# Patient Record
Sex: Female | Born: 1945 | Race: Black or African American | Hispanic: No | Marital: Single | Smoking: Current every day smoker
Health system: Southern US, Community
[De-identification: ages and names within clinical notes are randomized; demographics above are authoritative.]

## PROBLEM LIST (undated history)

## (undated) DIAGNOSIS — I10 Essential (primary) hypertension: Secondary | ICD-10-CM

## (undated) DIAGNOSIS — I2510 Atherosclerotic heart disease of native coronary artery without angina pectoris: Secondary | ICD-10-CM

## (undated) DIAGNOSIS — E785 Hyperlipidemia, unspecified: Secondary | ICD-10-CM

## (undated) DIAGNOSIS — E663 Overweight: Secondary | ICD-10-CM

## (undated) DIAGNOSIS — I6350 Cerebral infarction due to unspecified occlusion or stenosis of unspecified cerebral artery: Secondary | ICD-10-CM

## (undated) DIAGNOSIS — Z720 Tobacco use: Secondary | ICD-10-CM

## (undated) DIAGNOSIS — E119 Type 2 diabetes mellitus without complications: Secondary | ICD-10-CM

## (undated) HISTORY — PX: CHOLECYSTECTOMY: SHX55

## (undated) HISTORY — DX: Tobacco use: Z72.0

## (undated) HISTORY — DX: Overweight: E66.3

## (undated) HISTORY — PX: BACK SURGERY: SHX140

## (undated) HISTORY — DX: Cerebral infarction due to unspecified occlusion or stenosis of unspecified cerebral artery: I63.50

## (undated) HISTORY — DX: Essential (primary) hypertension: I10

## (undated) HISTORY — DX: Hyperlipidemia, unspecified: E78.5

## (undated) HISTORY — DX: Type 2 diabetes mellitus without complications: E11.9

## (undated) HISTORY — DX: Atherosclerotic heart disease of native coronary artery without angina pectoris: I25.10

## (undated) HISTORY — PX: ABDOMINAL HYSTERECTOMY: SHX81

---

## 2000-09-30 ENCOUNTER — Emergency Department (HOSPITAL_COMMUNITY): Admission: EM | Admit: 2000-09-30 | Discharge: 2000-09-30 | Payer: Self-pay | Admitting: Emergency Medicine

## 2001-11-11 ENCOUNTER — Ambulatory Visit (HOSPITAL_BASED_OUTPATIENT_CLINIC_OR_DEPARTMENT_OTHER): Admission: RE | Admit: 2001-11-11 | Discharge: 2001-11-11 | Payer: Self-pay | Admitting: Orthopedic Surgery

## 2001-12-23 ENCOUNTER — Encounter: Payer: Self-pay | Admitting: Emergency Medicine

## 2001-12-23 ENCOUNTER — Emergency Department (HOSPITAL_COMMUNITY): Admission: EM | Admit: 2001-12-23 | Discharge: 2001-12-23 | Payer: Self-pay | Admitting: Emergency Medicine

## 2002-03-18 HISTORY — PX: EXPLORATORY LAPAROTOMY: SUR591

## 2002-04-01 ENCOUNTER — Ambulatory Visit: Admission: RE | Admit: 2002-04-01 | Discharge: 2002-04-01 | Payer: Self-pay | Admitting: Gynecology

## 2002-04-04 ENCOUNTER — Encounter: Payer: Self-pay | Admitting: Gynecologic Oncology

## 2002-04-08 ENCOUNTER — Encounter (INDEPENDENT_AMBULATORY_CARE_PROVIDER_SITE_OTHER): Payer: Self-pay

## 2002-04-08 ENCOUNTER — Inpatient Hospital Stay (HOSPITAL_COMMUNITY): Admission: RE | Admit: 2002-04-08 | Discharge: 2002-04-11 | Payer: Self-pay | Admitting: Gynecologic Oncology

## 2002-04-09 ENCOUNTER — Encounter: Payer: Self-pay | Admitting: Internal Medicine

## 2002-04-15 ENCOUNTER — Encounter: Payer: Self-pay | Admitting: Emergency Medicine

## 2002-04-15 ENCOUNTER — Encounter (INDEPENDENT_AMBULATORY_CARE_PROVIDER_SITE_OTHER): Payer: Self-pay | Admitting: *Deleted

## 2002-04-15 ENCOUNTER — Inpatient Hospital Stay (HOSPITAL_COMMUNITY): Admission: EM | Admit: 2002-04-15 | Discharge: 2002-05-07 | Payer: Self-pay | Admitting: Emergency Medicine

## 2002-05-01 ENCOUNTER — Encounter: Payer: Self-pay | Admitting: General Surgery

## 2002-05-02 ENCOUNTER — Encounter: Payer: Self-pay | Admitting: General Surgery

## 2002-05-04 ENCOUNTER — Encounter: Payer: Self-pay | Admitting: General Surgery

## 2002-05-08 ENCOUNTER — Emergency Department (HOSPITAL_COMMUNITY): Admission: EM | Admit: 2002-05-08 | Discharge: 2002-05-08 | Payer: Self-pay | Admitting: Emergency Medicine

## 2003-07-17 ENCOUNTER — Emergency Department (HOSPITAL_COMMUNITY): Admission: EM | Admit: 2003-07-17 | Discharge: 2003-07-17 | Payer: Self-pay | Admitting: Emergency Medicine

## 2004-11-03 ENCOUNTER — Emergency Department (HOSPITAL_COMMUNITY): Admission: EM | Admit: 2004-11-03 | Discharge: 2004-11-03 | Payer: Self-pay | Admitting: *Deleted

## 2005-01-16 HISTORY — PX: CORONARY ARTERY BYPASS GRAFT: SHX141

## 2005-01-16 HISTORY — PX: CAROTID ENDARTERECTOMY: SUR193

## 2005-02-08 ENCOUNTER — Inpatient Hospital Stay (HOSPITAL_COMMUNITY): Admission: EM | Admit: 2005-02-08 | Discharge: 2005-02-18 | Payer: Self-pay | Admitting: Emergency Medicine

## 2005-02-14 ENCOUNTER — Encounter (INDEPENDENT_AMBULATORY_CARE_PROVIDER_SITE_OTHER): Payer: Self-pay | Admitting: *Deleted

## 2005-02-20 ENCOUNTER — Emergency Department (HOSPITAL_COMMUNITY): Admission: EM | Admit: 2005-02-20 | Discharge: 2005-02-20 | Payer: Self-pay | Admitting: Emergency Medicine

## 2005-08-30 ENCOUNTER — Inpatient Hospital Stay (HOSPITAL_COMMUNITY): Admission: EM | Admit: 2005-08-30 | Discharge: 2005-09-02 | Payer: Self-pay | Admitting: Emergency Medicine

## 2006-07-16 ENCOUNTER — Observation Stay (HOSPITAL_COMMUNITY): Admission: EM | Admit: 2006-07-16 | Discharge: 2006-07-17 | Payer: Self-pay | Admitting: Emergency Medicine

## 2007-06-17 ENCOUNTER — Encounter: Admission: RE | Admit: 2007-06-17 | Discharge: 2007-06-17 | Payer: Self-pay | Admitting: Internal Medicine

## 2007-07-01 ENCOUNTER — Emergency Department (HOSPITAL_COMMUNITY): Admission: EM | Admit: 2007-07-01 | Discharge: 2007-07-01 | Payer: Self-pay | Admitting: Family Medicine

## 2007-07-05 ENCOUNTER — Ambulatory Visit: Payer: Self-pay | Admitting: Vascular Surgery

## 2007-07-18 ENCOUNTER — Encounter (INDEPENDENT_AMBULATORY_CARE_PROVIDER_SITE_OTHER): Payer: Self-pay | Admitting: *Deleted

## 2007-07-22 ENCOUNTER — Ambulatory Visit: Payer: Self-pay | Admitting: Vascular Surgery

## 2007-07-22 ENCOUNTER — Ambulatory Visit (HOSPITAL_COMMUNITY): Admission: RE | Admit: 2007-07-22 | Discharge: 2007-07-22 | Payer: Self-pay | Admitting: Vascular Surgery

## 2007-11-11 ENCOUNTER — Emergency Department (HOSPITAL_COMMUNITY): Admission: EM | Admit: 2007-11-11 | Discharge: 2007-11-11 | Payer: Self-pay | Admitting: Emergency Medicine

## 2007-11-29 ENCOUNTER — Emergency Department (HOSPITAL_COMMUNITY): Admission: EM | Admit: 2007-11-29 | Discharge: 2007-11-29 | Payer: Self-pay | Admitting: Emergency Medicine

## 2008-05-01 ENCOUNTER — Telehealth (INDEPENDENT_AMBULATORY_CARE_PROVIDER_SITE_OTHER): Payer: Self-pay | Admitting: *Deleted

## 2008-05-04 ENCOUNTER — Telehealth (INDEPENDENT_AMBULATORY_CARE_PROVIDER_SITE_OTHER): Payer: Self-pay | Admitting: *Deleted

## 2008-05-11 ENCOUNTER — Ambulatory Visit: Payer: Self-pay | Admitting: *Deleted

## 2008-05-11 DIAGNOSIS — E785 Hyperlipidemia, unspecified: Secondary | ICD-10-CM | POA: Insufficient documentation

## 2008-05-11 HISTORY — DX: Hyperlipidemia, unspecified: E78.5

## 2008-05-11 LAB — CONVERTED CEMR LAB
ALT: 13 units/L (ref 0–35)
Calcium: 10 mg/dL (ref 8.4–10.5)
Chloride: 104 meq/L (ref 96–112)
Creatinine, Urine: 300 mg/dL
Glucose, Bld: 131 mg/dL — ABNORMAL HIGH (ref 70–99)
Microalb, Ur: 13.4 mg/dL — ABNORMAL HIGH (ref 0.00–1.89)
Potassium: 4.2 meq/L (ref 3.5–5.3)
Sodium: 142 meq/L (ref 135–145)
TSH: 1.772 microintl units/mL (ref 0.350–4.50)
Triglycerides: 134 mg/dL (ref <150)

## 2008-05-12 ENCOUNTER — Encounter (INDEPENDENT_AMBULATORY_CARE_PROVIDER_SITE_OTHER): Payer: Self-pay | Admitting: *Deleted

## 2008-05-15 DIAGNOSIS — E669 Obesity, unspecified: Secondary | ICD-10-CM

## 2008-05-15 DIAGNOSIS — E119 Type 2 diabetes mellitus without complications: Secondary | ICD-10-CM

## 2008-05-15 DIAGNOSIS — E1169 Type 2 diabetes mellitus with other specified complication: Secondary | ICD-10-CM | POA: Insufficient documentation

## 2008-05-15 DIAGNOSIS — I10 Essential (primary) hypertension: Secondary | ICD-10-CM

## 2008-05-15 DIAGNOSIS — I2510 Atherosclerotic heart disease of native coronary artery without angina pectoris: Secondary | ICD-10-CM

## 2008-05-15 HISTORY — DX: Atherosclerotic heart disease of native coronary artery without angina pectoris: I25.10

## 2008-05-15 HISTORY — DX: Essential (primary) hypertension: I10

## 2008-05-15 HISTORY — DX: Type 2 diabetes mellitus without complications: E11.9

## 2008-05-25 ENCOUNTER — Encounter (INDEPENDENT_AMBULATORY_CARE_PROVIDER_SITE_OTHER): Payer: Self-pay | Admitting: *Deleted

## 2008-05-28 ENCOUNTER — Ambulatory Visit: Payer: Self-pay | Admitting: *Deleted

## 2008-06-08 ENCOUNTER — Telehealth (INDEPENDENT_AMBULATORY_CARE_PROVIDER_SITE_OTHER): Payer: Self-pay | Admitting: *Deleted

## 2008-06-09 ENCOUNTER — Telehealth (INDEPENDENT_AMBULATORY_CARE_PROVIDER_SITE_OTHER): Payer: Self-pay | Admitting: *Deleted

## 2008-06-23 ENCOUNTER — Encounter (INDEPENDENT_AMBULATORY_CARE_PROVIDER_SITE_OTHER): Payer: Self-pay | Admitting: *Deleted

## 2008-06-30 ENCOUNTER — Ambulatory Visit: Payer: Self-pay | Admitting: *Deleted

## 2008-06-30 DIAGNOSIS — I4581 Long QT syndrome: Secondary | ICD-10-CM | POA: Insufficient documentation

## 2008-06-30 LAB — CONVERTED CEMR LAB
AST: 13 units/L (ref 0–37)
Alkaline Phosphatase: 76 units/L (ref 39–117)
BUN: 12 mg/dL (ref 6–23)
CO2: 25 meq/L (ref 19–32)
Calcium: 9 mg/dL (ref 8.4–10.5)
Creatinine, Ser: 0.84 mg/dL (ref 0.40–1.20)
HCT: 38.5 % (ref 36.0–46.0)
Hemoglobin: 12.3 g/dL (ref 12.0–15.0)
MCV: 98.2 fL (ref 78.0–100.0)
RBC: 3.92 M/uL (ref 3.87–5.11)
Total Protein: 6.4 g/dL (ref 6.0–8.3)

## 2008-07-10 ENCOUNTER — Telehealth: Payer: Self-pay | Admitting: *Deleted

## 2008-07-16 ENCOUNTER — Inpatient Hospital Stay (HOSPITAL_COMMUNITY): Admission: RE | Admit: 2008-07-16 | Discharge: 2008-07-20 | Payer: Self-pay | Admitting: Orthopedic Surgery

## 2008-07-21 ENCOUNTER — Telehealth (INDEPENDENT_AMBULATORY_CARE_PROVIDER_SITE_OTHER): Payer: Self-pay | Admitting: *Deleted

## 2008-10-20 ENCOUNTER — Ambulatory Visit: Payer: Self-pay | Admitting: *Deleted

## 2008-10-20 DIAGNOSIS — G609 Hereditary and idiopathic neuropathy, unspecified: Secondary | ICD-10-CM | POA: Insufficient documentation

## 2008-10-20 LAB — CONVERTED CEMR LAB
Blood Glucose, Fingerstick: 179
Hgb A1c MFr Bld: 5.9 %

## 2008-10-21 LAB — CONVERTED CEMR LAB
Basophils Relative: 0 % (ref 0–1)
Chloride: 108 meq/L (ref 96–112)
Creatinine, Ser: 0.77 mg/dL (ref 0.40–1.20)
Eosinophils Absolute: 0 10*3/uL (ref 0.0–0.7)
Eosinophils Relative: 0 % (ref 0–5)
HCT: 38.9 % (ref 36.0–46.0)
Lymphs Abs: 2.1 10*3/uL (ref 0.7–4.0)
MCHC: 31.1 g/dL (ref 30.0–36.0)
MCV: 94.6 fL (ref 78.0–100.0)
Neutro Abs: 6.9 10*3/uL (ref 1.7–7.7)
Neutrophils Relative %: 70 % (ref 43–77)
Platelets: 311 10*3/uL (ref 150–400)
Potassium: 4.1 meq/L (ref 3.5–5.3)
RBC: 4.11 M/uL (ref 3.87–5.11)
RDW: 16.5 % — ABNORMAL HIGH (ref 11.5–15.5)
Sodium: 144 meq/L (ref 135–145)

## 2008-11-03 ENCOUNTER — Ambulatory Visit: Payer: Self-pay | Admitting: *Deleted

## 2008-11-03 LAB — CONVERTED CEMR LAB: OCCULT 1: NEGATIVE

## 2008-11-04 LAB — CONVERTED CEMR LAB
Calcium: 9.7 mg/dL (ref 8.4–10.5)
Chloride: 105 meq/L (ref 96–112)
Sodium: 141 meq/L (ref 135–145)

## 2008-11-05 ENCOUNTER — Encounter (INDEPENDENT_AMBULATORY_CARE_PROVIDER_SITE_OTHER): Payer: Self-pay | Admitting: *Deleted

## 2008-11-17 ENCOUNTER — Telehealth: Payer: Self-pay | Admitting: *Deleted

## 2008-12-10 ENCOUNTER — Telehealth: Payer: Self-pay | Admitting: *Deleted

## 2008-12-14 ENCOUNTER — Encounter (INDEPENDENT_AMBULATORY_CARE_PROVIDER_SITE_OTHER): Payer: Self-pay | Admitting: *Deleted

## 2008-12-14 ENCOUNTER — Ambulatory Visit: Payer: Self-pay | Admitting: Internal Medicine

## 2008-12-14 LAB — CONVERTED CEMR LAB
CO2: 23 meq/L (ref 19–32)
Calcium: 9.8 mg/dL (ref 8.4–10.5)
Chloride: 107 meq/L (ref 96–112)
Glucose, Bld: 125 mg/dL — ABNORMAL HIGH (ref 70–99)
Sodium: 143 meq/L (ref 135–145)

## 2009-04-21 ENCOUNTER — Telehealth: Payer: Self-pay | Admitting: *Deleted

## 2009-04-21 ENCOUNTER — Telehealth: Payer: Self-pay | Admitting: Internal Medicine

## 2009-04-21 ENCOUNTER — Ambulatory Visit: Payer: Self-pay | Admitting: Internal Medicine

## 2009-04-21 LAB — CONVERTED CEMR LAB: Blood Glucose, Fingerstick: 233

## 2009-04-22 ENCOUNTER — Ambulatory Visit: Payer: Self-pay | Admitting: Internal Medicine

## 2009-04-22 ENCOUNTER — Telehealth: Payer: Self-pay | Admitting: *Deleted

## 2009-04-22 ENCOUNTER — Encounter: Payer: Self-pay | Admitting: Internal Medicine

## 2009-04-22 LAB — CONVERTED CEMR LAB
AST: 15 units/L (ref 0–37)
Alkaline Phosphatase: 125 units/L — ABNORMAL HIGH (ref 39–117)
BUN: 21 mg/dL (ref 6–23)
Bilirubin, Direct: 0.1 mg/dL (ref 0.0–0.3)
Blood Glucose, Fingerstick: 312
Chloride: 95 meq/L — ABNORMAL LOW (ref 96–112)
Indirect Bilirubin: 0.5 mg/dL (ref 0.0–0.9)
Magnesium: 2 mg/dL (ref 1.5–2.5)
Potassium: 4.4 meq/L (ref 3.5–5.3)
Sodium: 136 meq/L (ref 135–145)
Total Bilirubin: 0.6 mg/dL (ref 0.3–1.2)

## 2009-05-03 ENCOUNTER — Ambulatory Visit: Payer: Self-pay | Admitting: Internal Medicine

## 2009-05-05 DIAGNOSIS — N189 Chronic kidney disease, unspecified: Secondary | ICD-10-CM | POA: Insufficient documentation

## 2009-05-06 LAB — CONVERTED CEMR LAB
BUN: 30 mg/dL — ABNORMAL HIGH (ref 6–23)
Calcium: 10.1 mg/dL (ref 8.4–10.5)
Creatinine, Ser: 1.29 mg/dL — ABNORMAL HIGH (ref 0.40–1.20)
HCT: 40.7 % (ref 36.0–46.0)
HDL: 36 mg/dL — ABNORMAL LOW (ref >39)
Hemoglobin: 13.3 g/dL (ref 12.0–15.0)
LDL Cholesterol: 87 mg/dL (ref 0–99)
MCHC: 32.7 g/dL (ref 30.0–36.0)
MCV: 98.1 fL (ref >=78.0)
Platelets: 332 10*3/uL (ref 150–400)
RDW: 12.3 % (ref 11.5–15.5)
Triglycerides: 345 mg/dL — ABNORMAL HIGH (ref <150)

## 2009-05-10 ENCOUNTER — Encounter: Payer: Self-pay | Admitting: Internal Medicine

## 2009-05-11 ENCOUNTER — Telehealth: Payer: Self-pay | Admitting: *Deleted

## 2009-05-14 ENCOUNTER — Encounter: Payer: Self-pay | Admitting: Internal Medicine

## 2009-05-17 ENCOUNTER — Ambulatory Visit: Payer: Self-pay | Admitting: Internal Medicine

## 2009-05-17 ENCOUNTER — Encounter: Payer: Self-pay | Admitting: Internal Medicine

## 2009-05-17 LAB — CONVERTED CEMR LAB
CO2: 24 meq/L (ref 19–32)
Calcium: 9.7 mg/dL (ref 8.4–10.5)
Glucose, Bld: 188 mg/dL — ABNORMAL HIGH (ref 70–99)
Magnesium: 1.9 mg/dL (ref 1.5–2.5)
Potassium: 4.5 meq/L (ref 3.5–5.3)
Sodium: 142 meq/L (ref 135–145)

## 2009-05-23 ENCOUNTER — Inpatient Hospital Stay (HOSPITAL_COMMUNITY): Admission: EM | Admit: 2009-05-23 | Discharge: 2009-05-25 | Payer: Self-pay | Admitting: Emergency Medicine

## 2009-05-27 ENCOUNTER — Ambulatory Visit: Payer: Self-pay | Admitting: Infectious Diseases

## 2009-05-27 LAB — CONVERTED CEMR LAB
CO2: 23 meq/L (ref 19–32)
Calcium: 9.6 mg/dL (ref 8.4–10.5)
Chloride: 104 meq/L (ref 96–112)
Creatinine, Ser: 1.08 mg/dL (ref 0.40–1.20)
Glucose, Bld: 78 mg/dL (ref 70–99)
Sodium: 142 meq/L (ref 135–145)

## 2009-06-23 ENCOUNTER — Telehealth: Payer: Self-pay | Admitting: Internal Medicine

## 2009-06-28 ENCOUNTER — Ambulatory Visit: Payer: Self-pay | Admitting: Internal Medicine

## 2009-06-30 ENCOUNTER — Telehealth (INDEPENDENT_AMBULATORY_CARE_PROVIDER_SITE_OTHER): Payer: Self-pay | Admitting: *Deleted

## 2009-07-15 ENCOUNTER — Encounter (INDEPENDENT_AMBULATORY_CARE_PROVIDER_SITE_OTHER): Payer: Self-pay | Admitting: Internal Medicine

## 2009-07-15 ENCOUNTER — Ambulatory Visit: Payer: Self-pay | Admitting: Internal Medicine

## 2009-09-15 ENCOUNTER — Encounter: Payer: Self-pay | Admitting: Internal Medicine

## 2009-09-15 ENCOUNTER — Inpatient Hospital Stay (HOSPITAL_COMMUNITY): Admission: EM | Admit: 2009-09-15 | Discharge: 2009-09-17 | Payer: Self-pay | Admitting: Emergency Medicine

## 2009-09-15 ENCOUNTER — Ambulatory Visit: Payer: Self-pay | Admitting: Vascular Surgery

## 2009-09-15 ENCOUNTER — Ambulatory Visit: Payer: Self-pay | Admitting: Infectious Diseases

## 2009-09-20 ENCOUNTER — Encounter: Payer: Self-pay | Admitting: Internal Medicine

## 2009-09-23 ENCOUNTER — Ambulatory Visit: Payer: Self-pay | Admitting: Internal Medicine

## 2009-09-23 DIAGNOSIS — I6350 Cerebral infarction due to unspecified occlusion or stenosis of unspecified cerebral artery: Secondary | ICD-10-CM | POA: Insufficient documentation

## 2009-09-23 HISTORY — DX: Cerebral infarction due to unspecified occlusion or stenosis of unspecified cerebral artery: I63.50

## 2009-09-23 LAB — CONVERTED CEMR LAB: Blood Glucose, Fingerstick: 195

## 2009-09-27 ENCOUNTER — Telehealth (INDEPENDENT_AMBULATORY_CARE_PROVIDER_SITE_OTHER): Payer: Self-pay | Admitting: Internal Medicine

## 2009-10-07 ENCOUNTER — Ambulatory Visit: Payer: Self-pay | Admitting: Internal Medicine

## 2009-10-07 LAB — CONVERTED CEMR LAB: Hgb A1c MFr Bld: 8.2 %

## 2009-10-08 ENCOUNTER — Encounter: Payer: Self-pay | Admitting: Licensed Clinical Social Worker

## 2009-10-08 ENCOUNTER — Telehealth: Payer: Self-pay | Admitting: Licensed Clinical Social Worker

## 2009-10-08 ENCOUNTER — Encounter (INDEPENDENT_AMBULATORY_CARE_PROVIDER_SITE_OTHER): Payer: Self-pay | Admitting: Internal Medicine

## 2009-10-11 ENCOUNTER — Encounter (INDEPENDENT_AMBULATORY_CARE_PROVIDER_SITE_OTHER): Payer: Self-pay | Admitting: Internal Medicine

## 2009-10-11 ENCOUNTER — Telehealth: Payer: Self-pay | Admitting: Licensed Clinical Social Worker

## 2009-10-14 ENCOUNTER — Telehealth (INDEPENDENT_AMBULATORY_CARE_PROVIDER_SITE_OTHER): Payer: Self-pay | Admitting: Internal Medicine

## 2009-10-14 LAB — CONVERTED CEMR LAB
ALT: 14 units/L (ref 0–35)
AST: 14 units/L (ref 0–37)
Albumin: 4.5 g/dL (ref 3.5–5.2)
Alkaline Phosphatase: 102 units/L (ref 39–117)
Calcium: 9.8 mg/dL (ref 8.4–10.5)
Chloride: 103 meq/L (ref 96–112)
LDL Cholesterol: 156 mg/dL — ABNORMAL HIGH (ref 0–99)
Potassium: 4.4 meq/L (ref 3.5–5.3)
Sodium: 140 meq/L (ref 135–145)
Total Protein: 7.5 g/dL (ref 6.0–8.3)

## 2009-10-15 ENCOUNTER — Encounter (INDEPENDENT_AMBULATORY_CARE_PROVIDER_SITE_OTHER): Payer: Self-pay | Admitting: Internal Medicine

## 2009-10-18 ENCOUNTER — Encounter (INDEPENDENT_AMBULATORY_CARE_PROVIDER_SITE_OTHER): Payer: Self-pay | Admitting: Internal Medicine

## 2009-10-22 ENCOUNTER — Ambulatory Visit: Payer: Self-pay | Admitting: Internal Medicine

## 2009-10-27 ENCOUNTER — Telehealth (INDEPENDENT_AMBULATORY_CARE_PROVIDER_SITE_OTHER): Payer: Self-pay | Admitting: Internal Medicine

## 2009-10-29 ENCOUNTER — Encounter (INDEPENDENT_AMBULATORY_CARE_PROVIDER_SITE_OTHER): Payer: Self-pay | Admitting: Internal Medicine

## 2009-11-01 ENCOUNTER — Encounter (INDEPENDENT_AMBULATORY_CARE_PROVIDER_SITE_OTHER): Payer: Self-pay | Admitting: Internal Medicine

## 2009-11-05 ENCOUNTER — Encounter (INDEPENDENT_AMBULATORY_CARE_PROVIDER_SITE_OTHER): Payer: Self-pay | Admitting: Internal Medicine

## 2009-11-12 ENCOUNTER — Encounter (INDEPENDENT_AMBULATORY_CARE_PROVIDER_SITE_OTHER): Payer: Self-pay | Admitting: Internal Medicine

## 2009-11-22 ENCOUNTER — Ambulatory Visit: Payer: Self-pay | Admitting: Internal Medicine

## 2009-11-22 ENCOUNTER — Encounter: Payer: Self-pay | Admitting: Internal Medicine

## 2009-11-22 ENCOUNTER — Inpatient Hospital Stay (HOSPITAL_COMMUNITY): Admission: AD | Admit: 2009-11-22 | Discharge: 2009-11-23 | Payer: Self-pay | Admitting: Internal Medicine

## 2009-11-22 ENCOUNTER — Ambulatory Visit: Payer: Self-pay | Admitting: Infectious Diseases

## 2009-11-22 LAB — CONVERTED CEMR LAB
Blood Glucose, Fingerstick: 215
Hgb A1c MFr Bld: 8.5 %

## 2009-11-23 ENCOUNTER — Encounter: Payer: Self-pay | Admitting: Internal Medicine

## 2009-12-15 ENCOUNTER — Telehealth (INDEPENDENT_AMBULATORY_CARE_PROVIDER_SITE_OTHER): Payer: Self-pay | Admitting: *Deleted

## 2009-12-17 ENCOUNTER — Ambulatory Visit (HOSPITAL_COMMUNITY): Admission: RE | Admit: 2009-12-17 | Discharge: 2009-12-17 | Payer: Self-pay | Admitting: Internal Medicine

## 2009-12-17 ENCOUNTER — Ambulatory Visit: Payer: Self-pay | Admitting: Internal Medicine

## 2009-12-17 LAB — CONVERTED CEMR LAB
Basophils Relative: 1 % (ref 0–1)
CO2: 27 meq/L (ref 19–32)
Calcium: 9.7 mg/dL (ref 8.4–10.5)
Hemoglobin: 11.4 g/dL — ABNORMAL LOW (ref 12.0–15.0)
Lymphocytes Relative: 17 % (ref 12–46)
Lymphs Abs: 1.9 10*3/uL (ref 0.7–4.0)
MCHC: 33.7 g/dL (ref 30.0–36.0)
Monocytes Relative: 6 % (ref 3–12)
Neutro Abs: 8.4 10*3/uL — ABNORMAL HIGH (ref 1.7–7.7)
Neutrophils Relative %: 76 % (ref 43–77)
Potassium: 4.2 meq/L (ref 3.5–5.3)
RBC: 3.44 M/uL — ABNORMAL LOW (ref 3.87–5.11)
Sodium: 140 meq/L (ref 135–145)
WBC: 11 10*3/uL — ABNORMAL HIGH (ref 4.0–10.5)

## 2009-12-28 ENCOUNTER — Ambulatory Visit: Payer: Self-pay | Admitting: Internal Medicine

## 2009-12-28 ENCOUNTER — Encounter (INDEPENDENT_AMBULATORY_CARE_PROVIDER_SITE_OTHER): Payer: Self-pay | Admitting: Internal Medicine

## 2009-12-30 ENCOUNTER — Telehealth: Payer: Self-pay | Admitting: *Deleted

## 2009-12-30 LAB — CONVERTED CEMR LAB
Cholesterol: 199 mg/dL (ref 0–200)
Creatinine, Urine: 171.4 mg/dL
HDL: 36 mg/dL — ABNORMAL LOW (ref >39)
Microalb Creat Ratio: 11.7 mg/g (ref 0.0–30.0)

## 2010-01-12 ENCOUNTER — Telehealth (INDEPENDENT_AMBULATORY_CARE_PROVIDER_SITE_OTHER): Payer: Self-pay | Admitting: Internal Medicine

## 2010-01-13 ENCOUNTER — Telehealth (INDEPENDENT_AMBULATORY_CARE_PROVIDER_SITE_OTHER): Payer: Self-pay | Admitting: Internal Medicine

## 2010-01-17 ENCOUNTER — Ambulatory Visit: Payer: Self-pay | Admitting: Internal Medicine

## 2010-01-17 ENCOUNTER — Telehealth (INDEPENDENT_AMBULATORY_CARE_PROVIDER_SITE_OTHER): Payer: Self-pay | Admitting: Internal Medicine

## 2010-01-19 ENCOUNTER — Encounter (INDEPENDENT_AMBULATORY_CARE_PROVIDER_SITE_OTHER): Payer: Self-pay | Admitting: Internal Medicine

## 2010-01-19 ENCOUNTER — Telehealth (INDEPENDENT_AMBULATORY_CARE_PROVIDER_SITE_OTHER): Payer: Self-pay | Admitting: Internal Medicine

## 2010-02-24 ENCOUNTER — Ambulatory Visit: Payer: Self-pay | Admitting: Internal Medicine

## 2010-02-24 LAB — CONVERTED CEMR LAB
ALT: 14 units/L (ref 0–35)
AST: 12 units/L (ref 0–37)
Albumin: 4.6 g/dL (ref 3.5–5.2)
BUN: 13 mg/dL (ref 6–23)
Blood Glucose, Fingerstick: 92
Calcium: 9.9 mg/dL (ref 8.4–10.5)
Chloride: 106 meq/L (ref 96–112)
Hgb A1c MFr Bld: 6.5 %
Potassium: 4.2 meq/L (ref 3.5–5.3)

## 2010-03-03 ENCOUNTER — Telehealth: Payer: Self-pay | Admitting: Internal Medicine

## 2010-03-10 ENCOUNTER — Ambulatory Visit: Payer: Self-pay | Admitting: Internal Medicine

## 2010-03-17 ENCOUNTER — Emergency Department (HOSPITAL_COMMUNITY): Admission: EM | Admit: 2010-03-17 | Discharge: 2010-03-17 | Payer: Self-pay | Admitting: Emergency Medicine

## 2010-03-17 ENCOUNTER — Encounter: Payer: Self-pay | Admitting: Internal Medicine

## 2010-03-24 ENCOUNTER — Telehealth: Payer: Self-pay | Admitting: *Deleted

## 2010-03-25 ENCOUNTER — Telehealth: Payer: Self-pay | Admitting: Internal Medicine

## 2010-04-06 ENCOUNTER — Ambulatory Visit: Payer: Self-pay | Admitting: Internal Medicine

## 2010-04-06 ENCOUNTER — Encounter: Payer: Self-pay | Admitting: Ophthalmology

## 2010-04-06 ENCOUNTER — Observation Stay (HOSPITAL_COMMUNITY): Admission: EM | Admit: 2010-04-06 | Discharge: 2010-04-08 | Payer: Self-pay | Admitting: Emergency Medicine

## 2010-04-08 ENCOUNTER — Encounter: Payer: Self-pay | Admitting: Internal Medicine

## 2010-04-08 ENCOUNTER — Ambulatory Visit: Payer: Self-pay | Admitting: Vascular Surgery

## 2010-04-11 ENCOUNTER — Encounter: Payer: Self-pay | Admitting: Internal Medicine

## 2010-04-20 ENCOUNTER — Ambulatory Visit: Payer: Self-pay | Admitting: Internal Medicine

## 2010-04-20 LAB — CONVERTED CEMR LAB: Blood Glucose, Fingerstick: 173

## 2010-04-27 ENCOUNTER — Emergency Department (HOSPITAL_COMMUNITY): Admission: EM | Admit: 2010-04-27 | Discharge: 2010-04-27 | Payer: Self-pay | Admitting: Emergency Medicine

## 2010-05-11 ENCOUNTER — Encounter: Payer: Self-pay | Admitting: Internal Medicine

## 2010-06-06 ENCOUNTER — Encounter: Payer: Self-pay | Admitting: Internal Medicine

## 2010-10-09 ENCOUNTER — Encounter: Payer: Self-pay | Admitting: Orthopedic Surgery

## 2010-10-18 NOTE — Miscellaneous (Signed)
Summary: [facility]   Imported By: Margie Billet 12/17/2009 10:45:49  _____________________________________________________________________  External Attachment:    Type:   Image     Comment:   External Document

## 2010-10-18 NOTE — Assessment & Plan Note (Signed)
Summary: 2WK F/U/EST/VS   Vital Signs:  Patient profile:   65 year old female Height:      66 inches (167.64 cm) Weight:      201.9 pounds (91.77 kg) BMI:     32.71 Temp:     98.2 degrees F (36.78 degrees C) oral Pulse rate:   97 / minute BP sitting:   145 / 79  (right arm) Cuff size:   large  Vitals Entered By: Krystal Eaton Duncan Dull) (October 22, 2009 2:05 PM) CC: 2wk reck Is Patient Diabetic? Yes Did you bring your meter with you today? Yes Nutritional Status BMI of > 30 = obese  Have you ever been in a relationship where you felt threatened, hurt or afraid?No   Does patient need assistance? Functional Status Cook/clean, Shopping, Social activities Ambulation Impaired:Risk for fall Comments s/p stroke uses a cane   Primary Care Provider:  Mariea Stable MD  CC:  2wk reck.  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  She reports that she is doing well.  She has gotten the prescription for plavix filled.  She has started PT, OT and ST at her house.  Only concern today is that cbg's at have been high.  Depression History:      The patient denies a depressed mood most of the day and a diminished interest in her usual daily activities.        Comments:  somewhat depressed at times-not able to do the same things since cva.  Preventive Screening-Counseling & Management  Alcohol-Tobacco     Alcohol drinks/day: 0     Smoking Status: quit     Smoking Cessation Counseling: no     Packs/Day: <0.25     Year Quit: ?  Current Medications (verified): 1)  Plavix 75 Mg Tabs (Clopidogrel Bisulfate) .... Take One Tablet  Daily To Prevent Stroke. 2)  1st Choice Pen Needles 31g X 8 Mm Misc (Insulin Pen Needle) .... Use Each Day With Pen 3)  Hydrochlorothiazide 25 Mg Tabs (Hydrochlorothiazide) .... Take 1 Tablet By Mouth Once A Day 4)  Lisinopril 40 Mg Tabs (Lisinopril) .... Take 1 Tablet By Mouth Once A Day 5)  Relion Insulin Syringe 30g X 5/16" 1 Ml Misc (Insulin Syringe-Needle U-100) .... Use To Inject Two Times A Day. 6)  Metformin Hcl 1000 Mg Tabs (Metformin Hcl) .... Take 1 Tablet By Mouth Two Times A Day 7)  Bayer Contour Monitor W/device Kit (Blood Glucose Monitoring Suppl) .... Use To Check Sugar Two Times A Day Before Meals 8)  Wellbutrin Xl 150 Mg Xr24h-Tab (Bupropion Hcl) .... Take One Tablet By Mouth in The Morning Then Increase To One Tablet By Mouth Twice Daily After One Week. 9)  Norvasc 5 Mg Tabs (Amlodipine Besylate) .... Take 1 Tablet By Mouth Once A Day 10)  Zocor 40 Mg Tabs (Simvastatin) .... Take One Tablet Daily To Lower Cholesterol.  Allergies: 1)  ! Pcn 2)  ! Codeine  Review of Systems       per hpi  Physical Exam  General:  alert and well-developed.   Head:  normocephalic and atraumatic.   Eyes:  vision grossly intact, pupils equal, pupils round, and pupils reactive to light.   Mouth:  pharynx pink and moist.   Lungs:  normal respiratory effort and normal breath sounds.   Heart:  normal rate, regular rhythm, and no murmur.   Msk:  still with decreased strength on the left Pulses:  2+ Extremities:  no edema Neurologic:  alert & oriented X3.  still with slight facial droop and dysarthria, some weakness and gait instability.  no cognitive defect. Psych:  Oriented X3, memory intact for recent and remote, normally interactive, and good eye contact.     Impression & Recommendations:  Problem # 1:  CVA (ICD-434.91) Is taking plavix! is getting PT, OT and ST at home and is making good progress. need better BP, cholesterol, and DM control to prevent further events.  Her updated  medication list for this problem includes:    Plavix 75 Mg Tabs (Clopidogrel bisulfate) .[name]... Take one tablet daily to prevent stroke.  Problem # 2:  HYPERLIPIDEMIA (ICD-272.4) not taking zocor yet. She will pick it up today.  Her updated medication list for this problem includes:    Zocor 40 Mg Tabs (Simvastatin) .[name]... Take one tablet daily to lower cholesterol.  Problem # 3:  HYPERTENSION (ICD-401.9) bp is still not at goal. not taking norvasc.  She will pick it up today.  Her updated medication list for this problem includes:    Hydrochlorothiazide 25 Mg Tabs (Hydrochlorothiazide) .[name]... Take 1 tablet by mouth once a day    Lisinopril 40 Mg Tabs (Lisinopril) .[name]... Take 1 tablet by mouth once a day    Norvasc 5 Mg Tabs (Amlodipine besylate) .[name]... Take 1 tablet by mouth once a day  BP today: 145/79 Prior BP: 100/71 (10/07/2009)  Labs Reviewed: K+: 4.4 (10/08/2009) Creat: : 1.60 (10/08/2009)   Chol: 247 (10/08/2009)   HDL: 44 (10/08/2009)   LDL: 156 (10/08/2009)   TG: 234 (10/08/2009)  Problem # 4:  DIABETES MELLITUS, TYPE II (ICD-250.00) cbg's in the high 200's.  will add glipizide.  cont to check cbg's two times a day.  have hh nurse call if cbg's too high or low.   Her updated medication list for this problem includes:    Lisinopril 40 Mg Tabs (Lisinopril) .[name]... Take 1 tablet by mouth once a day    Metformin Hcl 1000 Mg Tabs (Metformin hcl) .[name]... Take 1 tablet by mouth two times a day    Glipizide Xl 10 Mg Xr24h-tab (Glipizide) .[name]... Take one tablet daily to lower blood sugars.  Orders: T-Urine Microalbumin w/creat. ratio [id number])  Problem # 5:  CIGARETTE SMOKER (ICD-305.1) no longer smoking  Problem # 6:  RENAL INSUFFICIENCY, ACUTE (ICD-585.9) last cr is 1.6 concerning as she is on metformin recheck at next appointment.  Complete Medication List: 1)  Plavix 75 Mg Tabs (Clopidogrel bisulfate) .... Take one tablet daily to prevent stroke. 2)  1st Choice Pen  Needles 31g X 8 Mm Misc (Insulin pen needle) .... Use each day with pen 3)  Hydrochlorothiazide 25 Mg Tabs (Hydrochlorothiazide) .... Take 1 tablet by mouth once a day 4)  Lisinopril 40 Mg Tabs (Lisinopril) .... Take 1 tablet by mouth once a day 5)  Relion Insulin Syringe 30g X 5/16" 1 Ml Misc (Insulin syringe-needle u-100) .... Use to inject two times a day. 6)  Metformin Hcl 1000 Mg Tabs (Metformin hcl) .... Take 1 tablet by mouth two times a day 7)  Bayer Contour Monitor W/device Kit (Blood glucose monitoring  suppl) .... Use to check sugar two times a day before meals 8)  Wellbutrin Xl 150 Mg Xr24h-tab (Bupropion hcl) .... Take one tablet by mouth in the morning then increase to one tablet by mouth twice daily after one week. 9)  Norvasc 5 Mg Tabs (Amlodipine besylate) .... Take 1 tablet by mouth once a day 10)  Zocor 40 Mg Tabs (Simvastatin) .... Take one tablet daily to lower cholesterol. 11)  Glipizide Xl 10 Mg Xr24h-tab (Glipizide) .... Take one tablet daily to lower blood sugars.   Patient Instructions: 1)  You have new medications at the pharmacy.  Please take as directed. 2)  Please schedule a follow-up appointment in 1 month. 3)  Please have your home health nurse call the clinic if your blood sugars are too high or too low. Prescriptions: NORVASC 5 MG TABS (AMLODIPINE BESYLATE) Take 1 tablet by mouth once a day  #30 x 3   Entered and Authorized by:   Elby Showers MD   Signed by:   Elby Showers MD on 10/22/2009   Method used:   Electronically to        [facility] [address] [id number]* (retail)       [address] [place], [place]  [address]       Ph: [phone]       Fax: [phone]   RxID:   [id number] ZOCOR 40 MG TABS (SIMVASTATIN) Take one tablet daily to lower cholesterol.  #32 x 6   Entered and Authorized by:   Elby Showers MD   Signed by:   Elby Showers MD on 10/22/2009   Method used:   Electronically to        [facility] [address] #[id number]*  (retail)       [address] [place], [place]  [address]       Ph: [phone]       Fax: [phone]   RxID:   [id number] GLIPIZIDE XL 10 MG XR24H-TAB (GLIPIZIDE) Take one tablet daily to lower blood sugars.  #32 x 6   Entered and Authorized by:   Elby Showers MD   Signed by:   Elby Showers MD on 10/22/2009   Method used:   Electronically to        [facility] [address] [facility] [address] [id number]* (retail)       [address] [place], [place]  [address]       Ph: [phone]       Fax: [phone]   RxID:   [id number]  Process Orders Check Orders Results:     [facility]: Order checked:     Elby Showers MD NOT AUTHORIZED TO ORDER Tests Sent for requisitioning (October 28, 2009 8:41 PM):     10/22/2009: [facility] -- T-Urine Microalbumin w/creat. ratio [[id number]] (signed)    Prevention & Chronic Care Immunizations   Influenza vaccine: refused  (07/15/2009)   Influenza vaccine deferral: Refused  (04/22/2009)    Tetanus booster: Not documented   Td booster deferral: Refused  (04/22/2009)    Pneumococcal vaccine: Not documented    H. zoster vaccine: Not documented   H. zoster vaccine deferral: Refused  (04/22/2009)  Colorectal Screening   Hemoccult: Not documented   Hemoccult action/deferral: Refused  (04/22/2009)    Colonoscopy: Not documented   Colonoscopy action/deferral: Refused  (04/22/2009)  Other Screening   Pap smear: Not documented  Pap smear action/deferral: Refused  (04/22/2009)    Mammogram: Not documented   Mammogram action/deferral: Refused  (04/22/2009)    DXA bone density scan: Not documented  Reports requested:   Last mammogram report requested.  Smoking status: quit  (10/22/2009)    Screening comments: last mam was [facility]  Diabetes Mellitus   HgbA1C: 8.2  (10/07/2009)   HgbA1C action/deferral: Deferred  (04/22/2009)    Eye exam: Glaucoma.     (05/10/2009)   Diabetic eye  exam action/deferral: Refused  (04/22/2009)   Eye exam due: 06/2009    Foot exam: yes  (09/23/2009)   Foot exam action/deferral: Do today   High risk foot: No  (07/15/2009)   Foot care education: Done  (07/15/2009)    Urine microalbumin/creatinine ratio: 44.7  (05/11/2008)   Urine microalbumin action/deferral: Ordered    Diabetes flowsheet reviewed?: Yes   Progress toward A1C goal: Unchanged  Lipids   Total Cholesterol: 247  (10/08/2009)   LDL: 156  (10/08/2009)   LDL Direct: Not documented   HDL: 44  (10/08/2009)   Triglycerides: 234  (10/08/2009)    SGOT (AST): 14  (10/08/2009)   SGPT (ALT): 14  (10/08/2009)   Alkaline phosphatase: 102  (10/08/2009)   Total bilirubin: 0.4  (10/08/2009)    Lipid flowsheet reviewed?: Yes   Progress toward LDL goal: Deteriorated  Hypertension   Last Blood Pressure: 145 / 79  (10/22/2009)   Serum creatinine: 1.60  (10/08/2009)   Serum potassium 4.4  (10/08/2009)    Hypertension flowsheet reviewed?: Yes   Progress toward BP goal: Unchanged  Self-Management Support :    Diabetes self-management support: Not documented    Hypertension self-management support: Not documented    Lipid self-management support: Not documented    Nursing Instructions: Request report of last mammogram
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
## 2010-10-18 NOTE — Progress Notes (Signed)
----   Converted from flag ---- ---- 12/30/2009 10:01 AM, Chinita Pester RN wrote: Pt. was called and instructed Zocor has been increased to 80mg ; to take 2 40mg  tablets of remaining pills.  Then new Rx has been electronic to the pharmacy per Dr. Clent Ridges.  ---- 12/30/2009 8:29 AM, Elby Showers MD wrote: Please call ms everett and let her know that her cholesterol is still high.  I have written for an increased dose of zocor.  The prescription is at the pharmacy.  She can use the remaining pills of the 40mg  dose by taking 2 of them a day and then start the 80mg  tablet.  thanks. ------------------------------

## 2010-10-18 NOTE — Assessment & Plan Note (Signed)
Summary: HFU/BUTCHER/PER DR. Claudette Laws ON SERVICE/DS   Vital Signs:  Patient profile:   65 year old female Height:      66 inches (167.64 cm) Weight:      198.2 pounds (90.09 kg) BMI:     32.11 Temp:     98.3 degrees F (36.83 degrees C) oral Pulse rate:   109 / minute BP sitting:   153 / 83  (left arm)  Vitals Entered By: Stanton Kidney Ditzler RN (April 20, 2010 3:08 PM) Is Patient Diabetic? Yes Did you bring your meter with you today? No Pain Assessment Patient in pain? yes     Location: right shoulder and arm Intensity: 10 Type: aching Onset of pain  past 4 months Nutritional Status BMI of > 30 = obese Nutritional Status Detail appetite good CBG Result 173  Have you ever been in a relationship where you felt threatened, hurt or afraid?denies   Does patient need assistance? Functional Status Self care Ambulation Impaired:Risk for fall Comments Uses a cane. Moving to daughter in [place] 05/05/10 - will have records faxed after finding a doctor.FMLA papers need to be completed. Refills on meds - Plavix too expensive.   Primary Care Asiel Chrostowski:  Mariea Stable MD   History of Present Illness: Patient presents for follow-up after hospitalization for sub-acute stroke.  Says she has noted some improvement in her weakness on her right side but no improvement in her speech since leaving the hospital.  Patient states that she is unabe to afford her plavix - when the patient was in the hospital we provided her with a coupon from the drug company for a 14 day supply and she filled this at her local pharmacy.  We also provided her with a prescription faxed to [facility] for plavix that was said to cost $60/3 month supply, however the patient states that she received a letter from [facility] stating that she will not be sent plavix or any of her other medicines..  Patient has been taking all of her medications except for the metoprolol which she states she never received from [facility].    Patient complains  of pain in her right shoulder since she fell on it prior right before her last hospital admission.  She was given ibuprofen on discharge to try for pain, but she has tried tramadol that she had at home and says that it helps for about an hour and then wears off.  The pain is a sharp, stabbing pain and then gets to a dull ache.  The pain is constant.  There is some improvement with heat and tramadol.  Movement makes it worse.  Patient states that her pain today is no better than when she was first injured.  During her hospital stay XRays of the shoulder were normal.  She worked with PT after discharge from the hospital but stopped because insurance wouldn't cover.  Patient is diabetic and checks her blood sugar every other day and they run 140s-160s.  Patient has h/o insomnia and would like medication for sleep; she says Ambien has worked in the past but was too expensive.  The patient is moving to [place] on August 18 to live with her daughter.  No fevers, CP, SOB, N/V, no constipation, no dysuria, no other joint pains, no headaches, no dizziness, no new weakness or numbness.  No trouble swallowing. Some stress incontinence occasionally.  Depression History:      The patient denies a depressed mood most of the day and a diminished  interest in her usual daily activities.         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 Take One Tablet Daily To Prevent Stroke. 2)  1st Choice Pen Needles 31g X 8 Mm Misc (Insulin Pen Needle) .... Use Each Day With Pen 3)  Lisinopril 40 Mg Tabs (Lisinopril) .... Take 1 Tablet By Mouth Once A Day 4)  Metformin Hcl 1000 Mg Tabs (Metformin Hcl) .... Take 1 Tablet By Mouth Two Times A Day 5)  Bayer Contour Monitor W/device Kit (Blood Glucose Monitoring Suppl) .... Use To Check Sugar Two Times A Day Before Meals 6)  Zocor 80 Mg Tabs (Simvastatin) .... Take One Tablet Daily To Decrease Cholesterol. 7)  Icy Hot 5 % Pads (Menthol (Topical Analgesic)) .... Use As Directed On Package. 8)  Aspirin 81 Mg Tabs (Aspirin) .... Take 1 Tablet By Mouth Once A Day 9)  Ibuprofen 800 Mg Tabs (Ibuprofen) .... Take 1 Tablet By Mouth Every 8 Hours As Needed For Shoulder Pain 10)  Metoprolol Tartrate 25 Mg Tabs (Metoprolol Tartrate) .... Take One Half Tablet By Mouth Twice Daily.  Allergies: 1)  ! Pcn 2)  ! 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)   Former Smoker - quit in 2006, smoked 1/2ppd x 30+ years Alcohol use-no Regular exercise-yes, walks her dog She has health insurance  Risk Factors: Alcohol Use: 0 (04/20/2010) Exercise: yes (04/20/2010)  Risk Factors: Smoking Status: quit (04/20/2010) Packs/Day: 0.5 (04/20/2010)  Past Medical History: s/p CVA January 2011 and June 2011  Coronary artery disease s/p CABGand PCI  in 2006 Glaucoma,  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  It is imperative that she continue to take Plavix daily, otherwise the likelihood of her having another stroke (with potentially much worse clinical outcome) is extremely high.  She has had issues with obtaining medicines from [facility], however after our nurse called and spoke with [facility] and [facility] spoke with the patient, it was determined that the patient was not getting her newest prescriptions, including the plavix because she owed money to [employer].  The patient was given the telephone number for [facility], and she is to call them today to pay her bill; if she has any trouble with this she is to call us at the clinic tomorrow.  The patient paid that amount today and new prescriptions were sent.  The patient was also given a sample box of Plavix today to give her adequate medicine until she can get her plavix in the mail.  The patient currently has 7 pills of plavix today.    Her updated medication list for this problem includes:    Plavix 75 Mg Tabs (Clopidogrel bisulfate) .[name]... Take one tablet daily to prevent stroke.    Aspirin 81 Mg Tabs (Aspirin) .[name]...  Take 1 tablet by mouth once a day  Problem # 2:  SHOULDER PAIN, RIGHT (ICD-719.41) Patient was sent home from the hospital with order for home PT. They came 3 times to her house but then told her that even though she would benefit from further work with them, they had to stop coming because of insurance issues.  We do not have records from physical therapy, but we will request these to see if we can further determine her need for PT and why her insurance did not cover further care.  In the meantime, we will provide the patient with a prescription for ibuprofen, which she tolerated in the hospital and helped with her shoulder pain there, and we will refer her to sports medicine to see if they have any other means of offering pain relief (injection perhaps) to tie the patient over until she can move to [place] and undergo further workup there.  Our next step for evaluation of her shoulder pain would be an MRI, however the patient does not tolerate MRIs well, and instead of having her imaged here and then follow-up in [place] (where they may need to repeat the study), we will defer further work-up to her [facility] in [place].  Her updated medication list for this problem includes:    Aspirin 81 Mg Tabs (Aspirin) .[name]... Take 1 tablet by mouth once a day    Ibuprofen 800 Mg Tabs (Ibuprofen) .[name]... Take 1 tablet by mouth every 8 hours as needed for shoulder pain  Orders: Sports Medicine (Sports Med)  Problem # 3:  CORONARY ARTERY DISEASE (ICD-414.00) Patient would benefit from addition of a beta blocker given her CAD.  It was started on discharge from the hospital, however, the patient never received that medication.  We will represcribe this today.   The following medications were removed from the medication list:    Metoprolol Tartrate 25 Mg Tabs (Metoprolol tartrate) .[name]... Take one half tablet by mouth twice daily Her updated medication list for this problem includes:    Plavix 75 Mg Tabs  (Clopidogrel bisulfate) .[name]... Take one tablet daily to prevent stroke.    Lisinopril 40 Mg Tabs (Lisinopril) .[name]... Take 1 tablet by mouth once a day    Aspirin 81 Mg Tabs (Aspirin) .[name]... Take 1 tablet by mouth once  a day    Metoprolol Tartrate 25 Mg Tabs (Metoprolol tartrate) .[name]... Take one half tablet by mouth twice daily.  Problem # 4:  INSOMNIA (ICD-780.52) The patient has a long-standing history of insomnia which in the past was helped with Ambien.  She, however, cannot afford ambien and requests a cheaper medicine.  Another option for her would be a low-dose benzo, however, the patient already has difficulty paying for her medications and though a benzo would be cheap, we would rather defer further complication of her medical regimen and increased cost of her medicines to a later date when she is more stable.  In the meantime, she was given instructions on appropriate sleep hygiene.   Problem # 5:  DIABETES MELLITUS, TYPE II (ICD-250.00) Last HgbA1c in June 2011 was 6.5.  Patient did not bring her meter with her today, however per her report she has had no low or high blood sugars.  We will continue her medications at the current dosing.  Her updated medication list for this problem includes:    Lisinopril 40 Mg Tabs (Lisinopril) .[name]... Take 1 tablet by mouth once a day    Metformin Hcl 1000 Mg Tabs (Metformin hcl) .[name]... Take 1 tablet by mouth two times a day    Aspirin 81 Mg Tabs (Aspirin) .[name]... Take 1 tablet by mouth once a day  Orders: Capillary Blood Glucose/CBG (16109)  Problem # 6:  HYPERTENSION (ICD-401.9) Patient would benefit from addition of a beta blocker given her CAD.  It was started on discharge from the hospital, however, the patient never received that medication.  We will represcribe this today.  Her BP today was above goal and she was tachycardic, so we expect she will be able to tolerate a low dose beta blocker.  The following medications were removed from the medication  list:    Metoprolol Tartrate 25 Mg Tabs (Metoprolol tartrate) .[name]... Take one half tablet by mouth twice daily Her updated medication list for this problem includes:    Lisinopril 40 Mg Tabs (Lisinopril) .[name]... Take 1 tablet by mouth once a day    Metoprolol Tartrate 25 Mg Tabs (Metoprolol tartrate) .[name]... Take one half tablet by mouth twice daily.  Complete Medication List: 1)  Plavix 75 Mg Tabs (Clopidogrel bisulfate) .... Take one tablet daily to prevent stroke. 2)  1st Choice Pen Needles 31g X 8 Mm Misc (Insulin pen needle) .... Use each day with pen 3)  Lisinopril 40 Mg Tabs (Lisinopril) .... Take 1 tablet by mouth once a day 4)  Metformin Hcl 1000 Mg Tabs (Metformin hcl) .... Take 1 tablet by mouth two times a day 5)  Bayer Contour Monitor W/device Kit (Blood glucose monitoring suppl) .... Use to check sugar two times a day before meals 6)  Zocor 80 Mg Tabs (Simvastatin) .... Take one tablet daily to decrease cholesterol. 7)  Icy Hot 5 % Pads (Menthol (topical analgesic)) .... Use as directed on package. 8)  Aspirin 81 Mg Tabs (Aspirin) .... Take 1 tablet by mouth once a day 9)  Ibuprofen 800 Mg Tabs (Ibuprofen) .... Take 1 tablet by mouth every 8 hours as needed for shoulder pain 10)  Metoprolol Tartrate 25 Mg Tabs (Metoprolol tartrate) .... Take one half tablet by mouth twice daily.  Patient Instructions: 1)  Please take your medicines as instructed.  A prescription for your plavix, metoprolol, and ibuprofen were sent to [facility] today.  You will need to make a payment for [facility] in order to  start receiving your medicines. 2)  It is VITAL that you take your plavix every day. 3)  For your shoulder we recommend trying ibuprofen.  You can also try using heat or ice. 4)  For sleep, we recommend that you try having a consistent before bed routine, avoid watching television for 2 hours before going to sleep, avoid eating or doing strenuous exercise for 3 hours prior to sleep.   5)  When you move to  [place] and establish care with a new doctor, you may call us and have [place] fax your health records to your new doctor. Prescriptions: IBUPROFEN 800 MG TABS (IBUPROFEN) Take 1 tablet by mouth every 8 hours as needed for shoulder pain  #90 x 1   Entered and Authorized by:   Danelle Berry, MD   Signed by:   Danelle Berry, MD on 04/20/2010   Method used:   Faxed to ...       [facility] (mail-order)             , [place]         Ph: [phone]       Fax: [phone]   RxID:   [id number] METOPROLOL TARTRATE 25 MG TABS (METOPROLOL TARTRATE) Take one half tablet by mouth twice daily.  #180 x 1   Entered and Authorized by:   Danelle Berry, MD   Signed by:   Danelle Berry, MD on 04/20/2010   Method used:   Faxed to ...       [facility] (mail-order)             , [place]         Ph: [phone]       Fax: [phone]   RxID:   [id number] PLAVIX 75 MG TABS (CLOPIDOGREL BISULFATE) Take one tablet daily to prevent stroke.  #90 x 1   Entered and Authorized by:   Danelle Berry, MD   Signed by:   Danelle Berry, MD on 04/20/2010   Method used:   Faxed to ...       [facility] (mail-order)             , [place]         Ph: [phone]       Fax: [phone]   RxID:   [id number]    Prevention & Chronic Care Immunizations   Influenza vaccine: refused  (07/15/2009)   Influenza vaccine deferral: Refused  (04/22/2009)    Tetanus booster: Not documented   Td booster deferral: Refused  (04/22/2009)    Pneumococcal vaccine: Not documented    H. zoster vaccine: Not documented   H. zoster vaccine deferral: Refused  (04/22/2009)  Colorectal Screening   Hemoccult: Not documented   Hemoccult action/deferral: Refused  (04/22/2009)    Colonoscopy: Not documented   Colonoscopy action/deferral: Refused  (04/22/2009)  Other Screening   Pap smear: Not documented   Pap smear action/deferral: Refused  (04/22/2009)    Mammogram: Not documented   Mammogram action/deferral: Refused   (04/22/2009)    DXA bone density scan: Not documented   Smoking status: quit  (04/20/2010)  Diabetes Mellitus   HgbA1C: 6.5  (02/24/2010)   HgbA1C action/deferral: Ordered  (02/24/2010)    Eye exam: Glaucoma.     (05/10/2009)   Diabetic eye exam action/deferral: Deferred  (04/20/2010)   Eye exam due: 06/2009    Foot exam: yes  (09/23/2009)   Foot exam action/deferral: Do today   High risk foot: No  (07/15/2009)  Foot care education: Done  (07/15/2009)    Urine microalbumin/creatinine ratio: 11.7  (12/28/2009)   Urine microalbumin action/deferral: Ordered    Diabetes flowsheet reviewed?: Yes   Progress toward A1C goal: At goal  Lipids   Total Cholesterol: 175  (02/24/2010)   Lipid panel action/deferral: Lipid Panel ordered   LDL: 90  (02/24/2010)   LDL Direct: Not documented   HDL: 43  (02/24/2010)   Triglycerides: 208  (02/24/2010)    SGOT (AST): 12  (02/24/2010)   BMP action: Ordered   SGPT (ALT): 14  (02/24/2010)   Alkaline phosphatase: 93  (02/24/2010)   Total bilirubin: 0.3  (02/24/2010)    Lipid flowsheet reviewed?: Yes   Progress toward LDL goal: Improved  Hypertension   Last Blood Pressure: 153 / 83  (04/20/2010)   Serum creatinine: 0.96  (02/24/2010)   BMP action: Ordered   Serum potassium 4.2  (02/24/2010)    Hypertension flowsheet reviewed?: Yes   Progress toward BP goal: Unchanged  Self-Management Support :   Personal Goals (by the next clinic visit) :     Personal A1C goal: 7  (12/17/2009)     Personal blood pressure goal: 130/80  (12/17/2009)     Personal LDL goal: 70  (12/17/2009)    Patient will work on the following items until the next clinic visit to reach self-care goals:     Medications and monitoring: take my medicines every day, check my blood sugar, bring all of my medications to every visit, examine my feet every day  (04/20/2010)     Eating: drink diet soda or water instead of juice or soda, eat more vegetables, use fresh or  frozen vegetables, eat foods that are low in salt, eat baked foods instead of fried foods, eat fruit for snacks and desserts, limit or avoid alcohol  (04/20/2010)     Activity: take a 30 minute walk every day  (04/20/2010)    Diabetes self-management support: Written self-care plan, Education handout, Resources for patients handout  (04/20/2010)   Diabetes care plan printed   Diabetes education handout printed    Hypertension self-management support: Written self-care plan, Education handout, Resources for patients handout  (04/20/2010)   Hypertension self-care plan printed.   Hypertension education handout printed    Lipid self-management support: Written self-care plan, Education handout, Resources for patients handout  (04/20/2010)   Lipid self-care plan printed.   Lipid education handout printed      Resource handout printed.   Appended Document: HFU/BUTCHER/PER DR. Claudette Laws ON SERVICE/DS I saw and examined Vanessa Calderon with Dr Claudette Laws. I agree with her hx, PE, and A/P. Dr Rogelia Boga

## 2010-10-18 NOTE — Assessment & Plan Note (Signed)
Summary: productive cough/gg   Vital Signs:  Patient profile:   65 year old female Height:      66 inches (167.64 cm) Weight:      137.6 pounds (88.55 kg) BMI:     22.29 Temp:     97.3 degrees F (36.28 degrees C) oral Pulse rate:   95 / minute BP sitting:   127 / 76  (left arm) Cuff size:   regular  Vitals Entered By: Theotis Barrio NT II (December 17, 2009 10:10 AM) CC: CONGESTION / RIGHT SIDE PAIN FOR ABOUT 2 DAYS / PATIENT STATES "FEELS LIKE HER LUNG HURT/ FLUID  /  MEDICATION REFILL,  Is Patient Diabetic? Yes Did you bring your meter with you today? No Pain Assessment Patient in pain? yes     Location: RIGHT SIDE Intensity:     6 Type: SORNESS Onset of pain  2 DAYS AGO Nutritional Status BMI of > 30 = obese  Have you ever been in a relationship where you felt threatened, hurt or afraid?No   Does patient need assistance? Functional Status Self care Ambulation Normal Comments MEDICATION REFILL /  RIFHT SIDE PAIN FOR ABOUT 2 DAYS   Primary Care Provider:  Mariea Stable MD  CC:  CONGESTION / RIGHT SIDE PAIN FOR ABOUT 2 DAYS / PATIENT STATES "FEELS LIKE HER LUNG HURT/ FLUID  /  MEDICATION REFILL and .  History of Present Illness: 65 yr old female presents with acute problem of chest pain and cough. Her pain is right sided rib cage pain worsened by deep breathing and coughing. Her cough is intermittent and not related to time of the day or exposure to allergens. She does not report any fevers, sick contact, dizziness or other focal weakness. She has not taken anything for pain. She got her flu shot this year. She does bring white mucous with cough.  She feels ok to go back home and take care of herself.   Depression History:      The patient denies a depressed mood most of the day and a diminished interest in her usual daily activities.         Preventive Screening-Counseling & Management  Alcohol-Tobacco     Alcohol drinks/day: 0     Smoking Status: quit  Smoking Cessation Counseling: no     Packs/Day: <0.25     Year Quit: ?  Caffeine-Diet-Exercise     Does Patient Exercise: no  Current Medications (verified): 1)  Plavix 75 Mg Tabs (Clopidogrel Bisulfate) .... Take One Tablet Daily To Prevent Stroke. 2)  1st Choice Pen Needles 31g X 8 Mm Misc (Insulin Pen Needle) .... Use Each Day With Pen 3)  Hydrochlorothiazide 25 Mg Tabs (Hydrochlorothiazide) .... Take 1 Tablet By Mouth Once A Day 4)  Lisinopril 40 Mg Tabs (Lisinopril) .... Take 1 Tablet By Mouth Once A Day 5)  Relion Insulin Syringe 30g X 5/16" 1 Ml Misc (Insulin Syringe-Needle U-100) .... Use To Inject Two Times A Day. 6)  Metformin Hcl 1000 Mg Tabs (Metformin Hcl) .... Take 1 Tablet By Mouth Two Times A Day 7)  Bayer Contour Monitor W/device Kit (Blood Glucose Monitoring Suppl) .... Use To Check Sugar Two Times A Day Before Meals 8)  Wellbutrin Xl 150 Mg Xr24h-Tab (Bupropion Hcl) .... Take One Tablet By Mouth in The Morning Then Increase To One Tablet By Mouth Twice Daily After One Week. 9)  Norvasc 5 Mg Tabs (Amlodipine Besylate) .... Take 1 Tablet By Mouth Once A  Day 10)  Zocor 40 Mg Tabs (Simvastatin) .... Take One Tablet Daily To Lower Cholesterol. 11)  Glipizide Xl 10 Mg Xr24h-Tab (Glipizide) .... Take One Tablet Daily To Lower Blood Sugars.  Allergies (verified): 1)  ! Pcn 2)  ! 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   Former Smoker Alcohol use-no Regular exercise-no She has health insurance  Risk Factors: Alcohol Use: 0 (12/17/2009) Exercise: no (12/17/2009)  Risk Factors: Smoking Status: quit (12/17/2009) Packs/Day: <0.25 (12/17/2009)  Review of Systems      See HPI  Physical Exam  General:  pale and disheveled.   Head:  normocephalic and atraumatic.   Eyes:  vision grossly intact, pupils equal, pupils round, and pupils reactive to light.   Mouth:  pharynx pink and dry Lungs:  normal respiratory effort and occassional crackles on right side. chest wall tenderness that is exaberated by deep palpation.    Heart:  normal rate, regular rhythm, and no murmur.   Abdomen:  soft, non-tender, normal bowel sounds, no distention, and no masses.   Msk:  still with decreased strength on the left Extremities:  no edema Neurologic:  alert & oriented X3.  still with slight facial droop and dysarthria, some weakness and gait instability.  no cognitive defect. Skin:  no suspicious lesions.   Psych:  Oriented X3, memory intact for recent and remote, normally interactive, and good eye contact.     Impression & Recommendations:  Problem # 1:  DIARRHEA (ICD-787.91) Assessment Improved  resolved at present. Did not come for hfu. will get BMET to follow up on hypokalemia.   Problem # 2:  COUGH (ICD-786.2) Assessment: New  started 2 days prior, possible viral vs. coummunity acquired pneumonia with pleuritis. May have small pleural effusion. No history of fall. No history of prior MVA. Will get CXR for better assessment. Also will get CBC.  If she has other signs of PNA will treat it with avelox. She has allergy to pcn and codiene. I will also provide her tylenol or tramadol for pain releif.  I will aboid NSAID given history of acute failure.   Problem # 3:  CVA (ICD-434.91) Assessment: Unchanged stable.NO  new deficits. Her updated medication list for this problem includes:    Plavix 75 Mg Tabs (Clopidogrel bisulfate) .[name]... Take one tablet daily to prevent stroke.  Her updated medication list for this problem includes:    Plavix 75 Mg Tabs (Clopidogrel bisulfate) .[name]... Take one tablet daily to prevent stroke.  Problem # 4:  HYPERTENSION (ICD-401.9) BP stable. No changes made. Will instruct her to not take ACE inhibitor if her oral intake goes down to prevent acute renal failure.  Her updated medication list for this problem includes:    Hydrochlorothiazide 25 Mg Tabs (Hydrochlorothiazide) .[name]... Take 1 tablet by mouth once a day    Lisinopril 40 Mg Tabs (Lisinopril) .[name]... Take 1 tablet by mouth once a day    Norvasc 5 Mg Tabs (Amlodipine besylate) .[name]... Take 1 tablet  by mouth once a day  BP today: 127/76 Prior BP: 155/83 (11/22/2009)  Labs Reviewed: K+: 4.4 (10/08/2009) Creat: : 1.60 (10/08/2009)   Chol: 247 (10/08/2009)   HDL: 44 (10/08/2009)   LDL: 156 (10/08/2009)   TG: 234 (10/08/2009)  Her updated medication list for this problem includes:    Hydrochlorothiazide 25 Mg Tabs (Hydrochlorothiazide) .[name]... Take 1 tablet by mouth once a day    Lisinopril 40 Mg Tabs (Lisinopril) .[name]... Take 1 tablet by mouth once a day    Norvasc 5 Mg Tabs (Amlodipine besylate) .[name]... Take 1 tablet by mouth once a day  Problem # 5:  DIABETES MELLITUS, TYPE II (ICD-250.00) will get glucose levels in herBMET. Did not focus on this issue given her acute sickness. She is in lot of discomfort and cant discuss her routine health issues at this time.  Her updated medication list for this problem includes:    Lisinopril 40 Mg Tabs (Lisinopril) .[name]... Take 1 tablet by mouth once a day    Metformin Hcl 1000 Mg Tabs (Metformin hcl) .[name]... Take 1 tablet by mouth two times a day    Glipizide Xl 10 Mg Xr24h-tab (Glipizide) .[name]... Take one tablet daily to lower blood sugars.  Labs Reviewed: Creat: 1.60 (10/08/2009)      Last Eye Exam: Glaucoma.    (05/10/2009) Reviewed HgBA1c results: 8.5 (11/22/2009)  8.2 (10/07/2009)  Her updated medication list for this problem includes:    Lisinopril 40 Mg Tabs (Lisinopril) .[name]... Take 1 tablet by mouth once a day    Metformin Hcl 1000 Mg Tabs (Metformin hcl) .[name]... Take 1 tablet by mouth two times a day    Glipizide Xl 10 Mg Xr24h-tab (Glipizide) .[name]... Take one tablet daily to lower blood sugars.  Complete Medication List: 1)  Plavix 75 Mg Tabs (Clopidogrel bisulfate) .... Take one tablet daily to prevent stroke. 2)  1st Choice Pen Needles 31g X 8 Mm Misc (Insulin pen needle) .... Use each day with pen 3)  Hydrochlorothiazide 25 Mg Tabs (Hydrochlorothiazide) .... Take 1 tablet by mouth once a day 4)  Lisinopril 40 Mg Tabs (Lisinopril) .... Take 1 tablet by mouth once a day 5)  Relion Insulin Syringe 30g X 5/16" 1 Ml Misc (Insulin syringe-needle u-100) .... Use to inject two times a day. 6)  Metformin Hcl 1000 Mg Tabs (Metformin hcl) .... Take 1 tablet by mouth two times a day 7)  Bayer Contour Monitor W/device Kit (Blood glucose monitoring suppl) .... Use to check sugar two times a day before meals 8)  Wellbutrin Xl 150 Mg Xr24h-tab (Bupropion hcl) .... Take one tablet by mouth in the morning then increase to one tablet by mouth twice daily after one week. 9)  Norvasc 5 Mg Tabs (Amlodipine besylate) .... Take 1 tablet by mouth once a day 10)  Zocor 40 Mg Tabs (Simvastatin) .... Take one tablet daily to lower cholesterol. 11)  Glipizide Xl 10 Mg Xr24h-tab (Glipizide) .... Take one tablet daily to lower blood sugars.  Other Orders: T-CBC w/Diff ([id number]) T-Basic Metabolic Panel [id number]) CXR- 2view (CXR)  Patient Instructions: 1)  Please schedule a follow-up appointment in 2 weeks. 2)  If your breathing gets worse, please call the clinic or go to Emergency Department.  3)  MD will call you with your lab results and instructions about antibiotics.    Process Orders Check Orders Results:     [facility]: ABN not required for this insurance Tests Sent for requisitioning (December 17, 2009 10:27 AM):     12/17/2009: [facility] -- T-CBC w/Diff [[id number]] (signed)     12/17/2009: [facility] -- T-Basic Metabolic Panel [phone] (signed)    Prevention & Chronic Care Immunizations   Influenza vaccine: refused  (07/15/2009)   Influenza vaccine deferral: Refused  (04/22/2009)    Tetanus booster: Not documented   Td booster deferral: Refused  (04/22/2009)    Pneumococcal vaccine: Not documented    H. zoster vaccine: Not documented   H. zoster vaccine deferral: Refused  (04/22/2009)  Colorectal Screening   Hemoccult: Not documented   Hemoccult action/deferral: Refused  (04/22/2009)    Colonoscopy: Not documented   Colonoscopy action/deferral: Refused  (04/22/2009)  Other Screening   Pap smear: Not documented   Pap smear action/deferral: Refused  (04/22/2009)    Mammogram: Not documented   Mammogram action/deferral: Refused  (04/22/2009)    DXA bone density scan: Not documented   Smoking status: quit  (12/17/2009)  Diabetes Mellitus   HgbA1C: 8.5  (11/22/2009)   HgbA1C action/deferral: Deferred  (04/22/2009)    Eye exam: Glaucoma.     (05/10/2009)   Diabetic eye exam action/deferral: Refused  (04/22/2009)   Eye exam due: 06/2009    Foot exam: yes  (09/23/2009)   Foot exam action/deferral: Do today   High risk foot: No  (07/15/2009)   Foot care education: Done  (07/15/2009)    Urine microalbumin/creatinine ratio: 44.7  (05/11/2008)   Urine microalbumin action/deferral: Ordered  Lipids   Total Cholesterol: 247  (10/08/2009)   LDL: 156  (10/08/2009)   LDL Direct: Not documented   HDL: 44  (10/08/2009)   Triglycerides: 234  (10/08/2009)    SGOT (AST): 14  (10/08/2009)   SGPT (ALT): 14  (10/08/2009)   Alkaline phosphatase: 102  (10/08/2009)   Total  bilirubin: 0.4  (10/08/2009)  Hypertension   Last Blood Pressure: 127 / 76  (12/17/2009)   Serum creatinine: 1.60  (10/08/2009)   Serum potassium 4.4  (10/08/2009)  Self-Management Support :   Personal Goals (by the next clinic visit) :     Personal A1C goal: 7  (12/17/2009)     Personal blood pressure goal: 130/80  (12/17/2009)     Personal LDL goal: 70  (12/17/2009)    Patient will work on the following items until the next clinic visit to reach self-care goals:     Medications and monitoring: take my medicines every day, check my blood sugar, bring all of my medications to every visit, examine my feet every day  (12/17/2009)     Eating: drink diet soda or water instead of juice or soda, eat more vegetables, use fresh or frozen vegetables, eat foods that are low in salt, eat baked foods instead of fried foods, eat fruit for snacks and desserts, limit or avoid alcohol  (12/17/2009)     Activity: take a 30 minute walk every day, take the stairs instead of the elevator  (09/23/2009)    Diabetes self-management support: Resources for patients handout  (12/17/2009)    Hypertension self-management support: Resources for patients handout  (12/17/2009)    Lipid self-management support: Resources for patients handout  (12/17/2009)        Resource handout printed.  Appended Document: productive cough/gg CBC shows chronic elevation of leucocytes. S creatinine is stable. I will give her avelox for 5 days. I will add in tramadol for pain control.   Appended Document: productive cough/gg left messege regarding meds.

## 2010-10-18 NOTE — Progress Notes (Signed)
Summary: Soc. 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 Work Call placed to: [facility] Summary of Call: Spoke with pt. and family.  They are receptive to home health and do not have a preference of agency.  Advised them of [facility] and ownership connection to [facility] and they were fine with using. Made referral to [facility] for home safety eval,  PT, OT, SLP and medication mgmt.   They will go out today or tomorrow.  [facility] will fax paperwork directly to Dr. Clent Ridges.

## 2010-10-18 NOTE — Progress Notes (Signed)
Summary: Commitment  Phone Note Call from Patient   Caller: Daughter Call For: Vanessa Stable MD Summary of Call: Call from pt's daughter says that pt is being unreasonable.  Driving in the street.  Going in to oncoming traffic.  Left daughter.  Using an expired license.  Arguing with the daughter.  Feels that pt is loosing her mind not taking her medication.  During conversation Police arrived.  Daughter said that she will call abck. Initial call taken by: Angelina Ok RN,  September 27, 2009 1:36 PM  Follow-up for Phone Call        Can we please follow up with pt or daughter and see what has resulted since? Follow-up by: Vanessa Stable MD,  September 28, 2009 8:46 AM  Additional Follow-up for Phone Call Additional follow up Details #1::        see office visit note.  Daughter has left town.  Patient seems competant, though needs assistance with organization. Have written for home health.  She is not driving any more.

## 2010-10-18 NOTE — Miscellaneous (Signed)
Summary: Hospital Discharge Update  Hospital Discharge  Date of admission:11/22/2009  Date of discharge:11/23/2009  Brief reason for admission/active problems: 1) Diarrhea etiology unknown. It has resolved within 24 hours and patient was discharged home. Stool cultures, c diff was not done, as patient was unable to provide a stool sample.   Followup needed: 1) Reassess self-neglect, does patient have HH nurse coming to her house? 2) BMet - check for hypokalemia   The medication and problem lists have been updated.  Please see the dictated discharge summary for details.       Complete Medication List: 1)  Plavix 75 Mg Tabs (Clopidogrel bisulfate) .... Take one tablet daily to prevent stroke. 2)  1st Choice Pen Needles 31g X 8 Mm Misc (Insulin pen needle) .... Use each day with pen 3)  Hydrochlorothiazide 25 Mg Tabs (Hydrochlorothiazide) .... Take 1 tablet by mouth once a day 4)  Lisinopril 40 Mg Tabs (Lisinopril) .... Take 1 tablet by mouth once a day 5)  Relion Insulin Syringe 30g X 5/16" 1 Ml Misc (Insulin syringe-needle u-100) .... Use to inject two times a day. 6)  Metformin Hcl 1000 Mg Tabs (Metformin hcl) .... Take 1 tablet by mouth two times a day 7)  Bayer Contour Monitor W/device Kit (Blood glucose monitoring suppl) .... Use to check sugar two times a day before meals 8)  Wellbutrin Xl 150 Mg Xr24h-tab (Bupropion hcl) .... Take one tablet by mouth in the morning then increase to one tablet by mouth twice daily after one week. 9)  Norvasc 5 Mg Tabs (Amlodipine besylate) .... Take 1 tablet by mouth once a day 10)  Zocor 40 Mg Tabs (Simvastatin) .... Take one tablet daily to lower cholesterol. 11)  Glipizide Xl 10 Mg Xr24h-tab (Glipizide) .... Take one tablet daily to lower blood sugars.   Patient Instructions: 1)  1) Please follow up with Dr. Baltazar Apo of the [facility] on November 29, 2009 at 3 m.  2)  2) Please take all medications as prescribed.

## 2010-10-18 NOTE — Miscellaneous (Signed)
Summary: [facility]: Diabetes Testing Supplies  [facility]: Diabetes Testing Supplies   Imported By: Florinda Marker 11/16/2009 10:11:38  _____________________________________________________________________  External Attachment:    Type:   Image     Comment:   External Document

## 2010-10-18 NOTE — Assessment & Plan Note (Signed)
Summary: f/u, wants to go to work/pcp-walsh/hla   Vital Signs:  Patient profile:   65 year old female Height:      66 inches Weight:      199.3 pounds BMI:     32.28 Pulse rate:   89 / minute BP sitting:   125 / 79  (right arm)  Vitals Entered By: Filomena Jungling NT II (Jan 17, 2010 4:03 PM) Pain Assessment Patient in pain? no      Nutritional Status BMI of > 30 = obese  Have you ever been in a relationship where you felt threatened, hurt or afraid?No   Does patient need assistance? Functional Status Self care Ambulation Normal   Primary Care Provider:  Mariea Stable MD   History of Present Illness: 65yof with pmh of CVA, DM, HTN, HLD here for a check up.  She has no complaints.  She has finished formal PT and OT.  She is moving well, no falls.  She is speaking well and getting better at annunciation.  No chest pain, shortness of breath or neurologic symptoms.  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 Take One Tablet Daily To Prevent Stroke. 2)  1st Choice Pen Needles 31g X 8 Mm Misc (Insulin Pen Needle) .... Use Each Day With Pen 3)  Hydrochlorothiazide 25 Mg Tabs (Hydrochlorothiazide) .... Take 1 Tablet By Mouth Once A Day 4)  Lisinopril 40 Mg Tabs (Lisinopril) .... Take 1 Tablet By Mouth Once A Day 5)  Metformin Hcl 1000 Mg Tabs (Metformin Hcl) .... Take 1 Tablet By Mouth Two Times A Day 6)  Bayer Contour Monitor W/device Kit (Blood Glucose Monitoring Suppl) .... Use To Check Sugar Two Times A Day Before Meals 7)  Wellbutrin Xl 150 Mg  Xr24h-Tab (Bupropion Hcl) .... Take One Tablet By Mouth in The Morning Then Increase To One Tablet By Mouth Twice Daily After One Week. 8)  Zocor 80 Mg Tabs (Simvastatin) .... Take One Tablet Daily To Decrease Cholesterol. 9)  Glipizide Xl 10 Mg Xr24h-Tab (Glipizide) .... Take One Tablet Daily To Lower Blood Sugars.  Allergies (verified): 1)  ! Pcn 2)  ! Codeine  Review of Systems       per hpi  Physical Exam  General:  alert and well-developed.   Lungs:  normal respiratory effort and normal breath sounds.   Heart:  normal rate, regular rhythm, and no murmur.   Pulses:  2+ Extremities:  no edema Neurologic:  alert & oriented X3.  Gait is almost normal with slight limp.  There is no facial droop.  Stilll some slurring of speech.  Some residual unilateral weakness.  No ataxia. Psych:  Oriented X3, memory intact for recent and remote, normally interactive, good eye contact, and not anxious appearing.     Impression & Recommendations:  Problem # 1:  CVA (ICD-434.91)  Wants to go back to work.  Needs me to call the [employer], Clois Dupes has the number, this is the group that handles  her insurance.  Needs me to say that she can come back to work.  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  I believe she will be able to do this.  She does not seem to have any cognitive impairment.  She will have difficulty typing at least for a little while because her hand is still clumsy and slow.  I will recomend that she have a little more time to complete tasks.  No restrictions.  She seems very motivated to get back to work and I think it would be very healthy for her to re-engage professionally.  No driving.  She promises that she has a friend who is willing to take her to and from work.  I have asked Dr. Sherlean Foot about PT for driving.  There is a designated course, but it is not covered by insurance and is often expensive.  He recomended PT to focus on  "reflexes and reaction time" instead.  I will refer her for this.     Her updated medication list for this problem includes:    Plavix 75 Mg Tabs (Clopidogrel bisulfate) .[name]... Take one tablet daily to prevent stroke.  Orders: Occupational Therapy (OT)  Problem # 2:  HYPERTENSION (ICD-401.9) BP is controled.  No changes.  Her updated medication list for this problem includes:    Hydrochlorothiazide 25 Mg Tabs (Hydrochlorothiazide) .[name]... Take 1 tablet by mouth once a day    Lisinopril 40 Mg Tabs (Lisinopril) .[name]... Take 1 tablet by mouth once a day  BP today: 125/79 Prior BP: 121/75 (12/28/2009)  Labs Reviewed: K+: 4.2 (12/17/2009) Creat: : 0.96 (12/17/2009)   Chol: 199 (12/28/2009)   HDL: 36 (12/28/2009)   LDL: 123 (12/28/2009)   TG: 202 (12/28/2009)  Problem # 3:  DIABETES MELLITUS, TYPE II (ICD-250.00) A1C has been higher than goal, and glipizide was increased.  Next A1c is due in June.  She says that her home cbg's are in the 100-150 range.  Her updated medication list for this problem includes:    Lisinopril 40 Mg Tabs (Lisinopril) .[name]... Take 1 tablet by mouth once a day    Metformin Hcl 1000 Mg Tabs (Metformin hcl) .[name]... Take 1 tablet by mouth two times a day    Glipizide Xl 10 Mg Xr24h-tab (Glipizide) .[name]... Take one tablet daily to lower blood sugars.  Labs Reviewed: Creat: 0.96 (12/17/2009)     Last Eye Exam: Glaucoma.    (05/10/2009) Reviewed HgBA1c results: 8.5 (11/22/2009)  8.2 (10/07/2009)  Problem # 4:  RENAL INSUFFICIENCY, ACUTE (ICD-585.9) seems resolved.  Labs Reviewed: BUN: 16 (12/17/2009)   Cr: 0.96 (12/17/2009)    Hgb: 11.4 (12/17/2009)   Hct: 34.0 (12/17/2009)   Ca++: 9.7 (12/17/2009)   Phos: 3.1 (05/17/2009) TP: 7.5 (10/08/2009)   Alb: 4.5 (10/08/2009)  Problem # 5:  HYPERLIPIDEMIA (ICD-272.4) LDL not at goal at last check.  Zocor dose increased at last visit.  Recheck lipids with A1C in June.  Her updated medication list for this problem  includes:    Zocor 80 Mg Tabs (Simvastatin) .[name]... Take one tablet daily to decrease cholesterol.  Labs Reviewed: SGOT: 14 (10/08/2009)   SGPT: 14 (10/08/2009)   HDL:36 (12/28/2009), 44 (10/08/2009)  LDL:123 (12/28/2009), 156 (10/08/2009)  Chol:199 (12/28/2009), 247 (10/08/2009)  Trig:202 (12/28/2009), 234 (10/08/2009)  Complete Medication List: 1)  Plavix 75 Mg Tabs (Clopidogrel bisulfate) .... Take one tablet daily to prevent stroke. 2)  1st Choice Pen Needles 31g X 8 Mm  Misc (Insulin pen needle) .... Use each day with pen 3)  Hydrochlorothiazide 25 Mg Tabs (Hydrochlorothiazide) .... Take 1 tablet by mouth once a day 4)  Lisinopril 40 Mg Tabs (Lisinopril) .... Take 1 tablet by mouth once a day 5)  Metformin Hcl 1000 Mg Tabs (Metformin hcl) .... Take 1 tablet by mouth two times a day 6)  Bayer Contour Monitor W/device Kit (Blood glucose monitoring suppl) .... Use to check sugar two times a day before meals 7)  Wellbutrin Xl 150 Mg Xr24h-tab (Bupropion hcl) .... Take one tablet by mouth in the morning then increase to one tablet by mouth twice daily after one week. 8)  Zocor 80 Mg Tabs (Simvastatin) .... Take one tablet daily to decrease cholesterol. 9)  Glipizide Xl 10 Mg Xr24h-tab (Glipizide) .... Take one tablet daily to lower blood sugars. 10)  Ibuprofen 800 Mg Tabs (Ibuprofen) .... Take one tablet two times a day as needed for pain.  Patient Instructions: 1)  I will contact the [facility] about going back to work. 2)  We will call you about physical therapy for reflexes and reaction time. Prescriptions: IBUPROFEN 800 MG TABS (IBUPROFEN) Take one tablet two times a day as needed for pain.  #20 x 0   Entered and Authorized by:   Elby Showers MD   Signed by:   Elby Showers MD on 01/18/2010   Method used:   Electronically to        [facility] [address] [id number]* (retail)       [address] [place], [place]  [address]       Ph: [phone]       Fax: [phone]   RxID:    [id number]

## 2010-10-18 NOTE — Miscellaneous (Signed)
Summary: Recent Hospital Admission  Pt was hospitalized on 09/15/09 for CVA.  Left AMA on 09/17/09 despite long discussion about risks of leaving and need for continued inpatient care; pt expressed understanding and awareness of leaving hospital against medical advise.   Pt and daughter called clinic on 09/20/08 with questions about continued treatment and discharge instructions.  Informed daughter pt left hospital AMA; she was unaware.  Will schedule pt appt for f/u in clinic.  She may be leaving [place] to live with daughter in [place], [place].  She will need continued management of her CVA and lifelong Plavix (failed ASA therapy).  Additionally, she would benefit from HHPT in [place] or [place].  If she is going to remain in [place] for any length of time, she will need referral for HHPT at clinic appt.    I will refill her home meds and send in rx for Plavix.    Clinical Lists Changes  Medications: Rx of HYDROCHLOROTHIAZIDE 25 MG TABS (HYDROCHLOROTHIAZIDE) Take 1 tablet by mouth once a day;  #30 x 3;  Signed;  Entered by: Nelda Bucks DO;  Authorized by: Nelda Bucks DO;  Method used: Electronically to [facility]. [phone]*, [address] [place], [place], [place]  [address], Ph: [phone], Fax: [phone] Rx of LISINOPRIL 40 MG TABS (LISINOPRIL) Take 1 tablet by mouth once a day;  #30 x 3;  Signed;  Entered by: Nelda Bucks DO;  Authorized by: Nelda Bucks DO;  Method used: Electronically to [facility]. [phone]*, [address], [place], [place], [place]  [address], Ph: [phone], Fax: [phone] Rx of METFORMIN HCL 1000 MG TABS (METFORMIN HCL) Take 1 tablet by mouth two times a day;  #60 x 3;  Signed;  Entered by: Nelda Bucks DO;  Authorized by: Nelda Bucks DO;  Method used: Electronically to [facility] [phone]*, [address] [place], [place], [place]  [address], Ph: [phone], Fax: [phone] Rx of WELLBUTRIN XL 150 MG XR24H-TAB (BUPROPION  HCL) Take one tablet by mouth in the morning then increase to one tablet by mouth twice daily after one week.;  #60 x 3;  Signed;  Entered by: Nelda Bucks DO;  Authorized by: Nelda Bucks DO;  Method used: Electronically to [facility] [phone]*, [address] [place], [place], [place]  [address], Ph: [phone], Fax: [phone] Rx of NORVASC 5 MG TABS (AMLODIPINE BESYLATE) Take 1 tablet by mouth once a day;  #30 x 3;  Signed;  Entered by: Nelda Bucks DO;  Authorized by: Nelda Bucks DO;  Method used: Electronically to [facility] [phone]*, [address] [place], [place], [place]  [address], Ph: [phone], Fax: [phone]    Prescriptions: NORVASC 5 MG TABS (AMLODIPINE BESYLATE) Take 1 tablet by mouth once a day  #30 x 3   Entered and Authorized by:   Nelda Bucks DO   Signed by:   Nelda Bucks DO on 09/20/2009   Method used:   Electronically to        [facility] [phone]* (retail)       [address]       [place], [place]  [address]       Ph: [phone]       Fax: [phone]   RxID:   [id number] WELLBUTRIN XL 150 MG XR24H-TAB (BUPROPION HCL) Take one tablet by mouth in the morning then increase to one  tablet by mouth twice daily after one week.  #60 x 3   Entered and Authorized by:   Nelda Bucks DO   Signed by:   Nelda Bucks DO on 09/20/2009   Method used:   Electronically to        [facility] [phone]* (retail)       [address]       [place], [place]  [address]       Ph: [phone]       Fax: [phone]   RxID:   [id number] METFORMIN HCL 1000 MG TABS (METFORMIN HCL) Take 1 tablet by mouth two times a day  #60 x 3   Entered and Authorized by:   Nelda Bucks DO   Signed by:   Nelda Bucks DO on 09/20/2009   Method used:   Electronically to        [facility] [phone]* (retail)       [address]       [place], [place]  [address]       Ph: [phone]       Fax: [phone]   RxID:   [id number] LISINOPRIL 40 MG TABS (LISINOPRIL) Take 1 tablet by mouth once a day  #30 x 3   Entered and Authorized by:   Nelda Bucks DO   Signed by:   Nelda Bucks DO on 09/20/2009   Method used:   Electronically to        [facility] [phone]* (retail)       [address]       [place], [place]  [address]       Ph: [phone]       Fax: [phone]   RxID:   [id number] HYDROCHLOROTHIAZIDE 25 MG TABS (HYDROCHLOROTHIAZIDE) Take 1 tablet by mouth once a day  #30 x 3   Entered and Authorized by:   Nelda Bucks DO   Signed by:   Nelda Bucks DO on 09/20/2009   Method used:   Electronically to        [facility] [phone]* (retail)       [address]       [place], [place]  [address]       Ph: [phone]       Fax: [phone]   RxID:   [id number]

## 2010-10-18 NOTE — Miscellaneous (Signed)
[facility]   Imported By: Margie Billet 01/05/2010 12:20:23  _____________________________________________________________________  External Attachment:    Type:   Image     Comment:   External Document

## 2010-10-18 NOTE — Progress Notes (Signed)
Summary: Social Work  Nurse, children's placed by: Soc. Work Call placed to: [facility] Summary of Call: [facility] to make referral for [facility] to help patient pick up groceries and medicine.   Maxine Glenn is sending me agreement form.

## 2010-10-18 NOTE — Miscellaneous (Signed)
Summary: [facility]   Imported By: Margie Billet 07/01/2010 10:56:29  _____________________________________________________________________  External Attachment:    Type:   Image     Comment:   External Document

## 2010-10-18 NOTE — Progress Notes (Signed)
Summary: phone/gg  Phone Note From Other Clinic   Summary of Call: Received a call from Vinnie Langton, RN with [facility].  He has been seeing Vanessa Calderon and has one visit left BUT pt has told him she is moving out of [place] and will be gone next week.  He just wanted to make [place] aware of this. Initial call taken by: Merrie Roof RN,  October 27, 2009 10:14 AM  Follow-up for Phone Call        She has been planning to move to her daughter's house in [place].  I did not realize that she was planning to do this so soon.

## 2010-10-18 NOTE — Progress Notes (Signed)
  Phone Note Other Incoming   Request: Send information Details for Reason: Request Form Summary of Call: Rec'd Forms from [facility] requesting you to complete so that this patient can go back to work.  I have placed these forms in your box and Doreene Adas is the contact person.  She is requeting that we sen in her office notes along with your permission to return back to work.  She is also requesting that you have this completed as soon as possible so that the patient can go back to work.  The phone number where she can be reached is [phone] if you have any questions regarding this request. Initial call taken by: Shon Hough,  Jan 19, 2010 12:09 PM  Follow-up for Phone Call        forms received and completed.     Appended Document:  Pt called and stated her paper work was not received by [facility].  She is coming to get the form and deliver it.

## 2010-10-18 NOTE — Progress Notes (Signed)
Summary: med refill/gp  Phone Note Refill Request Message from:  Fax from Pharmacy on January 12, 2010 4:08 PM  Refills Requested: Medication #1:  HYDROCHLOROTHIAZIDE 25 MG TABS Take 1 tablet by mouth once a day  Medication #2:  METFORMIN HCL 1000 MG TABS Take 1 tablet by mouth two times a day Request 90- day supply   Method Requested: Electronic Initial call taken by: Chinita Pester RN,  January 12, 2010 4:08 PM    Prescriptions: METFORMIN HCL 1000 MG TABS (METFORMIN HCL) Take 1 tablet by mouth two times a day  #60 x 6   Entered and Authorized by:   Elby Showers MD   Signed by:   Elby Showers MD on 01/14/2010   Method used:   Electronically to        [facility]* (mail-order)             ,          Ph: [phone]       Fax: [phone]   RxID:   [id number] HYDROCHLOROTHIAZIDE 25 MG TABS (HYDROCHLOROTHIAZIDE) Take 1 tablet by mouth once a day  #30 x 6   Entered and Authorized by:   Elby Showers MD   Signed by:   Elby Showers MD on 01/14/2010   Method used:   Electronically to        [facility]* (mail-order)             ,          Ph: [phone]       Fax: [phone]   RxID:   [id number]

## 2010-10-18 NOTE — Assessment & Plan Note (Signed)
Summary: EST-CK/FU/MEDS/CFB   Vital Signs:  Patient profile:   65 year old female Height:      66 inches (167.64 cm) Weight:      194.0 pounds (88.18 kg) BMI:     31.43 Temp:     98.4 degrees F oral Pulse rate:   98 / minute BP sitting:   121 / 75  (right arm)  Vitals Entered By: Chinita Pester RN (December 28, 2009 2:56 PM) CC: F/U visit. Is Patient Diabetic? Yes Did you bring your meter with you today? No Pain Assessment Patient in pain? no      Nutritional Status BMI of > 30 = obese CBG Result 146  Have you ever been in a relationship where you felt threatened, hurt or afraid?No   Does patient need assistance? Functional Status Self care Ambulation Normal   Primary Care Provider:  Mariea Stable MD  CC:  F/U visit.[name]  History of Present Illness: 65 yo with pmh outlined in this chart here for follow up visit.  She had a stoke earlier this year and we have been working on secondary risk reduction as well as stroke recovery.  She is speaking more clearly and getting around well.  She feels great, no cough, no nvd.  Her major concern today is that she wants to drive.    Depression History:      The patient denies a depressed mood most of the day and a diminished interest in her usual daily activities.         Preventive Screening-Counseling & Management  Alcohol-Tobacco     Alcohol drinks/day: 0     Smoking Status: quit     Smoking Cessation Counseling: no     Packs/Day: <0.25     Year Quit: 1996  Caffeine-Diet-Exercise     Does Patient Exercise: yes     Type of exercise: walking     Times/week: <3  Current Medications (verified): 1)  Plavix 75 Mg Tabs (Clopidogrel Bisulfate) .... Take One Tablet Daily To Prevent Stroke. 2)  1st Choice Pen Needles 31g X 8 Mm Misc (Insulin Pen Needle) .... Use Each Day With Pen 3)  Hydrochlorothiazide 25 Mg Tabs (Hydrochlorothiazide) .... Take 1 Tablet By Mouth Once A Day 4)  Lisinopril 40 Mg Tabs (Lisinopril) .... Take 1 Tablet  By Mouth Once A Day 5)  Relion Insulin Syringe 30g X 5/16" 1 Ml Misc (Insulin Syringe-Needle U-100) .... Use To Inject Two Times A Day. 6)  Metformin Hcl 1000 Mg Tabs (Metformin Hcl) .... Take 1 Tablet By Mouth Two Times A Day 7)  Bayer Contour Monitor W/device Kit (Blood Glucose Monitoring Suppl) .... Use To Check Sugar Two Times A Day Before Meals 8)  Wellbutrin Xl 150 Mg Xr24h-Tab (Bupropion Hcl) .... Take One Tablet By Mouth in The Morning Then Increase To One Tablet By Mouth Twice Daily After One Week. 9)  Zocor 40 Mg Tabs (Simvastatin) .... Take One Tablet Daily To Lower Cholesterol. 10)  Glipizide Xl 10 Mg Xr24h-Tab (Glipizide) .... Take One Tablet Daily To Lower Blood Sugars.  Allergies (verified): 1)  ! Pcn 2)  ! Codeine  Social History: Does Patient Exercise:  yes  Review of Systems       per hpi  Physical Exam  General:  alert and well-developed.   Head:  normocephalic and atraumatic.   Eyes:  vision grossly intact, pupils equal, pupils round, and pupils reactive to light.   Mouth:  good dentition and pharynx pink and  moist.   Lungs:  normal respiratory effort and normal breath sounds.   Heart:  normal rate, regular rhythm, and no murmur.   Msk:  strength is 4/5 in right arm and leg. no joint problems. Pulses:  2+ Extremities:  no edema Neurologic:  alert & oriented X3 and cranial nerves II-XII intact.  gait is good, good balance and no trouble with pivoting.  slight limp/shuffle as right leg is weaker Psych:  Oriented X3, memory intact for recent and remote, normally interactive, and good eye contact.     Impression & Recommendations:  Problem # 1:  CVA (ICD-434.91) Working towards secondary risk reduction. She seems to be recoverng well, speech much more clear, gait is stable, she is more confident.  She really wants to drive again.  She is only 3 months out from her stroke.  I will call her PMNR and see if they do occupational rehab for driving.  I do not feel  comfortable evaluating her for driving as I can not assess her reaction time etc.  Her updated medication list for this problem includes:    Plavix 75 Mg Tabs (Clopidogrel bisulfate) .[name]... Take one tablet daily to prevent stroke.  Problem # 2:  HYPERTENSION (ICD-401.9) BP is great. no changes.  says that she is not taking norvasc so I will take it off her list.  The following medications were removed from the medication list:    Norvasc 5 Mg Tabs (Amlodipine besylate) .[name]... Take 1 tablet by mouth once a day Her updated medication list for this problem includes:    Hydrochlorothiazide 25 Mg Tabs (Hydrochlorothiazide) .[name]... Take 1 tablet by mouth once a day    Lisinopril 40 Mg Tabs (Lisinopril) .[name]... Take 1 tablet by mouth once a day  BP today: 121/75 Prior BP: 127/76 (12/17/2009)  Labs Reviewed: K+: 4.2 (12/17/2009) Creat: : 0.96 (12/17/2009)   Chol: 247 (10/08/2009)   HDL: 44 (10/08/2009)   LDL: 156 (10/08/2009)   TG: 234 (10/08/2009)  Problem # 3:  CIGARETTE SMOKER (ICD-305.1) denies smoking, but smells of smoke.  states that people are smoking around her.  Encouraged her to avoid smoke and smoking as it increases her risk of stroke.  Problem # 4:  DIABETES MELLITUS, TYPE II (ICD-250.00) A1C has remained above goal.  Glipizide was recently added.  Will need another a1c in June.    Her updated medication list for this problem includes:    Lisinopril 40 Mg Tabs (Lisinopril) .[name]... Take 1 tablet by mouth once a day    Metformin Hcl 1000 Mg Tabs (Metformin hcl) .[name]... Take 1 tablet by mouth two times a day    Glipizide Xl 10 Mg Xr24h-tab (Glipizide) .[name]... Take one tablet daily to lower blood sugars.  Orders: Capillary Blood Glucose/CBG (16109) T-Urine Microalbumin w/creat. ratio [phone])  Labs Reviewed: Creat: 0.96 (12/17/2009)     Last Eye Exam: Glaucoma.    (05/10/2009) Reviewed HgBA1c results: 8.5 (11/22/2009)  8.2 (10/07/2009)  Problem # 5:  HYPERLIPIDEMIA  (ICD-272.4) Above goal at last check and was put on zocor.  Will recheck today for efficacy and to guide therapy.  Her updated medication list for this problem includes:    Zocor 40 Mg Tabs (Simvastatin) .[name]... Take one tablet daily to lower cholesterol.  Orders: T-Lipid Profile [id number])  Labs Reviewed: SGOT: 14 (10/08/2009)   SGPT: 14 (10/08/2009)   HDL:44 (10/08/2009), 36 (05/03/2009)  LDL:156 (10/08/2009), 87 (65/78/4696)  Chol:247 (10/08/2009), 192 (05/03/2009)  Trig:234 (10/08/2009), 345 (05/03/2009)  Complete  Medication List: 1)  Plavix 75 Mg Tabs (Clopidogrel bisulfate) .... Take one tablet daily to prevent stroke. 2)  1st Choice Pen Needles 31g X 8 Mm Misc (Insulin pen needle) .... Use each day with pen 3)  Hydrochlorothiazide 25 Mg Tabs (Hydrochlorothiazide) .... Take 1 tablet by mouth once a day 4)  Lisinopril 40 Mg Tabs (Lisinopril) .... Take 1 tablet by mouth once a day 5)  Metformin Hcl 1000 Mg Tabs (Metformin hcl) .... Take 1 tablet by mouth two times a day 6)  Bayer Contour Monitor W/device Kit (Blood glucose monitoring suppl) .... Use to check sugar two times a day before meals 7)  Wellbutrin Xl 150 Mg Xr24h-tab (Bupropion hcl) .... Take one tablet by mouth in the morning then increase to one tablet by mouth twice daily after one week. 8)  Zocor 40 Mg Tabs (Simvastatin) .... Take one tablet daily to lower cholesterol. 9)  Glipizide Xl 10 Mg Xr24h-tab (Glipizide) .... Take one tablet daily to lower blood sugars.  Patient Instructions: 1)  Please schedule a follow-up appointment in 2 months. 2)  You had lab work done today, we will call you if there is anything that needs to be addressed before your next appointment.  Prevention & Chronic Care Immunizations   Influenza vaccine: refused  (07/15/2009)   Influenza vaccine deferral: Refused  (04/22/2009)    Tetanus booster: Not documented   Td booster deferral: Refused  (04/22/2009)    Pneumococcal vaccine: Not  documented    H. zoster vaccine: Not documented   H. zoster vaccine deferral: Refused  (04/22/2009)  Colorectal Screening   Hemoccult: Not documented   Hemoccult action/deferral: Refused  (04/22/2009)    Colonoscopy: Not documented   Colonoscopy action/deferral: Refused  (04/22/2009)  Other Screening   Pap smear: Not documented   Pap smear action/deferral: Refused  (04/22/2009)    Mammogram: Not documented   Mammogram action/deferral: Refused  (04/22/2009)    DXA bone density scan: Not documented   Smoking status: quit  (12/28/2009)  Diabetes Mellitus   HgbA1C: 8.5  (11/22/2009)   HgbA1C action/deferral: Deferred  (04/22/2009)    Eye exam: Glaucoma.     (05/10/2009)   Diabetic eye exam action/deferral: Refused  (04/22/2009)   Eye exam due: 06/2009    Foot exam: yes  (09/23/2009)   Foot exam action/deferral: Do today   High risk foot: No  (07/15/2009)   Foot care education: Done  (07/15/2009)    Urine microalbumin/creatinine ratio: 44.7  (05/11/2008)   Urine microalbumin action/deferral: Ordered    Diabetes flowsheet reviewed?: Yes   Progress toward A1C goal: Unchanged  Lipids   Total Cholesterol: 247  (10/08/2009)   Lipid panel action/deferral: Lipid Panel ordered   LDL: 156  (10/08/2009)   LDL Direct: Not documented   HDL: 44  (10/08/2009)   Triglycerides: 234  (10/08/2009)    SGOT (AST): 14  (10/08/2009)   SGPT (ALT): 14  (10/08/2009)   Alkaline phosphatase: 102  (10/08/2009)   Total bilirubin: 0.4  (10/08/2009)    Lipid flowsheet reviewed?: Yes   Progress toward LDL goal: Unchanged  Hypertension   Last Blood Pressure: 121 / 75  (12/28/2009)   Serum creatinine: 0.96  (12/17/2009)   Serum potassium 4.2  (12/17/2009)    Hypertension flowsheet reviewed?: Yes   Progress toward BP goal: At goal  Self-Management Support :   Personal Goals (by the next clinic visit) :     Personal A1C goal: 7  (12/17/2009)  Personal blood pressure goal: 130/80   (12/17/2009)     Personal LDL goal: 70  (12/17/2009)    Patient will work on the following items until the next clinic visit to reach self-care goals:     Medications and monitoring: take my medicines every day, bring all of my medications to every visit  (12/28/2009)     Eating: drink diet soda or water instead of juice or soda, eat more vegetables, use fresh or frozen vegetables, eat foods that are low in salt, eat baked foods instead of fried foods  (12/28/2009)     Activity: take a 30 minute walk every day  (12/28/2009)    Diabetes self-management support: Written self-care plan  (12/28/2009)   Diabetes care plan printed    Hypertension self-management support: Written self-care plan  (12/28/2009)   Hypertension self-care plan printed.    Lipid self-management support: Written self-care plan  (12/28/2009)   Lipid self-care plan printed.  Process Orders Check Orders Results:     [facility]: ABN not required for this insurance Tests Sent for requisitioning (December 30, 2009 8:26 AM):     12/28/2009: [facility] -- T-Urine Microalbumin w/creat. ratio [[id number]] (signed)     12/28/2009: [facility] -- T-Lipid Profile [id number] (signed)

## 2010-10-18 NOTE — Miscellaneous (Signed)
Summary: [facility]   [facility]   Imported By: Margie Billet 12/29/2009 14:10:28  _____________________________________________________________________  External Attachment:    Type:   Image     Comment:   External Document

## 2010-10-18 NOTE — Miscellaneous (Signed)
Summary: Hospital Admission: 11/22/2009  INTERNAL MEDICINE ADMISSION HISTORY AND PHYSICAL  PCP:Dr. Clent Ridges   CC: Diarrhea  HPI: 38 yof with pmh significant for recent CVA, HTN, DM, HLD, Depression presented to the clinic for symptoms of N/V.  When patient arrived to the floor history was obtained again and pt reported only one episode of nonbloody diarrhea while she was in the clinic. She is not sure why it happend, she can only remember being somewhat groggy and "off balance" this morning. She does not recall any recent similar events and reports no specific precipitating factors. She denies fever, chills, chest pain, cough, no N/V, no abdominal pain, no blood in stool. She also denies urinary concerns. No changes in appetite, no recent weight loss or gain. No recent travel, no sick contacts or exposures.   She has had a recent CVA, and she is recieving home health PT/OT/ST.  Per clinic report she has vomited three times in the waiting room, she smelled of feces and has had diarrhea in her clothes.   ALLERGIES:  ! PCN ! 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   Former Smoker Alcohol use-no Regular exercise-no She has health insurance   FAMILY HISTORY: Mother- Htn, dementia Father- DM Daughter- congenital heart disease   ROS: As per HPI  VITALS: Height:      66 inches Weight:      194.8 pounds BMI:     31.56 Temp:     97.8 degrees F oral Pulse rate:   81 / minute BP sitting:   155 / 83  (right  arm)  PHYSICAL EXAM:  General:  alert and well-developed.   Head:  normocephalic and atraumatic.   Eyes:  vision grossly intact, pupils equal, pupils round, and pupils reactive to light.   Mouth:  pharynx pink and moist.   Lungs:  normal respiratory effort and normal breath sounds.   Heart:  normal rate, regular rhythm, and no murmur.   Msk:  still with decreased strength on the left Pulses:  2+ Extremities:  no edema Neurologic:  alert & oriented X3.  still with slight facial droop and dysarthria, some weakness and gait instability.  no cognitive defect. Psych:  Oriented X3, memory intact for recent and remote, normally interactive, and good eye contact.   LABS:  ! LIPASE                    23 U/L                      11-59  ! SODIUM                    138 mEq/L                   135-145 ! POTASSIUM            [L]  3.3 mEq/L  3.5-5.1 ! CHLORIDE                  103 mEq/L                   96-112 ! CARBON DIOXIDE            27 mEq/L                    19-32 ! GLUCOSE              [H]  279 mg/dL                   84-13 ! BUN                       17 mg/dL                    2-44 ! CREATININE                1.02 mg/dL                  0.1-0.2 ! CALCIUM                   9.0 mg/dL                   7.2-53.6 ! TOTAL PROTEIN             7.9 g/dL                    6.4-4.0 ! ALBUMIN                   4.1 g/dL                    3.4-7.4 ! AST/SGOT                  18 U/L                      0-37 ! ALT/SGPT                  18 U/L                      0-35 ! ALKALINE PHOSPHATASE      110 U/L                     39-117 ! BILIRUBIN, TOTAL          0.7 mg/dL                   2.5-9.5 ! GFR, Est Non Af Am   [L]  55 mL/min                   >60 ! GFR, Est Afr Am           >60 mL/min                  >60  ! WBC COUNT            [H]  12.3 K/uL                   4.0-10.5 ! RBC COUNT            [L]  3.53 MIL/uL  3.87-5.11 ! HEMOGLOBIN           [L]  11.7 g/dL                    04.5-40.9 ! HEMATOCRIT           [L]  34.5 %                      36.0-46.0 ! MCV                       98.0 fL                     78.0-100.0 ! MCHC                      33.8 g/dL                   81.1-91.4 ! RDW                       13.1 %                      11.5-15.5 ! PLATELET COUNT            325 K/uL                    150-400 ! NEUTROPHIL           [H]  85 %                        43-77 ! ABS GRANULOCYTE      [H]  10.4 K/uL                   1.7-7.7 ! LYMPHOCYTE                12 %                        12-46 ! ABS LYMPH                 1.4 K/uL                    0.7-4.0 ! MONOCYTE                  3 %                         3-12 ! ABS MONOCYTE              0.4 K/uL                    0.1-1.0 ! EOSINOPHIL                0 %                         0-5 ! ABS EOS                   0.0 K/uL                    0.0-0.7 ! BASOPHIL                  0 %  0-1 ! ABS BASO                  0.0 K/uL                    0.0-0.1    ASSESSMENT AND PLAN:  (1) Diarrhea and Vomiting of sudden onset - differential diagnosis broad including both viral and bacterial etiologies. Concern for [place] virus given recent outbreak. Patient does have slight elevation in white count. Lipase and LFTs are wnl.  Plan: Hydration -Will check UA  -Blood cultures x2 -Stool cultures, O & P -Contact precautions, rule out C. Diff  -Check HIV, UDS, TSH  2) ZO:XWRU A1C 8.2 09/2009, will cover with SSI.  3) HTN: Continue home medications LISINOPRIL  4) VTE PROPH: lovenox  5) Hx of CVA - Continue statin and ASA.

## 2010-10-18 NOTE — Assessment & Plan Note (Signed)
Summary: ACUTE-RIGHT SHOULDER PAIN/([facility])/CFB   Vital Signs:  Patient profile:   65 year old female Height:      66 inches (167.64 cm) Weight:      200.6 pounds (91.18 kg) Temp:     97.4 degrees F (36.33 degrees C) oral Pulse rate:   105 / minute BP sitting:   153 / 85  (left arm) Cuff size:   large  Vitals Entered By: Cynda Familia Duncan Dull) (March 10, 2010 9:07 AM) CC: right shoulder/ arm pain that started about 2wks ago with decreased range of motion, no relief with flexeril and know c/o left knee pain that started this am Is Patient Diabetic? Yes Did you bring your meter with you today? No Pain Assessment Patient in pain? yes     Location: right shoulder Intensity: 9 Type: sharp Onset of pain  Constant for about 2wks Nutritional Status BMI of > 30 = obese  Have you ever been in a relationship where you felt threatened, hurt or afraid?No   Does patient need assistance? Functional Status Self care Ambulation Impaired:Risk for fall Comments uses cane s/p  stroke   Primary Care Provider:  Mariea Stable MD  CC:  right shoulder/ arm pain that started about 2wks ago with decreased range of motion and no relief with flexeril and know c/o left knee pain that started this am.  History of Present Illness: Patient is a 65 yo AAF with of shoulder pain, DM, HTN and back pain came here for f/u her shoulder pai. She said she only took naprosyn, seems not work well for her pain, did not use cyclobenzaprine or heating pad. Her shoulder pain about 9/10, no radiation, fever or injury.  Moving makes it better, neck pain rsolved, no numbness or tingling.  Current smoker, 5 ciga per day, no ETOH or drugs.   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 Take One Tablet Daily To Prevent Stroke. 2)  1st Choice Pen Needles 31g X 8 Mm Misc (Insulin Pen Needle) .... Use Each Day With Pen 3)  Hydrochlorothiazide 25 Mg Tabs (Hydrochlorothiazide) .... Take 1 Tablet By Mouth Once A Day 4)  Lisinopril 40 Mg Tabs (Lisinopril) .... Take 1 Tablet By Mouth Once A Day 5)  Metformin Hcl 1000 Mg Tabs (Metformin Hcl) .... Take 1 Tablet By Mouth Two Times A Day 6)  Bayer Contour Monitor W/device Kit (Blood Glucose Monitoring Suppl) .... Use To Check Sugar Two Times A Day Before Meals 7)  Wellbutrin Xl 150 Mg Xr24h-Tab (Bupropion Hcl) .... Take One Tablet By Mouth in The Morning Then Increase To One Tablet By Mouth Twice Daily After  One Week. 8)  Zocor 80 Mg Tabs (Simvastatin) .... Take One Tablet Daily To Decrease Cholesterol. 9)  Glipizide Xl 10 Mg Xr24h-Tab (Glipizide) .... Take One Tablet Daily To Lower Blood Sugars. 10)  Cyclobenzaprine Hcl 10 Mg Tabs (Cyclobenzaprine Hcl) .... Take One Tablet Three Times A Day Over The Weekend.  Take One Tablet At Bedtime Next Week. 11)  Icy Hot 5 % Pads (Menthol (Topical Analgesic)) .... Use As Directed On Package. 12)  Naprosyn 250 Mg Tabs (Naproxen) .... Take One Tablet Two Times A Day For Neck and Shoulder Pain.  Current Medications (verified): 1)  Plavix 75 Mg Tabs (Clopidogrel Bisulfate)  .... Take One Tablet Daily To Prevent Stroke. 2)  1st Choice Pen Needles 31g X 8 Mm Misc (Insulin Pen Needle) .... Use Each Day With Pen 3)  Hydrochlorothiazide 25 Mg Tabs (Hydrochlorothiazide) .... Take 1 Tablet By Mouth Once A Day 4)  Lisinopril 40 Mg Tabs (Lisinopril) .... Take 1 Tablet By Mouth Once A Day 5)  Metformin Hcl 1000 Mg Tabs (Metformin Hcl) .... Take 1 Tablet By Mouth Two Times A Day 6)  Bayer Contour Monitor W/device Kit (Blood Glucose Monitoring Suppl) .... Use To Check Sugar Two Times A Day Before Meals 7)  Wellbutrin Xl 150 Mg Xr24h-Tab (Bupropion Hcl) .... Take One Tablet By Mouth in The Morning Then Increase To One Tablet By Mouth Twice Daily After One Week. 8)  Zocor 80 Mg Tabs (Simvastatin) .... Take One Tablet Daily To Decrease Cholesterol. 9)  Glipizide Xl 10 Mg Xr24h-Tab (Glipizide) .... Take One Tablet Daily To Lower Blood Sugars. 10)  Cyclobenzaprine Hcl 10 Mg Tabs (Cyclobenzaprine Hcl) .... Take One Tablet Three Times A Day Over The Weekend.  Take One Tablet At Bedtime Next Week. 11)  Icy Hot 5 % Pads (Menthol (Topical Analgesic)) .... Use As Directed On Package. 12)  Naprosyn 250 Mg Tabs (Naproxen) .... Take One Tablet Two Times A Day For Neck and Shoulder Pain.  Allergies (verified): 1)  ! Pcn 2)  ! 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   Former Smoker Alcohol use-no Regular exercise-no She has health insurance  Risk Factors: Alcohol Use: 0 (02/24/2010) Exercise: yes (02/24/2010)  Risk Factors: Smoking Status: quit (02/24/2010) Packs/Day: <0.25 (02/24/2010)  Family History: Reviewed history from 05/11/2008 and no changes required. Mother- Htn, dementia Father- DM Daughter- congenital heart disease  Social History: Reviewed history from 07/15/2009 and no changes required. 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   Former Smoker Alcohol use-no Regular exercise-no She has Programmer, applications  Review of Systems  The patient denies fever, chest pain, dyspnea on exertion, peripheral edema, headaches, abdominal pain, and hematochezia.    Physical Exam  General:  alert, well-developed, well-nourished, well-hydrated, and overweight-appearing.   Nose:  no nasal discharge.   Mouth:  pharynx pink and moist.   Neck:  supple.   Lungs:  normal respiratory effort, normal breath sounds, no crackles, and no wheezes.   Heart:  normal rate,  regular rhythm, no murmur, and no gallop.   Abdomen:  soft, non-tender, normal bowel sounds, and no distention.   Msk:  Right shoulder tenderness to palpation, worse on movement, no erythema or swelling.  Pulses:  2+ Extremities:  No edema.  Neurologic:  alert & oriented X3, cranial nerves II-XII intact, strength normal in all extremities, sensation intact to light touch, and gait normal.     Impression & Recommendations:  Problem # 1:  SHOULDER PAIN, RIGHT (ICD-719.41) Assessment Improved Her shoulder pain is only slightly better, not  use heating pad or cyclobenzaprine. This is ilkely due to musculoskeletal strain or OA. Will  increase her naprosyn dose and advised to use heating pad, cyclobenzaprine and tylenol. Continue to exercise as tolerates. If no improvement after 3 weeks treatment, may need X-Ray. She said shw will got to her pharmacy to get  all her meds.  Her updated medication list for this problem includes:    Cyclobenzaprine Hcl 10 Mg Tabs (Cyclobenzaprine hcl) .[name]... Take one tablet three times a day over the weekend.  take one tablet at bedtime next week.    Naprosyn 250 Mg Tabs (Naproxen) .[name]... Take two tablets two times a day as needed for shoulder pain.  Problem # 2:  HYPERTENSION (ICD-401.9) Assessment: Improved Her BP is better than last visit. This may be due to poorly controlled pain, Will not change her meds at this time. Wil recheck at next visit.  Her updated medication list for this problem includes:    Hydrochlorothiazide 25 Mg Tabs (Hydrochlorothiazide) .[name]... Take 1 tablet by mouth once a day    Lisinopril 40 Mg Tabs (Lisinopril) .[name]... Take 1 tablet by mouth once a day  BP today: 153/85 Prior BP: 173/84 (02/24/2010)  Labs Reviewed: K+: 4.2 (02/24/2010) Creat: : 0.96 (02/24/2010)   Chol: 175 (02/24/2010)   HDL: 43 (02/24/2010)   LDL: 90 (02/24/2010)   TG: 208 (02/24/2010)  Problem # 3:  DIABETES MELLITUS, TYPE II (ICD-250.00) Assessment: Unchanged Weel controlled and continue current meds.  Her updated medication list for this problem includes:    Lisinopril 40 Mg Tabs (Lisinopril) .[name]... Take 1 tablet by mouth once a day    Metformin Hcl 1000 Mg Tabs (Metformin hcl) .[name]... Take 1 tablet by mouth two times a day    Glipizide Xl 10 Mg Xr24h-tab (Glipizide) .[name]... Take one tablet daily to lower blood sugars.  Labs Reviewed: Creat: 0.96 (02/24/2010)     Last Eye Exam: Glaucoma.    (05/10/2009) Reviewed HgBA1c results: 6.5 (02/24/2010)  8.5 (11/22/2009)  Complete Medication List: 1)  Plavix 75 Mg Tabs (Clopidogrel bisulfate) .... Take one tablet daily to prevent stroke. 2)  1st Choice Pen Needles 31g X 8 Mm Misc (Insulin pen needle) .... Use each day with pen 3)  Hydrochlorothiazide 25 Mg Tabs (Hydrochlorothiazide) .... Take 1 tablet by mouth once a day 4)  Lisinopril 40 Mg Tabs (Lisinopril) .... Take 1  tablet by mouth once a day 5)  Metformin Hcl 1000 Mg Tabs (Metformin hcl) .... Take 1 tablet by mouth two times a day 6)  Bayer Contour Monitor W/device Kit (Blood glucose monitoring suppl) .... Use to check sugar two times a day before meals 7)  Wellbutrin Xl 150 Mg Xr24h-tab (Bupropion hcl) .... Take one tablet by mouth in the morning then increase to one tablet by mouth twice daily after one week. 8)  Zocor 80 Mg Tabs (Simvastatin) .... Take one tablet daily to decrease cholesterol. 9)  Glipizide Xl  10 Mg Xr24h-tab (Glipizide) .... Take one tablet daily to lower blood sugars. 10)  Cyclobenzaprine Hcl 10 Mg Tabs (Cyclobenzaprine hcl) .... Take one tablet three times a day over the weekend.  take one tablet at bedtime next week. 11)  Icy Hot 5 % Pads (Menthol (topical analgesic)) .... Use as directed on package. 12)  Naprosyn 250 Mg Tabs (Naproxen) .... Take two tablets two times a day as needed for shoulder pain.  Patient Instructions: 1)  Please schedule a follow-up appointment in 1 month. 2)  Please take all your medications as instructed. Also use heating/icy pad on your shoulder.  3)  You can go back to work next Monday. 4)  Tobacco is very bad for your health and your loved ones! You Should stop smoking!. 5)  Stop Smoking Tips: Choose a Quit date. Cut down before the Quit date. decide what you will do as a substitute when you feel the urge to smoke(gum,toothpick,exercise).   Prevention & Chronic Care Immunizations   Influenza vaccine: refused  (07/15/2009)   Influenza vaccine deferral: Refused  (04/22/2009)    Tetanus booster: Not documented   Td booster deferral: Refused  (04/22/2009)    Pneumococcal vaccine: Not documented    H. zoster vaccine: Not documented   H. zoster vaccine deferral: Refused  (04/22/2009)  Colorectal Screening   Hemoccult: Not documented   Hemoccult action/deferral: Refused  (04/22/2009)    Colonoscopy: Not documented   Colonoscopy action/deferral:  Refused  (04/22/2009)  Other Screening   Pap smear: Not documented   Pap smear action/deferral: Refused  (04/22/2009)    Mammogram: Not documented   Mammogram action/deferral: Refused  (04/22/2009)    DXA bone density scan: Not documented   Smoking status: quit  (02/24/2010)  Diabetes Mellitus   HgbA1C: 6.5  (02/24/2010)   HgbA1C action/deferral: Ordered  (02/24/2010)    Eye exam: Glaucoma.     (05/10/2009)   Diabetic eye exam action/deferral: Ophthalmology referral  (02/24/2010)   Eye exam due: 06/2009    Foot exam: yes  (09/23/2009)   Foot exam action/deferral: Do today   High risk foot: No  (07/15/2009)   Foot care education: Done  (07/15/2009)    Urine microalbumin/creatinine ratio: 11.7  (12/28/2009)   Urine microalbumin action/deferral: Ordered  Lipids   Total Cholesterol: 175  (02/24/2010)   Lipid panel action/deferral: Lipid Panel ordered   LDL: 90  (02/24/2010)   LDL Direct: Not documented   HDL: 43  (02/24/2010)   Triglycerides: 208  (02/24/2010)    SGOT (AST): 12  (02/24/2010)   BMP action: Ordered   SGPT (ALT): 14  (02/24/2010)   Alkaline phosphatase: 93  (02/24/2010)   Total bilirubin: 0.3  (02/24/2010)  Hypertension   Last Blood Pressure: 153 / 85  (03/10/2010)   Serum creatinine: 0.96  (02/24/2010)   BMP action: Ordered   Serum potassium 4.2  (02/24/2010)  Self-Management Support :   Personal Goals (by the next clinic visit) :     Personal A1C goal: 7  (12/17/2009)     Personal blood pressure goal: 130/80  (12/17/2009)     Personal LDL goal: 70  (12/17/2009)    Patient will work on the following items until the next clinic visit to reach self-care goals:     Medications and monitoring: take my medicines every day  (03/10/2010)     Eating: eat foods that are low in salt, eat baked foods instead of fried foods  (03/10/2010)     Activity: take  a 30 minute walk every day  (02/24/2010)    Diabetes self-management support: Pre-printed educational  material, Resources for patients handout, Written self-care plan  (03/10/2010)   Diabetes care plan printed    Hypertension self-management support: Pre-printed educational material, Resources for patients handout, Written self-care plan  (03/10/2010)   Hypertension self-care plan printed.    Lipid self-management support: Pre-printed educational material, Resources for patients handout, Written self-care plan  (03/10/2010)   Lipid self-care plan printed.      Resource handout printed.

## 2010-10-18 NOTE — Progress Notes (Signed)
Summary: refill/ hla  Phone Note Refill Request Message from:  Fax from Pharmacy on January 13, 2010 4:52 PM  Refills Requested: Medication #1:  LISINOPRIL 40 MG TABS Take 1 tablet by mouth once a day   Dosage confirmed as above?Dosage Confirmed Initial call taken by: Marin Roberts RN,  January 13, 2010 4:52 PM    Prescriptions: LISINOPRIL 40 MG TABS (LISINOPRIL) Take 1 tablet by mouth once a day  #30 x 6   Entered and Authorized by:   Elby Showers MD   Signed by:   Elby Showers MD on 01/14/2010   Method used:   Electronically to        [facility] (mail-order)             ,          Ph: [phone]       Fax: [phone]   RxID:   [id number]

## 2010-10-18 NOTE — Letter (Signed)
Summary: DOCTOR  ORDER DIABETESTESTING SUPPLIES  DOCTOR  ORDER DIABETESTESTING SUPPLIES   Imported By: Margie Billet 02/15/2010 14:41:37  _____________________________________________________________________  External Attachment:    Type:   Image     Comment:   External Document

## 2010-10-18 NOTE — Progress Notes (Signed)
Summary: Order for equipment  Phone Note Other Incoming   Caller: Derwood Kaplan Summary of Call: Needs orders for Bedside commode. Tube seat and large quad cane.  Order can be faxed to [phone]. Angelina Ok RN  October 14, 2009 4:02 PM  Initial call taken by: Angelina Ok RN,  October 14, 2009 4:02 PM  Follow-up for Phone Call        done

## 2010-10-18 NOTE — Progress Notes (Signed)
Summary: phone/gg  Phone Note Call from Patient   Summary of Call: Pt called wanting to be seen sooner than august so she can have disability paperwork filled out. I returned call and pt not at home.  message left for her to call back. Initial call taken by: Merrie Roof RN,  March 24, 2010 11:13 AM  Follow-up for Phone Call        Pt arrived in clinic today  She needs disability paperwork filled out. [employer] is to send [place] the forms needed.  THere # is  [phone]    I have been unable to reach them at this #.  Will try and reach pt to confirm # Follow-up by: Merrie Roof RN,  March 25, 2010 12:11 PM  Additional Follow-up for Phone Call Additional follow up Details #1::        still unable to reach pt. another message left Additional Follow-up by: Merrie Roof RN,  March 29, 2010 10:03 AM    Additional Follow-up for Phone Call Additional follow up Details #2::    Pt has scheduled appointment tomorrow.  Will clarify number at that time. Follow-up by: Merrie Roof RN,  April 19, 2010 9:27 AM

## 2010-10-18 NOTE — Letter (Signed)
Summary: Generic Letter  [facility]  [address]   [place], [place] [address]   Phone: [phone]  Fax: [phone]    10/08/2009       [employer] [place], [place]   RE:  Rickey Farrier. Vanessa Calderon  DOB:  December 16, 2045    [id number]  To Whom It May Concern:  [name] recently came out of the hospital due to stroke and has many physical deficits including difficulties ambulating, right-sided weakness, and speech problems. She is without family, homebound, cannot drive, and needs door to door pick-up to get to medical appointments, pharmacy, and grocery store.   Unfortunately, she is unable to utilize the bus system and it is unclear if and when she will fully recover from this stroke.   Sincerely,     Elby Showers, MD           Dorothe Pea, LCSW Physician               Clinical Social Worker

## 2010-10-18 NOTE — Progress Notes (Signed)
Summary: med refill/gp  Phone Note Refill Request Message from:  Fax from Pharmacy on Jan 17, 2010 12:11 PM  Refills Requested: Medication #1:  ZOCOR 80 MG TABS Take one tablet daily to decrease cholesterol.  Medication #2:  GLIPIZIDE XL 10 MG XR24H-TAB Take one tablet daily to lower blood sugars.. The pharmacist states pt. requesting Zocor 40mg  not 80mg . [facility] requests new Rx w/90 day supply.   Method Requested: Telephone to Pharmacy Initial call taken by: Chinita Pester RN,  Jan 17, 2010 12:11 PM  Follow-up for Phone Call        Refill approved-nurse to complete    Prescriptions: GLIPIZIDE XL 10 MG XR24H-TAB (GLIPIZIDE) Take one tablet daily to lower blood sugars.  #90 x 6   Entered and Authorized by:   Elby Showers MD   Signed by:   Elby Showers MD on 01/18/2010   Method used:   Telephoned to ...       [facility] (mail-order)             , [place]         Ph:        Fax: [phone]   RxID:   [id number] ZOCOR 80 MG TABS (SIMVASTATIN) Take one tablet daily to decrease cholesterol.  #90 x 3   Entered and Authorized by:   Elby Showers MD   Signed by:   Elby Showers MD on 01/18/2010   Method used:   Telephoned to ...       [facility] (mail-order)             , [place]         Ph:        Fax: [phone]   RxID:   [id number]

## 2010-10-18 NOTE — Miscellaneous (Signed)
Summary: [facility]: Orders  [facility]: Orders   Imported By: Florinda Marker 11/01/2009 14:53:51  _____________________________________________________________________  External Attachment:    Type:   Image     Comment:   External Document

## 2010-10-18 NOTE — Progress Notes (Signed)
Summary: phone/gg  Phone Note Call from Patient   Caller: Patient Summary of Call: Pt called with c/o right shoulder pain.  She is at work and said the pain was worse and she was going to ED after work. I asked pt if she tried the flexeril that was ordered on last visit 6/9 and she has not tried it. She also has not tried topical treatment. I advised she try meds and treatment that were ordered and if not better call back and we will set up PT as suggested in last visit. of course she is free to go to ED as needed. Patient/caller verbalizes understanding of these instructions.  Initial call taken by: Merrie Roof RN,  March 03, 2010 2:27 PM  Follow-up for Phone Call        Sounds good.  Would prefer clinic appt rather than ER but as you said in your note, pt free to do what she feels is best. Follow-up by: Blanch Media MD,  March 03, 2010 3:58 PM

## 2010-10-18 NOTE — Letter (Signed)
Summary: [employer]  [employer]   Imported By: Shon Hough 04/18/2010 15:24:11  _____________________________________________________________________  External Attachment:    Type:   Image     Comment:   External Document

## 2010-10-18 NOTE — Initial Assessments (Signed)
INTERNAL MEDICINE ADMISSION HISTORY AND PHYSICAL  Attending:Dr. Rogelia Boga First Contact: Dr. Claudette Laws [phone] Second Contact: Dr. Threasa Beards [phone] PCP: Dr. Rogelia Boga  CC: Shoulder and chest pain.  HPI: This is a 65 year old female with a past medical history significant for CAD sp CABG and PCI in 2006, CVA Jan. 2011, hypertension and DM, who presents with shoulder pain since her stroke in January as well as a 2 day history of chest pain.  With regards to the patients shoulder pain, it developed without known trauma, and is described as dull and achy.  The pain radiates down the arm and was acutely worsened two weeks ago when the patient tripped and fell on the shoulder.  The pain is now constant and is rated as a 9/10 in severity and occasionally worsens to a 10/10.  Yesterday, the patient developed chest pain, associated with "hot flashes" and diaphoreses.  The patient denies any prior occurrence of similar chest pain and it has occured twice thus far.  During both events the patient was at rest.  The pain was centered in the right chest and was described as a stabbing 10/10 pain with radiation into the right shoulder and arm.  The patient described mild nausea during the event, but denied any vomiting, sob, jaw pain, or pleuritic chest pain.  The pain was exacerbated by moment and each episode reportedly lasted 5-10 mins. The patient denies any recent fever, or change in her BM's, however, she has about a 1 month hx of mild cough productive of clear sputum with no sick contacts or hemoptysis.   In the ED the patient continued to have chest pain which was relieved by SL nitroglycerin.  The patient also recieved morphine which improved her shoulder pain. She was given aspirin, and was also given KCL for hypokalemia (3.3).   ALLERGIES: ! PCN - rash ! CODEINE - rash  PAST MEDICAL HISTORY: Coronary artery disease s/p CABGand PCI  in 2006 (Dr. Verdis Prime) 2D echo 08/2009 Left ventricle: There was mild  concentric hypertrophy. Systolic     function was normal. The estimated ejection fraction was in the     range of 55% to 60%. There was basal inferior wall akinesis.     Post-op septal wall dyssynchrony noted. Doppler parameters are     consistent with abnormal left ventricular relaxation (grade 1     diastolic dysfunction).   - Mitral valve: Mild regurgitation.   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 Mass path revealed normal cervix with hydrosalpinx   MEDICATIONS: PLAVIX 75 MG TABS (CLOPIDOGREL BISULFATE) Take one tablet daily to prevent stroke. 1ST CHOICE PEN NEEDLES 31G X 8 MM MISC (INSULIN PEN NEEDLE) use each day with pen LISINOPRIL 40 MG TABS (LISINOPRIL) Take 1 tablet by mouth once a day METFORMIN HCL 1000 MG TABS (METFORMIN HCL) Take 1 tablet by mouth two times a day BAYER CONTOUR MONITOR W/DEVICE KIT (BLOOD GLUCOSE MONITORING SUPPL) use to check sugar two times a day before meals ZOCOR 80 MG TABS (SIMVASTATIN) Take one tablet daily to decrease cholesterol. ICY HOT 5 % PADS (MENTHOL (TOPICAL ANALGESIC)) Use as directed on package. 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    Former Smoker 1/2 PPD x 30 years Alcohol use-no Regular exercise-no She has health insurance   FAMILY HISTORY Family History: Mother- Htn, dementia, died of leukemia Father- DM Daughter- congenital heart disease  ROS: Negative as per hpi.  VITALS: T: 98.4 P:80  BP:164/71  R: 22  O2SAT: 97% ON:RA  PHYSICAL EXAM: General:  alert, well-developed, and  cooperative to examination but with mild dysarthria due to stroke.   Head:  normocephalic and atraumatic.   Eyes: pupils equal, pupils round, pupils reactive to light, no injection and anicteric, upon confrontation, pt has a left visual field deficit in her left eye, right eye visual field was full to confrontation.   Mouth:  pharynx pink and moist, no erythema, and no exudates.   Neck:  supple, full ROM, no thyromegaly, no JVD, and no carotid bruits.   Lungs:  normal respiratory effort, no accessory muscle use, normal breath sounds, no crackles, and no wheezes.  Heart:  Chest pain was reproducible with palpation below the clavicle, normal rate, regular rhythm, no murmur, no gallop, and no rub.   Abdomen:  soft, non-tender, normal bowel sounds, no distention, no guarding, no rebound tenderness, no hepatomegaly, and no splenomegaly.   Msk:  no joint swelling, no joint warmth, and no redness over joints.   Pulses:  2+ DP/PT pulses bilaterally Extremities:  No cyanosis, clubbing, edema Shoulder exam: Tender to palpation along joint space without effusion or overlying erythema.  Limited range of motion to both active and passive motion. supination/pronation of the extended arm also elicits shoulder pain.  Neurologic:  alert & oriented X3, cranial nerves II-XII intact, 3/5 grip strength on left due to pts. stroke, 4/5 right hip and leg extension. 4/5 right foot dorsiflexion.  4/5 strength in right deltoid, biceps and triceps due to pain, normal strength throughout other limbs. sensation intact to light touch  Skin:  turgor normal and no rashes.   Psych:  Oriented X3, memory intact for recent and remote, normally interactive, good eye contact, not anxious appearing, and not depressed  appearing.   LABS:  CBC WBC                                      10.8       h      4.0-10.5         K/uL  RBC                                      3.69       l      3.87-5.11        MIL/uL  Hemoglobin (HGB)                          11.9       l      12.0-15.0        g/dL  Hematocrit (HCT)                         36.3              36.0-46.0        %  MCV  98.4              78.0-100.0       fL  MCH -                                    32.4              26.0-34.0        pg  MCHC                                     32.9              30.0-36.0        g/dL  RDW                                      13.8              11.5-15.5        %  Platelet Count (PLT)                     274               150-400          K/uL  Neutrophils, %                           72                43-77            %  Lymphocytes, %                           21                12-46            %  Monocytes, %                             6                 3-12             %  Eosinophils, %                           0                 0-5              %  Basophils, %                             0                 0-1              %  Neutrophils, Absolute                    7.8        h      1.7-7.7  K/uL  Lymphocytes, Absolute                    2.2               0.7-4.0          K/uL  Monocytes, Absolute                      0.7               0.1-1.0          K/uL  Eosinophils, Absolute                    0.0               0.0-0.7          K/uL  Basophils, Absolute                      0.0               0.0-0.1          K/uL  BMET  Sodium (NA)                              136               135-145          mEq/L  Potassium (K)                            3.3        l      3.5-5.1          mEq/L  Chloride                                 104               96-112           mEq/L  CO2                                      25                19-32            mEq/L  Glucose                                  217        h      70-99            mg/dL  BUN                                      13                6-23             mg/dL  Creatinine  0.78              0.4-1.2           mg/dL  GFR, Est Non African American            >60               >60              mL/min  GFR, Est African American                >60               >60              mL/min    Oversized comment, see footnote  1  Calcium                                  9.2               8.4-10.5         mg/dL  CARDIAC MARKERS CKMB, POC                                <1.0       l      1.0-8.0          ng/mL  Troponin I, POC                          <0.05             0.00-0.09        ng/mL  Myoglobin, POC                           48.5              12-200           ng/mL  STUDIES:  Chest X-ray   Findings: There are changes of median sternotomy for CABG.  Heart   size is upper normal and stable.  Pulmonary vascularity is normal.   The lungs are clear.  Negative for pleural fluid.  Bony thorax is   unremarkable.    IMPRESSION:   No acute cardiopulmonary disease. Prior CABG.  Shoulder plain film: Findings: Mineralization and alignment are normal.  There is no   evidence of acute fracture or dislocation.  The subacromial space   is preserved.  There are mild acromioclavicular degenerative   changes.  There has been previous median sternotomy.    IMPRESSION:   Stable from prior study 3 weeks ago.  No acute findings.  EKG: Normal sinus rhythm, T wave inversions in leads V4-V6 not previously noted in June of this year.   ASSESSMENT AND PLAN: This is a [age] year old with a history of CAD, HTN, and DM who presents with chest and shoulder pain in the setting of a EKG with non-specific T wave changes in th lateral leads, and negative first round of cardiac enzymes.    Chest pain: Given the patient clinical presentation thus far ACS is less likely.  The pain appears to be secondary to clavicular/shoulder pain at this point (given it reproducibility on exam).  The pain may be related to the acromioclavicular degenerative changes noted on X-ray. Costochondritis would  also be high on the differential.  Although  the patient has an elevated white count (10.8) and hx of cough, pneumonia is unlikely given this time given physical exam findings and lack of any pleuritic chest pain. The patients cough is more likely of viral etiology given chronic nature without significant illness. Pts denied any chest pain on examination, however, we will continue to monitor closely.   Plan:     -Will admit to telemetry      -Continue cycling cardiac enzymes x3 q8h     -EKG when patient arrives to floor     -Continue SL nitroglycerin/morphine as needed.       -Pt currently on aspirin and ACEI, Although pt has some akinesis on last echo,        she has not been diagnosed with CHF and we will not start a BB now.     -Continue O2 by [place].     -Will get BMP/CBC in AM Shoulder pain:  In the setting of a chronic monoarticular arthritis, infectious etiologies are unlikely.  It is possible that the patients pain is arising from her [facility] joint though that is difficult to assess by physical exam because she is diffusely tender to palpation.  Given the patients recent stroke, contractures are also in the differential as are muscle/ligamentous tears.  At this point, if further work up is required MRI of the should could be considered.  Will continue IV morphine for pain at this point. DM: Pts last A1C was 6.5 in 6/11 thus we will not retest.  Will hold metformin for now incase heart catheterization is needed tomorrow.  Will place patient on moderate sliding scale for now.  Leukocytosis: Was 10.8 today, review of the medical record demonstrates that the patient was 11.1 in April for hospital follow up in the [facility].  Pts WBC has actually ben elevated in all occurrences of the  outpatient EMR other than one.  We will continue to monitor and consider infectious work up if pt begins to decompensate. PPX: Lovenox and protonix FEN: NSL, replete electrolytes as needed, regular diet.       Attending Physician: I have seen and examined the patient. I  reviewed the resident/fellow note and agree with the findings and plan of care as documented. My additions and revisions are included.     Signature:  Date:

## 2010-10-18 NOTE — Medication Information (Signed)
Summary: Tax adviser   Imported By: Florinda Marker 10/18/2009 16:35:29  _____________________________________________________________________  External Attachment:    Type:   Image     Comment:   External Document

## 2010-10-18 NOTE — Miscellaneous (Signed)
Summary: [facility] CERTIFICATION  [facility] CERTIFICATION   Imported By: Shon Hough 05/19/2010 12:44:01  _____________________________________________________________________  External Attachment:    Type:   Image     Comment:   External Document

## 2010-10-18 NOTE — Assessment & Plan Note (Signed)
Summary: EST-1 MONTH RECHECK/CH   Vital Signs:  Patient profile:   65 year old female Height:      66 inches Weight:      194.8 pounds BMI:     31.56 Temp:     97.8 degrees F oral Pulse rate:   81 / minute BP sitting:   155 / 83  (right arm)  Vitals Entered By: Filomena Jungling NT II (November 22, 2009 11:43 AM) CC: follow-up visit,  Is Patient Diabetic? Yes Did you bring your meter with you today? No Nutritional Status BMI of > 30 = obese CBG Result 215  Does patient need assistance? Functional Status Self care Ambulation Impaired:Risk for fall, Wheelchair Comments walks with cane   Primary Care Provider:  Mariea Stable MD  CC:  follow-up visit and .  History of Present Illness: 58 yof with pmh listed in chart.  She has had a recent CVA, we are getting secondary risk factors under control and she is recieving home health PT/OT/ST.  She presents an hour late and she has vomited three times in the waiting room, she smells of feces and has had diarrhea in her clothes.  She reports that these symptoms came on suddenly, she was feeling well yesterday with no n/v/d.  She has not eaten this morning.  Preventive Screening-Counseling & Management  Alcohol-Tobacco     Alcohol drinks/day: 0     Smoking Status: quit     Smoking Cessation Counseling: no     Packs/Day: <0.25     Year Quit: ?  Caffeine-Diet-Exercise     Does Patient Exercise: no  Medications Prior to Update: 1)  Plavix 75 Mg Tabs (Clopidogrel Bisulfate) .... Take One Tablet Daily To Prevent Stroke. 2)  1st Choice Pen Needles 31g X 8 Mm Misc (Insulin Pen Needle) .... Use Each Day With Pen 3)  Hydrochlorothiazide 25 Mg Tabs (Hydrochlorothiazide) .... Take 1 Tablet By Mouth Once A Day 4)  Lisinopril 40 Mg Tabs (Lisinopril) .... Take 1 Tablet By Mouth Once A Day 5)  Relion Insulin Syringe 30g X 5/16" 1 Ml Misc (Insulin Syringe-Needle U-100) .... Use To Inject Two Times A Day. 6)  Metformin Hcl 1000 Mg Tabs (Metformin Hcl)  .... Take 1 Tablet By Mouth Two Times A Day 7)  Bayer Contour Monitor W/device Kit (Blood Glucose Monitoring Suppl) .... Use To Check Sugar Two Times A Day Before Meals 8)  Wellbutrin Xl 150 Mg Xr24h-Tab (Bupropion Hcl) .... Take One Tablet By Mouth in The Morning Then Increase To One Tablet By Mouth Twice Daily After One Week. 9)  Norvasc 5 Mg Tabs (Amlodipine Besylate) .... Take 1 Tablet By Mouth Once A Day 10)  Zocor 40 Mg Tabs (Simvastatin) .... Take One Tablet Daily To Lower Cholesterol. 11)  Glipizide Xl 10 Mg Xr24h-Tab (Glipizide) .... Take One Tablet Daily To Lower Blood Sugars.  Allergies (verified): 1)  ! Pcn 2)  ! Codeine  Review of Systems       per hpi  Physical Exam  General:  pale and disheveled.     Impression & Recommendations:  Problem # 1:  NAUSEA WITH VOMITING (ICD-787.01)  acute onset vomiting.  Will admit as she is a frail patient with limited capacity to take care of herself.  Concern is for norwalk virus.  Will get standard labs CMET, CBC, lipase, UA.  Will start IV fluids and zofran.  Will admit to regular bed.    Orders: T-CMP [facility]) (80053-[id number]) T-CBC  with Diff [facility]) (85027-CBCD) T-Lipase [facility]) (83690-LIPASE) T-Culture, Blood Routine [phone]) T-Culture, Blood Routine ([id number]) Admin of Therapeutic Inj  intravenous (30865) Zofran 1mg . injection (H8469) Admin of Therapeutic Inj  intravenous (62952)  Problem # 2:  DIARRHEA (ICD-787.91) see problem number one.  Complete Medication List: 1)  Plavix 75 Mg Tabs (Clopidogrel bisulfate) .... Take one tablet daily to prevent stroke. 2)  1st Choice Pen Needles 31g X 8 Mm Misc (Insulin pen needle) .... Use each day with pen 3)  Hydrochlorothiazide 25 Mg Tabs (Hydrochlorothiazide) .... Take 1 tablet by mouth once a day 4)  Lisinopril 40 Mg Tabs (Lisinopril) .... Take 1 tablet by mouth once a day 5)  Relion Insulin Syringe 30g X 5/16" 1 Ml Misc (Insulin syringe-needle u-100) .... Use to  inject two times a day. 6)  Metformin Hcl 1000 Mg Tabs (Metformin hcl) .... Take 1 tablet by mouth two times a day 7)  Bayer Contour Monitor W/device Kit (Blood glucose monitoring suppl) .... Use to check sugar two times a day before meals 8)  Wellbutrin Xl 150 Mg Xr24h-tab (Bupropion hcl) .... Take one tablet by mouth in the morning then increase to one tablet by mouth twice daily after one week. 9)  Norvasc 5 Mg Tabs (Amlodipine besylate) .... Take 1 tablet by mouth once a day 10)  Zocor 40 Mg Tabs (Simvastatin) .... Take one tablet daily to lower cholesterol. 11)  Glipizide Xl 10 Mg Xr24h-tab (Glipizide) .... Take one tablet daily to lower blood sugars.  Other Orders: T- Capillary Blood Glucose (84132) T-Hgb A1C (in-house) ([id number])  Process Orders Check Orders Results:     [facility]: ABN not required for this insurance Tests Sent for requisitioning (November 24, 2009 1:13 PM):     11/22/2009: [facility] -- T-Culture, Blood Routine [[id number]] (signed)     11/22/2009: [facility] -- T-Culture, Blood Routine [[id number]] (signed)    Prevention & Chronic Care Immunizations   Influenza vaccine: refused  (07/15/2009)   Influenza vaccine deferral: Refused  (04/22/2009)    Tetanus booster: Not documented   Td booster deferral: Refused  (04/22/2009)    Pneumococcal vaccine: Not documented    H. zoster vaccine: Not documented   H. zoster vaccine deferral: Refused  (04/22/2009)  Colorectal Screening   Hemoccult: Not documented   Hemoccult action/deferral: Refused  (04/22/2009)    Colonoscopy: Not documented   Colonoscopy action/deferral: Refused  (04/22/2009)  Other Screening   Pap smear: Not documented   Pap smear action/deferral: Refused  (04/22/2009)    Mammogram: Not documented   Mammogram action/deferral: Refused  (04/22/2009)    DXA bone density scan: Not documented   Smoking status: quit  (11/22/2009)  Diabetes  Mellitus   HgbA1C: 8.5  (11/22/2009)   HgbA1C action/deferral: Deferred  (04/22/2009)    Eye exam: Glaucoma.     (05/10/2009)   Diabetic eye exam action/deferral: Refused  (04/22/2009)   Eye exam due: 06/2009    Foot exam: yes  (09/23/2009)   Foot exam action/deferral: Do today   High risk foot: No  (07/15/2009)   Foot care education: Done  (07/15/2009)    Urine microalbumin/creatinine ratio: 44.7  (05/11/2008)   Urine microalbumin action/deferral: Ordered    Diabetes flowsheet reviewed?: Yes   Progress toward A1C goal: Deteriorated  Lipids   Total Cholesterol: 247  (10/08/2009)   LDL: 156  (10/08/2009)   LDL Direct: Not documented   HDL: 44  (10/08/2009)   Triglycerides: 234  (  10/08/2009)    SGOT (AST): 14  (10/08/2009)   SGPT (ALT): 14  (10/08/2009)   Alkaline phosphatase: 102  (10/08/2009)   Total bilirubin: 0.4  (10/08/2009)  Hypertension   Last Blood Pressure: 155 / 83  (11/22/2009)   Serum creatinine: 1.60  (10/08/2009)   Serum potassium 4.4  (10/08/2009)  Self-Management Support :    Diabetes self-management support: Not documented    Hypertension self-management support: Not documented    Lipid self-management support: Not documented    Laboratory Results   Blood Tests   Date/Time Received: November 22, 2009 11:56 AM  Date/Time Reported: Burke Keels  November 22, 2009 11:56 AM   HGBA1C: 8.5%   (Normal Range: Non-Diabetic - 3-6%   Control Diabetic - 6-8%) CBG Random:: 215mg /dL       Medication Administration  Injection # 1:    Medication: Zofran 1mg . injection    Diagnosis: NAUSEA WITH VOMITING (ICD-787.01)    Route: IV    Site: left hand IV site    Exp Date: 06/19/2011    Lot #: [id number]    Mfr: Nova plus    Comments: Zofran 4 mg given slow IVP    Patient tolerated injection without complications    Given by: Dorie Rank RN (November 22, 2009 1:46 PM)  Infusion # 1:    Diagnosis: NAUSEA WITH VOMITING (ICD-787.01)    Started: 1:30 P     Solution: 0.9% NS    Instructions: 125 cc/hr    Route: IV infusion    Site: L hand    Ordered by: Dr. Clent Ridges    Administered by: Dorie Rank RN-November 22, 2009 1:41 PM    Comments: IV started on 1st attempt by this RN after 2 attempts by Marin Roberts, RN - IV started by this RN with 22 G IW to left hand with excellent blood - site WNL- 1000cc NS infusing well    Patient tolerated infusion without complications  Orders Added: 1)  T- Capillary Blood Glucose [82948] 2)  T-Hgb A1C (in-house) [[id number]] 3)  T-CMP [facility]) [[id number]] 4)  T-CBC with Diff [facility]) [[id number]] 5)  T-Lipase [facility]) [[id number]] 6)  T-Culture, Blood Routine [[id number]] 7)  T-Culture, Blood Routine [[id number]] 8)  Admin of Therapeutic Inj  intravenous [96374] 9)  Zofran 1mg . injection [[id number]] 10)  Admin of Therapeutic Inj  intravenous [30865]

## 2010-10-18 NOTE — Progress Notes (Signed)
Summary: refill/gg  Phone Note Refill Request  on March 25, 2010 12:12 PM  Refills Requested: Medication #1:  PLAVIX 75 MG TABS Take one tablet daily to prevent stroke. Pt requested IBU but that is not on med list   Method Requested: Mail to Pharmacy Initial call taken by: Merrie Roof RN,  March 25, 2010 12:14 PM  Follow-up for Phone Call       Follow-up by: Blanch Media MD,  March 25, 2010 12:18 PM    Prescriptions: PLAVIX 75 MG TABS (CLOPIDOGREL BISULFATE) Take one tablet daily to prevent stroke.  #32 x 6   Entered and Authorized by:   Blanch Media MD   Signed by:   Blanch Media MD on 03/25/2010   Method used:   Faxed to ...       [facility] (mail-order)             , [place]         Ph:        Fax: [phone]   RxID:   [id number]

## 2010-10-18 NOTE — Discharge Summary (Signed)
Summary: Hospital Discharge Update    Hospital Discharge Update:  Date of Admission: 04/06/2010 Date of Discharge: 04/08/2010  Brief Summary:  Patient is a 65 y/o female who presented to the [facility] ED with a subacute stroke.  Two days prior to admission she had run out of her plavix and not refilled the prescription due to cost.  She presented with the chief complaint of chest and shoulder pain, but was found to have the subacute stroke.  CT showed no bleed; the patient did not tolerate MRI.  She is to follow-up with Dr. Danelle Berry on August 3 at 3pm in the [facility].  At that time, it will need to be determined that she has been taking her plavix daily, a detailed neurologic examination should be conducted to evaluate her right sided weakness, and she will need to be evaluated to see if she has been able to tolerate starting the low dose beta blocker (metoprolol 12.5mg  by mouth BID), as she has had issues with these medications in the past.  Other labs needed at follow-up: Lipid panel  Problem list changes:  Changed problem from CVA (ICD-434.91) to History of  CVA (ICD-434.91)  Medication list changes:  Removed medication of HYDROCHLOROTHIAZIDE 25 MG TABS (HYDROCHLOROTHIAZIDE) Take 1 tablet by mouth once a day Removed medication of WELLBUTRIN XL 150 MG XR24H-TAB (BUPROPION HCL) Take one tablet by mouth in the morning then increase to one tablet by mouth twice daily after one week. Removed medication of GLIPIZIDE XL 10 MG XR24H-TAB (GLIPIZIDE) Take one tablet daily to lower blood sugars. Removed medication of CYCLOBENZAPRINE HCL 10 MG TABS (CYCLOBENZAPRINE HCL) Take one tablet three times a day over the weekend.  Take one tablet at bedtime next week. Added new medication of ASPIRIN 81 MG TABS (ASPIRIN) Take 1 tablet by mouth once a day - Signed Removed medication of NAPROSYN 250 MG TABS (NAPROXEN) Take two tablets two times a day as needed for shoulder pain. Added new medication of  IBUPROFEN 800 MG TABS (IBUPROFEN) Take 1 tablet by mouth every 8 hours as needed for shoulder pain - Signed Added new medication of METOPROLOL TARTRATE 25 MG TABS (METOPROLOL TARTRATE) Take one half tablet by mouth twice daily - Signed Rx of PLAVIX 75 MG TABS (CLOPIDOGREL BISULFATE) Take one tablet daily to prevent stroke.;  #30 x 6;  Signed;  Entered by: Danelle Berry, MD;  Authorized by: Danelle Berry, MD;  Method used: Faxed to [facility] , , [place]  , Ph: , Fax: [phone] Rx of ASPIRIN 81 MG TABS (ASPIRIN) Take 1 tablet by mouth once a day;  #30 x 6;  Signed;  Entered by: Danelle Berry, MD;  Authorized by: Danelle Berry, MD;  Method used: Faxed to [facility], , , [place]  , Ph: , Fax: [phone] Rx of IBUPROFEN 800 MG TABS (IBUPROFEN) Take 1 tablet by mouth every 8 hours as needed for shoulder pain;  #90 x 6;  Signed;  Entered by: Danelle Berry, MD;  Authorized by: Danelle Berry, MD;  Method used: Faxed to [facility], , , [place]  , Ph: , Fax: [phone] Rx of METOPROLOL TARTRATE 25 MG TABS (METOPROLOL TARTRATE) Take one half tablet by mouth twice daily;  #30 x 6;  Signed;  Entered by: Danelle Berry, MD;  Authorized by: Danelle Berry, MD;  Method used: Faxed to [facility], , , [place]  , Ph: , Fax: [phone]  The medication, problem, and allergy lists have been updated.  Please see the  dictated discharge summary for details.  Discharge medications:  PLAVIX 75 MG TABS (CLOPIDOGREL BISULFATE) Take one tablet daily to prevent stroke. 1ST CHOICE PEN NEEDLES 31G X 8 MM MISC (INSULIN PEN NEEDLE) use each day with pen LISINOPRIL 40 MG TABS (LISINOPRIL) Take 1 tablet by mouth once a day METFORMIN HCL 1000 MG TABS (METFORMIN HCL) Take 1 tablet by mouth two times a day BAYER CONTOUR MONITOR W/DEVICE KIT (BLOOD GLUCOSE MONITORING SUPPL) use to check sugar two times a day before meals ZOCOR 80 MG TABS (SIMVASTATIN) Take one tablet daily to decrease cholesterol. ICY HOT 5 % PADS (MENTHOL (TOPICAL ANALGESIC)) Use  as directed on package. ASPIRIN 81 MG TABS (ASPIRIN) Take 1 tablet by mouth once a day IBUPROFEN 800 MG TABS (IBUPROFEN) Take 1 tablet by mouth every 8 hours as needed for shoulder pain METOPROLOL TARTRATE 25 MG TABS (METOPROLOL TARTRATE) Take one half tablet by mouth twice daily  Other patient instructions:  Please go to your pharmacy and obtain your 14 day supply of Plavix.  Your prescription for the remainder of your plavix was faxed to [facility] and should arrive in the mail.  It is vital that you take your plavix every day to prevent more strokes.  If you feel any new numbness or weakness or notice any symptoms of stroke, please call 911 immediately and come to the emergency department.    Your follow-up appointment is on Wednesday, August 3 with Dr. Claudette Laws at 3pm in the outpatient medicine clinic.   Note: Hospital Discharge Medications & Other Instructions handout was printed, one copy for patient and a second copy to be placed in hospital chart.

## 2010-10-18 NOTE — Assessment & Plan Note (Signed)
Summary: EST-1 MONTH F/U VISIT/CH   Vital Signs:  Patient profile:   65 year old female Height:      66 inches Weight:      200.3 pounds BMI:     32.45 Temp:     98.4 degrees F oral Pulse rate:   95 / minute BP sitting:   173 / 84  (right arm)  Vitals Entered By: Filomena Jungling NT II (February 24, 2010 3:30 PM) CC: neck and shoulder pain over a month Is Patient Diabetic? No Pain Assessment Patient in pain? yes     Location: neck,and shoulder Intensity: 4 Type: aching Onset of pain  1 month Nutritional Status BMI of 25 - 29 = overweight CBG Result 92  Have you ever been in a relationship where you felt threatened, hurt or afraid?No   Does patient need assistance? Functional Status Self care Ambulation Normal   Primary Care Provider:  Mariea Stable MD  CC:  neck and shoulder pain over a month.  History of Present Illness: 63yof with pmh outlined in chart here for an acute visit with 10/10 right shoulder and neck pain for 2 weeks.  She woke up with the pain one day and it has persisted.  The only thing that has helped is ibuprofen 800mg  once a day, and she has run out of this.  She can move her neck.  She has restricted motion of her shoulder, is restricted by pain.  Fingers feel funny.   She is going back to work on Monday and wants this pain to be gone by then.  Preventive Screening-Counseling & Management  Alcohol-Tobacco     Alcohol drinks/day: 0     Smoking Status: quit     Smoking Cessation Counseling: no     Packs/Day: <0.25     Year Quit: 1996  Caffeine-Diet-Exercise     Does Patient Exercise: yes     Type of exercise: walking     Times/week: <3  Medications Prior to Update: 1)  Plavix 75 Mg Tabs (Clopidogrel Bisulfate) .... Take One Tablet Daily To Prevent Stroke. 2)  1st Choice Pen Needles 31g X 8 Mm Misc (Insulin Pen Needle) .... Use Each Day With Pen 3)  Hydrochlorothiazide 25 Mg Tabs (Hydrochlorothiazide) .... Take 1 Tablet By Mouth Once A Day 4)   Lisinopril 40 Mg Tabs (Lisinopril) .... Take 1 Tablet By Mouth Once A Day 5)  Metformin Hcl 1000 Mg Tabs (Metformin Hcl) .... Take 1 Tablet By Mouth Two Times A Day 6)  Bayer Contour Monitor W/device Kit (Blood Glucose Monitoring Suppl) .... Use To Check Sugar Two Times A Day Before Meals 7)  Wellbutrin Xl 150 Mg Xr24h-Tab (Bupropion Hcl) .... Take One Tablet By Mouth in The Morning Then Increase To One Tablet By Mouth Twice Daily After One Week. 8)  Zocor 80 Mg Tabs (Simvastatin) .... Take One Tablet Daily To Decrease Cholesterol. 9)  Glipizide Xl 10 Mg Xr24h-Tab (Glipizide) .... Take One Tablet Daily To Lower Blood Sugars. 10)  Ibuprofen 800 Mg Tabs (Ibuprofen) .... Take One Tablet Two Times A Day As Needed For Pain.  Allergies: 1)  ! Pcn 2)  ! Codeine  Review of Systems       per hpi  Physical Exam  General:  alert and well-developed.   Head:  normocephalic and atraumatic.   Eyes:  vision grossly intact, pupils equal, pupils round, and pupils reactive to light.   Mouth:  good dentition and pharynx pink and moist.  Lungs:  normal respiratory effort and normal breath sounds.   Heart:  normal rate, regular rhythm, no murmur, no gallop, and no rub.   Msk:  she has preserved ROM of the neck, but pain with movment.  ROM of the right shoulder is limited by pain. strength in the RUE is 5/5 sensation in the RUE and right face is intact I could not locate a specific muscle in spasm but pain with palpation seems most intense close to the spine at about T9-11. Pulses:  2+ Extremities:  no edema Neurologic:  alert & oriented X3, cranial nerves II-XII intact, strength normal in all extremities, sensation intact to light touch, and gait normal.   Cervical Nodes:  no anterior cervical adenopathy and no posterior cervical adenopathy.   Psych:  Oriented X3, memory intact for recent and remote, normally interactive, good eye contact, and slightly anxious.     Impression &  Recommendations:  Problem # 1:  SHOULDER PAIN, RIGHT (ICD-719.41) New complaint.  She woke up with this 10/10 pain in her right upper back, neck and arm 2 weeks ago. I think she has an acute muscle spasm.  Ibuprofen 800mg  once a day has been working a little.  Her BP is high today, might be due to pain or to ibuprofen.  She has many CAD risk factors.  I will try low dose naproxen because it has a lower cardio risk profile than other NSAIDS and will hopefully not cause renal problems at low dose.  Will also try a muscle relaxer to help with symptoms.  If this regimen does not help by monday she is instructed to call and we will set her up with PT.  Her updated medication list for this problem includes:    Cyclobenzaprine Hcl 10 Mg Tabs (Cyclobenzaprine hcl) .[name]... Take one tablet three times a day over the weekend.  take one tablet at bedtime next week.    Naprosyn 250 Mg Tabs (Naproxen) .[name]... Take one tablet two times a day for neck and shoulder pain.  Problem # 2:  HYPERTENSION (ICD-401.9)  BP is up today, likely due to pain, or possibly due to ibuprofen effect.  change ibuprofen to naproxen and keep at low dose.  Her updated medication list for this problem includes:    Hydrochlorothiazide 25 Mg Tabs (Hydrochlorothiazide) .[name]... Take 1 tablet by mouth once a day    Lisinopril 40 Mg Tabs (Lisinopril) .[name]... Take 1 tablet by mouth once a day  BP today: 173/84 Prior BP: 125/79 (01/17/2010)  Labs Reviewed: K+: 4.2 (12/17/2009) Creat: : 0.96 (12/17/2009)   Chol: 199 (12/28/2009)   HDL: 36 (12/28/2009)   LDL: 123 (12/28/2009)   TG: 202 (12/28/2009)  Problem # 3:  DIABETES MELLITUS, TYPE II (ICD-250.00) will check A1C today and adjust from there.  Her updated medication list for this problem includes:    Lisinopril 40 Mg Tabs (Lisinopril) .[name]... Take 1 tablet by mouth once a day    Metformin Hcl 1000 Mg Tabs (Metformin hcl) .[name]... Take 1 tablet by mouth two times a day    Glipizide Xl 10 Mg  Xr24h-tab (Glipizide) .[name]... Take one tablet daily to lower blood sugars.  Labs Reviewed: Creat: 0.96 (12/17/2009)     Last Eye Exam: Glaucoma.    (05/10/2009) Reviewed HgBA1c results: 8.5 (11/22/2009)  8.2 (10/07/2009)  Orders: T-Hgb A1C (in-house) ([id number]) T- Capillary Blood Glucose ([id number])  Problem # 4:  HYPERLIPIDEMIA (ICD-272.4) changed zocor dose in april.  will recheck lipids today.  Her updated medication list for this problem includes:    Zocor 80 Mg Tabs (Simvastatin) .[name]... Take one tablet daily to decrease cholesterol.  Labs Reviewed: SGOT: 14 (10/08/2009)   SGPT: 14 (10/08/2009)   HDL:36 (12/28/2009), 44 (10/08/2009)  LDL:123 (12/28/2009), 156 (10/08/2009)  Chol:199 (12/28/2009), 247 (10/08/2009)  Trig:202 (12/28/2009), 234 (10/08/2009)  Orders: T-Lipid Profile ([id number])  Complete Medication List: 1)  Plavix 75 Mg Tabs (Clopidogrel bisulfate) .... Take one tablet daily to prevent stroke. 2)  1st Choice Pen Needles 31g X 8 Mm Misc (Insulin pen needle) .... Use each day with pen 3)  Hydrochlorothiazide 25 Mg Tabs (Hydrochlorothiazide) .... Take 1 tablet by mouth once a day 4)  Lisinopril 40 Mg Tabs (Lisinopril) .... Take 1 tablet by mouth once a day 5)  Metformin Hcl 1000 Mg Tabs (Metformin hcl) .... Take 1 tablet by mouth two times a day 6)  Bayer Contour Monitor W/device Kit (Blood glucose monitoring suppl) .... Use to check sugar two times a day before meals 7)  Wellbutrin Xl 150 Mg Xr24h-tab (Bupropion hcl) .... Take one tablet by mouth in the morning then increase to one tablet by mouth twice daily after one week. 8)  Zocor 80 Mg Tabs (Simvastatin) .... Take one tablet daily to decrease cholesterol. 9)  Glipizide Xl 10 Mg Xr24h-tab (Glipizide) .... Take one tablet daily to lower blood sugars. 10)  Cyclobenzaprine Hcl 10 Mg Tabs (Cyclobenzaprine hcl) .... Take one tablet three times a day over the weekend.  take one tablet at bedtime next week. 11)  Icy Hot 5 %  Pads (Menthol (topical analgesic)) .... Use as directed on package. 12)  Naprosyn 250 Mg Tabs (Naproxen) .... Take one tablet two times a day for neck and shoulder pain.  Other Orders: T-Comprehensive Metabolic Panel [id number])  Patient Instructions: 1)  You had labwork done today, we will call you if there is anything that needs to be addressed before your next appointment. 2)  You have a strain in the muscles of your upper back and neck.  You have new prescriptions at the pharmacy for pain.  3)  You should also use a topical patch or cream such as icy-hot for pain relief. If pain is not a little better by Monday, than call and let us know. Prescriptions: CYCLOBENZAPRINE HCL 10 MG TABS (CYCLOBENZAPRINE HCL) Take one tablet three times a day over the weekend.  Take one tablet at bedtime next week.  #20 x 0   Entered and Authorized by:   Elby Showers MD   Signed by:   Elby Showers MD on 02/24/2010   Method used:   Electronically to        [facility] [address] [id number]* (retail)       [address] [place], [place]  [address]       Ph: [phone]       Fax: [phone]   RxID:   [id number] NAPROSYN 250 MG TABS (NAPROXEN) Take one tablet two times a day for neck and shoulder pain.  #20 x 0   Entered and Authorized by:   Elby Showers MD   Signed by:   Elby Showers MD on 02/24/2010   Method used:   Electronically to        [facility] [address] [id number]* (retail)       [address] [place], [place]  [address]       Ph: [phone]  Fax: [phone]   RxID:   [id number]    Prevention & Chronic Care Immunizations   Influenza vaccine: refused  (07/15/2009)   Influenza vaccine deferral: Refused  (04/22/2009)    Tetanus booster: Not documented   Td booster deferral: Refused  (04/22/2009)    Pneumococcal vaccine: Not documented    H. zoster vaccine: Not documented   H. zoster vaccine deferral: Refused  (04/22/2009)  Colorectal Screening    Hemoccult: Not documented   Hemoccult action/deferral: Refused  (04/22/2009)    Colonoscopy: Not documented   Colonoscopy action/deferral: Refused  (04/22/2009)  Other Screening   Pap smear: Not documented   Pap smear action/deferral: Refused  (04/22/2009)    Mammogram: Not documented   Mammogram action/deferral: Refused  (04/22/2009)    DXA bone density scan: Not documented   Smoking status: quit  (02/24/2010)  Diabetes Mellitus   HgbA1C: 6.5  (02/24/2010)   HgbA1C action/deferral: Ordered  (02/24/2010)    Eye exam: Glaucoma.     (05/10/2009)   Diabetic eye exam action/deferral: Ophthalmology referral  (02/24/2010)   Eye exam due: 06/2009    Foot exam: yes  (09/23/2009)   Foot exam action/deferral: Do today   High risk foot: No  (07/15/2009)   Foot care education: Done  (07/15/2009)    Urine microalbumin/creatinine ratio: 11.7  (12/28/2009)   Urine microalbumin action/deferral: Ordered    Diabetes flowsheet reviewed?: Yes   Progress toward A1C goal: Unchanged  Lipids   Total Cholesterol: 199  (12/28/2009)   Lipid panel action/deferral: Lipid Panel ordered   LDL: 123  (12/28/2009)   LDL Direct: Not documented   HDL: 36  (12/28/2009)   Triglycerides: 202  (12/28/2009)    SGOT (AST): 14  (10/08/2009)   BMP action: Ordered   SGPT (ALT): 14  (10/08/2009) CMP ordered    Alkaline phosphatase: 102  (10/08/2009)   Total bilirubin: 0.4  (10/08/2009)    Lipid flowsheet reviewed?: Yes   Progress toward LDL goal: Unchanged  Hypertension   Last Blood Pressure: 173 / 84  (02/24/2010)   Serum creatinine: 0.96  (12/17/2009)   BMP action: Ordered   Serum potassium 4.2  (12/17/2009) CMP ordered     Hypertension flowsheet reviewed?: Yes   Progress toward BP goal: Deteriorated  Self-Management Support :   Personal Goals (by the next clinic visit) :     Personal A1C goal: 7  (12/17/2009)     Personal blood pressure goal: 130/80  (12/17/2009)     Personal LDL goal: 70   (12/17/2009)    Patient will work on the following items until the next clinic visit to reach self-care goals:     Medications and monitoring: take my medicines every day, check my blood pressure  (02/24/2010)     Eating: drink diet soda or water instead of juice or soda, eat more vegetables, use fresh or frozen vegetables, eat foods that are low in salt, eat baked foods instead of fried foods, eat fruit for snacks and desserts  (02/24/2010)     Activity: take a 30 minute walk every day  (02/24/2010)    Diabetes self-management support: Education handout, Resources for patients handout  (02/24/2010)   Diabetes education handout printed    Hypertension self-management support: Education handout, Resources for patients handout  (02/24/2010)   Hypertension education handout printed    Lipid self-management support: Education handout, Resources for patients handout  (02/24/2010)     Lipid education handout printed      Resource handout printed.  Nursing Instructions: HgbA1C today (see order) Refer for screening diabetic eye exam (see order) CBG today (see order)    Laboratory Results   Blood Tests   Date/Time Received: February 24, 2010 4:37 PM Date/Time Reported: Burke Keels  February 24, 2010 4:37 PM   HGBA1C: 6.5%   (Normal Range: Non-Diabetic - 3-6%   Control Diabetic - 6-8%) CBG Random:: 92mg /dL      Process Orders Check Orders Results:     [facility]: ABN not required for this insurance Tests Sent for requisitioning (February 28, 2010 11:40 AM):     02/24/2010: [facility] -- T-Lipid Profile [phone] (signed)     02/24/2010: [facility] -- T-Comprehensive Metabolic Panel [phone] (signed)

## 2010-10-18 NOTE — Miscellaneous (Signed)
Summary: [facility]: Home Health Cert. & Plan Of Care  [facility]: [facility] Health Cert. & Plan Of Care   Imported By: Florinda Marker 11/08/2009 14:03:44  _____________________________________________________________________  External Attachment:    Type:   Image     Comment:   External Document

## 2010-10-18 NOTE — Assessment & Plan Note (Signed)
Summary: Soc. Work   Social Work Evaluation Date  10/08/2009 Patient name Vanessa Calderon  Primary MD   : Mariea Stable MD Social Worker's name : Dorothe Pea MSW- LCSW  Home [phone]  Work phone: [phone]  Cell phone: .  [name]     Alternate phone: . [name]       Individual making referral: Dr. Clent Ridges and Eunice Blase Ditzler  Primary Reason for Referral:     Community Resources for [facility]/Personal Care Services Comments Dgtr is Ruthie at [phone].  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  Needs home health, transit, home delivery of medications, and way to obtain groceries until dgtr can get her to live with her in [place].   Has BCBS as retirement benefit from her job at [employer].   Patient declined [employer] and says can prepare meals on her own.  Ambulation is slow per dgtr.  Action taken by Social Work: Obtaining HH order from [employer].   Will submit [employer] application for transit and in the meantime the dgtr and patient have [employer] phone number and I've explained how to utilize that for medical appmts.   Axel Filler may be able to help her with groceries and errands otherwise will research out volunteer program.  Will try to arrange for home delivery of medications.

## 2010-10-18 NOTE — Miscellaneous (Signed)
Summary: ED visit follow-up  Clinical Lists Changes Patient was senn ED on 03/17/2010 for slurred speech. She has Hx of CVA, but CT did not show acute CVA and no focal neurological findings and her speech is similar with I saw her a week ago. She said she has been speaking as before and I also told to her co-works on phone who said there is no significant change of her speech. Because of her previous strokes and most recent one in 08/2009 and has run out of plavix for 2 days, we asked her to be admitted to the hospital for further evaluation and our attending Dr. Rogelia Boga also talked with her for admission and her risk for CVA. She said she fully understood about these and refused to be admitted. We told her to come to ED if she feels not good and has slurred speech or weakness. We also gave her a box of plavix. She said she will refill plavix tomorrow. She left AMA at ED.   Appended Document: ED visit follow-up I had documented my interaction with Vanessa Calderon yesterday but apparently was deleted. Drs Threasa Beards and Eben Burow informed me of pts decision to leave AMA. When I arrived pt was in street clothes, clutching her purse and told me she had called a coworker to pick her up. My interview was limited bc the friend arrived two or three min later. Pt has h/o CAD, s/p CABG 2006, s/p L CEA 2006, DM, HLD, HTN, and s/p left hemispheric cerebral infarct Dec 2010. Pt out of Plavix for few days. Pt woke up on AM of Er visit, was walking into closet - she fell but did not trip. Sat on floor about 10 min bc R arm and leg wouldn't work properly. Arm and leg then recovered and she was able to walk and drove herself to work. Her coworker said she didn't notice anything wrong with pt. At some point she called Myriam Jacobson who noticed slurred speech. Dr Coralee Pesa also talked to pt and noticed slurred speech. Pt was transported to ER. CT head negative for acute and blood work OK except hyperglycemia. I told pt we were worried about a TIA / CVA  and pt said she wasn't having one bc she would know it if she were. Told pt she was at very high risk of another TIA/CVA in next 2-3 days. Pt stated she was going home bc she had no one to take care of her dog. I offered to call a kennel or dog sitting service and pt got up and started to leave. I asked pt to show me her Plavix that Dr Threasa Beards had gotten her and she did. I told pt that she needed to keep her phone in her hand at all times bc she could have another event and not be able to get to phone. Pt left with her coworker.

## 2010-10-18 NOTE — Assessment & Plan Note (Signed)
Summary: hosp f/u, pt left ama, daughter will accompany pt/pcp-alvarez...   Vital Signs:  Patient profile:   65 year old female Height:      66 inches (167.64 cm) Weight:      200.4 pounds (91.09 kg) BMI:     32.46 Temp:     97.8 degrees F (36.56 degrees C) oral Pulse rate:   86 / minute BP sitting:   137 / 82  (right arm)  Vitals Entered By: Stanton Kidney Ditzler RN (September 23, 2009 9:51 AM) Is Patient Diabetic? Yes Did you bring your meter with you today? No Pain Assessment Patient in pain? no      Nutritional Status BMI of > 30 = obese Nutritional Status Detail appetite down CBG Result 195  Have you ever been in a relationship where you felt threatened, hurt or afraid?denies   Does patient need assistance? Functional Status Self care Ambulation Normal Comments Daughter with pt. HFU - wants referral PT for CVA.   Primary Care Provider:  Mariea Stable MD   History of Present Illness: This is a  year old woman with past medical history of CAD, DM, HTN who is here for hfu after admission for CVA.  Needs referal for PT.  Says that she has been feeling fine since leaving hospital, no compaints.  On further questioning she has a cough with thick phlegm.  May have had one fever (subjective) but appetite and engerygy are intact.    Depression History:      The patient denies a depressed mood most of the day and a diminished interest in her usual daily activities.         Preventive Screening-Counseling & Management  Alcohol-Tobacco     Alcohol drinks/day: 0     Smoking Status: quit     Smoking Cessation Counseling: no     Packs/Day: <0.25     Year Quit: ?  Caffeine-Diet-Exercise     Does Patient Exercise: no  Current Medications (verified): 1)  Aspirin 325 Mg Tabs (Aspirin) .... Take 1 Tablet By Mouth Once A Day 2)  1st Choice Pen Needles 31g X 8 Mm Misc (Insulin Pen Needle) .... Use Each Day With Pen 3)  Hydrochlorothiazide 25 Mg Tabs (Hydrochlorothiazide) .... Take 1  Tablet By Mouth Once A Day 4)  Lisinopril 40 Mg Tabs (Lisinopril) .... Take 1 Tablet By Mouth Once A Day 5)  Relion Insulin Syringe 30g X 5/16" 1 Ml Misc (Insulin Syringe-Needle U-100) .... Use To Inject Two Times A Day. 6)  Metformin Hcl 1000 Mg Tabs (Metformin Hcl) .... Take 1 Tablet By Mouth Two Times A Day 7)  Bayer Contour Monitor W/device Kit (Blood Glucose Monitoring Suppl) .... Use To Check Sugar Two Times A Day Before Meals 8)  Ambien 5 Mg Tabs (Zolpidem Tartrate) .... Take 1 Tab By Mouth At Bedtime 9)  Wellbutrin Xl 150 Mg Xr24h-Tab (Bupropion Hcl) .... Take One Tablet By Mouth in The Morning Then Increase To One Tablet By Mouth Twice Daily After One Week. 10)  Norvasc 5 Mg Tabs (Amlodipine Besylate) .... Take 1 Tablet By Mouth Once A Day  Allergies: 1)  ! Pcn 2)  ! Codeine  Review of Systems       per hpi  Physical Exam  General:  alert and well-developed.   Head:  normocephalic and atraumatic.   Eyes:  vision grossly intact, pupils equal, pupils round, and pupils reactive to light.   Mouth:  good dentition and pharynx pink  and moist.   Lungs:  normal respiratory effort and normal breath sounds.   Heart:  normal rate, regular rhythm, and no murmur.   Msk:  slight weakness on right side in all movements of arm and leg.  sensation is intact. Pulses:  +1 Extremities:  no edema. Neurologic:  alert & oriented X3.  slurring of speech, right sided weakness. Skin:  no suspicious lesions.   Psych:  Oriented X3, memory intact for recent and remote, normally interactive, and good eye contact.    Diabetes Management Exam:    Foot Exam (with socks and/or shoes not present):       Sensory-Monofilament:          Left foot: normal          Right foot: normal   Impression & Recommendations:  Problem # 1:  CVA (ICD-434.91) Not taking plavix yet because she can not afford and is having trouble with her insurance. She is currently taking ASA 325. Gave her 12 days of plavix samples.  Contact info for MAP program so that she can get this medication during the gap in insurance coverage. Will also refer for PT, OT, and speech therapy.  Her updated medication list for this problem includes:    Plavix 75 Mg Tabs (Clopidogrel bisulfate) .[name]... Take one tablet daily to prevent stroke.  Orders: Speech Therapy (Speech Therapy) Physical Therapy Referral (PT)  Problem # 2:  DIABETES MELLITUS, TYPE II (ICD-250.00) cbg's at home 180-185 A1C 12/29 is 7.2 She will rtc in one month and bring her meter to this appointment so that we can change management if needed.   Her updated medication list for this problem includes:    Lisinopril 40 Mg Tabs (Lisinopril) .[name]... Take 1 tablet by mouth once a day    Metformin Hcl 1000 Mg Tabs (Metformin hcl) .[name]... Take 1 tablet by mouth two times a day  Orders: Capillary Blood Glucose/CBG (13086)  Labs Reviewed: Creat: 1.08 (05/27/2009)     Last Eye Exam: Glaucoma.    (05/10/2009) Reviewed HgBA1c results: 6.5 (07/15/2009)  7.6 (04/21/2009)  Problem # 3:  RENAL INSUFFICIENCY, ACUTE (ICD-585.9) needs a BMET.  Did not get today, will need at next appointment.  Problem # 4:  HYPERTENSION (ICD-401.9) BP improved today.  Her updated medication list for this problem includes:    Hydrochlorothiazide 25 Mg Tabs (Hydrochlorothiazide) .[name]... Take 1 tablet by mouth once a day    Lisinopril 40 Mg Tabs (Lisinopril) .[name]... Take 1 tablet by mouth once a day    Norvasc 5 Mg Tabs (Amlodipine besylate) .[name]... Take 1 tablet by mouth once a day  BP today: 137/82 Prior BP: 177/76 (07/15/2009)  Labs Reviewed: K+: 3.9 (05/27/2009) Creat: : 1.08 (05/27/2009)   Chol: 192 (05/03/2009)   HDL: 36 (05/03/2009)   LDL: 87 (05/03/2009)   TG: 345 (05/03/2009)  Complete Medication List: 1)  Plavix 75 Mg Tabs (Clopidogrel bisulfate) .... Take one tablet daily to prevent stroke. 2)  1st Choice Pen Needles 31g X 8 Mm Misc (Insulin pen needle) .... Use each day with  pen 3)  Hydrochlorothiazide 25 Mg Tabs (Hydrochlorothiazide) .... Take 1 tablet by mouth once a day 4)  Lisinopril 40 Mg Tabs (Lisinopril) .... Take 1 tablet by mouth once a day 5)  Relion Insulin Syringe 30g X 5/16" 1 Ml Misc (Insulin syringe-needle u-100) .... Use to inject two times a day. 6)  Metformin Hcl 1000 Mg Tabs (Metformin hcl) .... Take 1 tablet by mouth two  times a day 7)  Designer, multimedia W/device Kit (Blood glucose monitoring suppl) .... Use to check sugar two times a day before meals 8)  Ambien 5 Mg Tabs (Zolpidem tartrate) .... Take 1 tab by mouth at bedtime 9)  Wellbutrin Xl 150 Mg Xr24h-tab (Bupropion hcl) .... Take one tablet by mouth in the morning then increase to one tablet by mouth twice daily after one week. 10)  Norvasc 5 Mg Tabs (Amlodipine besylate) .... Take 1 tablet by mouth once a day  Patient Instructions: 1)  Please schedule a follow-up appointment in 2 weeks.  Bring your glucose meter to the next appointment. 2)  You were given samples of plavix today. Take one tablet daily. 3)  Please contact the MAP program for help with medications. 4)  You will be set up with physical, occupational and speech therapy. Prescriptions: PLAVIX 75 MG TABS (CLOPIDOGREL BISULFATE) Take one tablet daily to prevent stroke.  #32 x 6   Entered and Authorized by:   Elby Showers MD   Signed by:   Elby Showers MD on 09/23/2009   Method used:   Samples Given   RxID:   [id number]    Prevention & Chronic Care Immunizations   Influenza vaccine: refused  (07/15/2009)   Influenza vaccine deferral: Refused  (04/22/2009)    Tetanus booster: Not documented   Td booster deferral: Refused  (04/22/2009)    Pneumococcal vaccine: Not documented    H. zoster vaccine: Not documented   H. zoster vaccine deferral: Refused  (04/22/2009)  Colorectal Screening   Hemoccult: Not documented   Hemoccult action/deferral: Refused  (04/22/2009)    Colonoscopy: Not documented    Colonoscopy action/deferral: Refused  (04/22/2009)  Other Screening   Pap smear: Not documented   Pap smear action/deferral: Refused  (04/22/2009)    Mammogram: Not documented   Mammogram action/deferral: Refused  (04/22/2009)    DXA bone density scan: Not documented   Smoking status: quit  (09/23/2009)  Diabetes Mellitus   HgbA1C: 6.5  (07/15/2009)   HgbA1C action/deferral: Deferred  (04/22/2009)    Eye exam: Glaucoma.     (05/10/2009)   Diabetic eye exam action/deferral: Refused  (04/22/2009)   Eye exam due: 06/2009    Foot exam: yes  (09/23/2009)   Foot exam action/deferral: Do today   High risk foot: No  (07/15/2009)   Foot care education: Done  (07/15/2009)    Urine microalbumin/creatinine ratio: 44.7  (05/11/2008)  Lipids   Total Cholesterol: 192  (05/03/2009)   LDL: 87  (05/03/2009)   LDL Direct: Not documented   HDL: 36  (05/03/2009)   Triglycerides: 345  (05/03/2009)    SGOT (AST): 15  (04/22/2009)   SGPT (ALT): 20  (04/22/2009)   Alkaline phosphatase: 125  (04/22/2009)   Total bilirubin: 0.6  (04/22/2009)  Hypertension   Last Blood Pressure: 137 / 82  (09/23/2009)   Serum creatinine: 1.08  (05/27/2009)   Serum potassium 3.9  (05/27/2009)  Self-Management Support :    Patient will work on the following items until the next clinic visit to reach self-care goals:     Medications and monitoring: take my medicines every day, check my blood sugar, bring all of my medications to every visit, examine my feet every day  (09/23/2009)     Eating: drink diet soda or water instead of juice or soda, eat more vegetables, use fresh or frozen vegetables, eat baked foods instead of fried foods, eat fruit for snacks and desserts, limit or  avoid alcohol  (09/23/2009)     Activity: take a 30 minute walk every day, take the stairs instead of the elevator  (09/23/2009)    Diabetes self-management support: Not documented    Hypertension self-management support: Not  documented    Lipid self-management support: Not documented    Last LDL:                                                 87 (05/03/2009 9:18:00 PM)        Diabetic Foot Exam Foot Inspection Is there a history of a foot ulcer?              No Is there a foot ulcer now?              No Can the patient see the bottom of their feet?          Yes Are the shoes appropriate in style and fit?          Yes Is there swelling or an abnormal foot shape?          No Are the toenails long?                No Are the toenails thick?                No Are the toenails ingrown?              No Is there heavy callous build-up?              No Is there a claw toe deformity?                          No Is there elevated skin temperature?            No Is there limited ankle dorsiflexion?            No Is there foot or ankle muscle weakness?            No Do you have pain in calf while walking?           No         10-g (5.07) Semmes-Weinstein Monofilament Test Performed by: Stanton Kidney Ditzler RN          Right Foot          Left Foot Visual Inspection     normal         normal Test Control      normal         normal Site 1         normal         normal Site 2         normal         normal Site 3         normal         normal Site 4         normal         normal Site 5         normal         normal Site 6         normal         normal Site 7  normal         normal Site 8         normal         normal Site 9         normal         normal Site 10         normal         normal  Impression      normal         normal

## 2010-10-18 NOTE — Letter (Signed)
Summary: [employer]: Notice Of Approval  [employer]: Notice Of Approval   Imported By: Florinda Marker 10/25/2009 16:25:58  _____________________________________________________________________  External Attachment:    Type:   Image     Comment:   External Document

## 2010-10-18 NOTE — Letter (Signed)
Summary: [employer]  [employer]   Imported By: Margie Billet 06/08/2010 16:52:41  _____________________________________________________________________  External Attachment:    Type:   Image     Comment:   External Document

## 2010-10-18 NOTE — Miscellaneous (Signed)
Summary: [facility]: Orders  [facility]: Orders   Imported By: Florinda Marker 11/08/2009 15:31:07  _____________________________________________________________________  External Attachment:    Type:   Image     Comment:   External Document

## 2010-10-18 NOTE — Progress Notes (Signed)
Summary: phone/gg     Phone Note From Other Clinic   Caller: Nurse Summary of Call: Call from [facility] from  [facility] .  She is seeing pt for weekly visit and reports  pt's lungs  has  rhonchi in upper and lower lobes, productive cough ( white) .  nurse just noticed change today.  She was seen last week and lungs were clear.  Heart rate 104, unable to get temp. THe pt states she feels good except for cough.  Eating and drinking okay. Next appointment 4/11    Nurse feels pt should be seen today or tomorrow as she is afraid cough may turn into pneumonia.    please advsse Pt # [phone]  Initial call taken by: Merrie Roof RN,  December 15, 2009 4:06 PM  Follow-up for Phone Call        Reviewed chart.  Recent hospital stay for Diarrhea.  Didn't keep Hosptial F/U appt on 3/14 (she needed a BMP).  Do we have any openings tomorrow for her to be seen?  If so,please schedule appt.  Otherwise let me know so we can come up with another plan.  THanks Follow-up by: Blanch Media MD,  December 15, 2009 4:50 PM  Additional Follow-up for Phone Call Additional follow up Details #1::        Talked with pt and gave appointment for Friday 4/1 at 1030.  Pt informed if she gets worse, SOB or fever before appointment she should go to ED for eval. Patient/caller verbalizes understanding of these instructions.  Additional Follow-up by: Merrie Roof RN,  December 15, 2009 5:01 PM

## 2010-10-18 NOTE — Letter (Signed)
Summary: [employer]  [employer]   Imported By: Margie Billet 01/25/2010 15:16:51  _____________________________________________________________________  External Attachment:    Type:   Image     Comment:   External Document

## 2010-11-03 HISTORY — PX: ANGIOPLASTY / STENTING FEMORAL: SUR30

## 2010-12-02 LAB — GLUCOSE, CAPILLARY: Glucose-Capillary: 173 mg/dL — ABNORMAL HIGH (ref 70–99)

## 2010-12-03 LAB — CARDIAC PANEL(CRET KIN+CKTOT+MB+TROPI)
CK, MB: 0.7 ng/mL (ref 0.3–4.0)
Relative Index: INVALID (ref 0.0–2.5)
Total CK: 75 U/L (ref 7–177)
Troponin I: 0.01 ng/mL (ref 0.00–0.06)

## 2010-12-03 LAB — GLUCOSE, CAPILLARY
Glucose-Capillary: 109 mg/dL — ABNORMAL HIGH (ref 70–99)
Glucose-Capillary: 152 mg/dL — ABNORMAL HIGH (ref 70–99)
Glucose-Capillary: 166 mg/dL — ABNORMAL HIGH (ref 70–99)
Glucose-Capillary: 215 mg/dL — ABNORMAL HIGH (ref 70–99)

## 2010-12-03 LAB — CBC
HCT: 35.9 % — ABNORMAL LOW (ref 36.0–46.0)
HCT: 36.3 % (ref 36.0–46.0)
Hemoglobin: 11.9 g/dL — ABNORMAL LOW (ref 12.0–15.0)
MCH: 32.9 pg (ref 26.0–34.0)
MCV: 99.1 fL (ref 78.0–100.0)
Platelets: 236 10*3/uL (ref 150–400)
Platelets: 278 10*3/uL (ref 150–400)
RBC: 3.62 MIL/uL — ABNORMAL LOW (ref 3.87–5.11)
RBC: 3.79 MIL/uL — ABNORMAL LOW (ref 3.87–5.11)
RDW: 13.5 % (ref 11.5–15.5)
RDW: 13.8 % (ref 11.5–15.5)
WBC: 10.7 10*3/uL — ABNORMAL HIGH (ref 4.0–10.5)
WBC: 10.8 10*3/uL — ABNORMAL HIGH (ref 4.0–10.5)

## 2010-12-03 LAB — BASIC METABOLIC PANEL
BUN: 13 mg/dL (ref 6–23)
CO2: 25 mEq/L (ref 19–32)
CO2: 26 mEq/L (ref 19–32)
Calcium: 9.3 mg/dL (ref 8.4–10.5)
Calcium: 9.6 mg/dL (ref 8.4–10.5)
Creatinine, Ser: 0.74 mg/dL (ref 0.4–1.2)
Creatinine, Ser: 0.79 mg/dL (ref 0.4–1.2)
GFR calc Af Amer: >60 mL/min (ref >60)
GFR calc non Af Amer: >60 mL/min (ref >60)
GFR calc non Af Amer: >60 mL/min (ref >60)
Potassium: 3.3 mEq/L — ABNORMAL LOW (ref 3.5–5.1)
Potassium: 3.6 mEq/L (ref 3.5–5.1)
Sodium: 136 mEq/L (ref 135–145)

## 2010-12-03 LAB — COMPREHENSIVE METABOLIC PANEL
AST: 26 U/L (ref 0–37)
Albumin: 4 g/dL (ref 3.5–5.2)
Alkaline Phosphatase: 90 U/L (ref 39–117)
Chloride: 106 mEq/L (ref 96–112)
GFR calc Af Amer: >60 mL/min (ref >60)
Potassium: 3.8 mEq/L (ref 3.5–5.1)
Sodium: 138 mEq/L (ref 135–145)
Total Bilirubin: 1.1 mg/dL (ref 0.3–1.2)

## 2010-12-03 LAB — POCT CARDIAC MARKERS
CKMB, poc: <1 ng/mL — ABNORMAL LOW (ref 1.0–8.0)
Myoglobin, poc: 48.5 ng/mL (ref 12–200)

## 2010-12-03 LAB — DIFFERENTIAL
Basophils Absolute: 0 10*3/uL (ref 0.0–0.1)
Lymphocytes Relative: 21 % (ref 12–46)
Monocytes Absolute: 0.7 10*3/uL (ref 0.1–1.0)
Monocytes Relative: 6 % (ref 3–12)
Neutro Abs: 7.8 10*3/uL — ABNORMAL HIGH (ref 1.7–7.7)

## 2010-12-03 LAB — CK TOTAL AND CKMB (NOT AT ARMC): Relative Index: INVALID (ref 0.0–2.5)

## 2010-12-04 LAB — POCT I-STAT, CHEM 8
Calcium, Ion: 1.23 mmol/L (ref 1.12–1.32)
Creatinine, Ser: 0.9 mg/dL (ref 0.4–1.2)
Glucose, Bld: 137 mg/dL — ABNORMAL HIGH (ref 70–99)
HCT: 40 % (ref 36.0–46.0)
Hemoglobin: 13.6 g/dL (ref 12.0–15.0)

## 2010-12-04 LAB — CBC
MCH: 33.4 pg (ref 26.0–34.0)
MCHC: 34.1 g/dL (ref 30.0–36.0)
MCV: 97.9 fL (ref 78.0–100.0)
Platelets: 295 10*3/uL (ref 150–400)
RDW: 13.8 % (ref 11.5–15.5)

## 2010-12-04 LAB — DIFFERENTIAL
Basophils Absolute: 0.1 10*3/uL (ref 0.0–0.1)
Eosinophils Absolute: 0 10*3/uL (ref 0.0–0.7)
Eosinophils Relative: 0 % (ref 0–5)

## 2010-12-04 LAB — POCT CARDIAC MARKERS: Troponin i, poc: <0.05 ng/mL (ref 0.00–0.09)

## 2010-12-04 LAB — PROTIME-INR: Prothrombin Time: 13.8 seconds (ref 11.6–15.2)

## 2010-12-07 LAB — GLUCOSE, CAPILLARY: Glucose-Capillary: 146 mg/dL — ABNORMAL HIGH (ref 70–99)

## 2010-12-09 LAB — CULTURE, BLOOD (ROUTINE X 2)
Culture: NO GROWTH
Culture: NO GROWTH

## 2010-12-09 LAB — CBC
HCT: 34.5 % — ABNORMAL LOW (ref 36.0–46.0)
Hemoglobin: 11.7 g/dL — ABNORMAL LOW (ref 12.0–15.0)
MCV: 98 fL (ref 78.0–100.0)
Platelets: 325 10*3/uL (ref 150–400)
WBC: 12.3 10*3/uL — ABNORMAL HIGH (ref 4.0–10.5)

## 2010-12-09 LAB — URINALYSIS, MICROSCOPIC ONLY
Hgb urine dipstick: NEGATIVE
Protein, ur: NEGATIVE mg/dL
Urobilinogen, UA: 0.2 mg/dL (ref 0.0–1.0)

## 2010-12-09 LAB — DIFFERENTIAL
Basophils Absolute: 0 10*3/uL (ref 0.0–0.1)
Basophils Relative: 0 % (ref 0–1)
Eosinophils Relative: 0 % (ref 0–5)
Monocytes Absolute: 0.4 10*3/uL (ref 0.1–1.0)

## 2010-12-09 LAB — COMPREHENSIVE METABOLIC PANEL
Albumin: 4.1 g/dL (ref 3.5–5.2)
Alkaline Phosphatase: 110 U/L (ref 39–117)
BUN: 17 mg/dL (ref 6–23)
Chloride: 103 mEq/L (ref 96–112)
Creatinine, Ser: 1.02 mg/dL (ref 0.4–1.2)
GFR calc non Af Amer: 55 mL/min — ABNORMAL LOW (ref >60)
Glucose, Bld: 279 mg/dL — ABNORMAL HIGH (ref 70–99)
Potassium: 3.3 mEq/L — ABNORMAL LOW (ref 3.5–5.1)
Total Bilirubin: 0.7 mg/dL (ref 0.3–1.2)

## 2010-12-09 LAB — RAPID URINE DRUG SCREEN, HOSP PERFORMED
Barbiturates: NOT DETECTED
Benzodiazepines: NOT DETECTED
Cocaine: NOT DETECTED

## 2010-12-09 LAB — GLUCOSE, CAPILLARY
Glucose-Capillary: 174 mg/dL — ABNORMAL HIGH (ref 70–99)
Glucose-Capillary: 213 mg/dL — ABNORMAL HIGH (ref 70–99)
Glucose-Capillary: 308 mg/dL — ABNORMAL HIGH (ref 70–99)

## 2010-12-09 LAB — HIV ANTIBODY (ROUTINE TESTING W REFLEX): HIV: NONREACTIVE

## 2010-12-09 LAB — BASIC METABOLIC PANEL
BUN: 10 mg/dL (ref 6–23)
CO2: 26 mEq/L (ref 19–32)
Chloride: 105 mEq/L (ref 96–112)
Creatinine, Ser: 0.84 mg/dL (ref 0.4–1.2)
Glucose, Bld: 224 mg/dL — ABNORMAL HIGH (ref 70–99)

## 2010-12-09 LAB — TSH: TSH: 0.539 u[IU]/mL (ref 0.350–4.500)

## 2010-12-19 LAB — MAGNESIUM: Magnesium: 2 mg/dL (ref 1.5–2.5)

## 2010-12-19 LAB — LIPID PANEL
HDL: 35 mg/dL — ABNORMAL LOW (ref >39)
Total CHOL/HDL Ratio: 6.8 RATIO
Triglycerides: 149 mg/dL (ref <150)
VLDL: 30 mg/dL (ref 0–40)

## 2010-12-19 LAB — DIFFERENTIAL
Basophils Absolute: 0 10*3/uL (ref 0.0–0.1)
Basophils Relative: 0 % (ref 0–1)
Eosinophils Relative: 1 % (ref 0–5)
Lymphocytes Relative: 22 % (ref 12–46)
Neutro Abs: 6.9 10*3/uL (ref 1.7–7.7)

## 2010-12-19 LAB — FOLATE RBC: RBC Folate: 911 ng/mL — ABNORMAL HIGH (ref 180–600)

## 2010-12-19 LAB — COMPREHENSIVE METABOLIC PANEL
ALT: 26 U/L (ref 0–35)
AST: 22 U/L (ref 0–37)
Albumin: 3.9 g/dL (ref 3.5–5.2)
Alkaline Phosphatase: 100 U/L (ref 39–117)
BUN: 10 mg/dL (ref 6–23)
CO2: 25 mEq/L (ref 19–32)
CO2: 29 mEq/L (ref 19–32)
Chloride: 104 mEq/L (ref 96–112)
Chloride: 104 mEq/L (ref 96–112)
Creatinine, Ser: 0.85 mg/dL (ref 0.4–1.2)
Creatinine, Ser: 0.86 mg/dL (ref 0.4–1.2)
GFR calc Af Amer: >60 mL/min (ref >60)
GFR calc non Af Amer: >60 mL/min (ref >60)
Potassium: 3.9 mEq/L (ref 3.5–5.1)
Sodium: 140 mEq/L (ref 135–145)
Total Bilirubin: 0.4 mg/dL (ref 0.3–1.2)
Total Bilirubin: 0.6 mg/dL (ref 0.3–1.2)

## 2010-12-19 LAB — CBC
HCT: 36.7 % (ref 36.0–46.0)
HCT: 38.5 % (ref 36.0–46.0)
Hemoglobin: 12.5 g/dL (ref 12.0–15.0)
MCHC: 34 g/dL (ref 30.0–36.0)
MCHC: 34.1 g/dL (ref 30.0–36.0)
MCV: 100.2 fL — ABNORMAL HIGH (ref 78.0–100.0)
MCV: 99 fL (ref 78.0–100.0)
MCV: 99.1 fL (ref 78.0–100.0)
Platelets: 239 10*3/uL (ref 150–400)
Platelets: 248 10*3/uL (ref 150–400)
Platelets: 279 10*3/uL (ref 150–400)
RBC: 3.7 MIL/uL — ABNORMAL LOW (ref 3.87–5.11)
RBC: 4.01 MIL/uL (ref 3.87–5.11)
RDW: 12.8 % (ref 11.5–15.5)
RDW: 12.9 % (ref 11.5–15.5)
WBC: 10.4 10*3/uL (ref 4.0–10.5)
WBC: 9.8 10*3/uL (ref 4.0–10.5)

## 2010-12-19 LAB — URINE MICROSCOPIC-ADD ON

## 2010-12-19 LAB — URINALYSIS, ROUTINE W REFLEX MICROSCOPIC
Bilirubin Urine: NEGATIVE
Hgb urine dipstick: NEGATIVE
Ketones, ur: NEGATIVE mg/dL
Nitrite: NEGATIVE
Specific Gravity, Urine: 1.022 (ref 1.005–1.030)
pH: 6 (ref 5.0–8.0)

## 2010-12-19 LAB — EXPECTORATED SPUTUM ASSESSMENT W GRAM STAIN, RFLX TO RESP C

## 2010-12-19 LAB — URINE CULTURE

## 2010-12-19 LAB — GLUCOSE, CAPILLARY: Glucose-Capillary: 253 mg/dL — ABNORMAL HIGH (ref 70–99)

## 2010-12-19 LAB — TROPONIN I: Troponin I: 0.05 ng/mL (ref 0.00–0.06)

## 2010-12-19 LAB — RAPID URINE DRUG SCREEN, HOSP PERFORMED
Amphetamines: NOT DETECTED
Barbiturates: NOT DETECTED
Benzodiazepines: NOT DETECTED
Tetrahydrocannabinol: POSITIVE — AB

## 2010-12-19 LAB — BASIC METABOLIC PANEL
CO2: 29 mEq/L (ref 19–32)
GFR calc non Af Amer: 53 mL/min — ABNORMAL LOW (ref >60)
Glucose, Bld: 173 mg/dL — ABNORMAL HIGH (ref 70–99)
Potassium: 3.9 mEq/L (ref 3.5–5.1)
Sodium: 141 mEq/L (ref 135–145)

## 2010-12-19 LAB — CULTURE, RESPIRATORY W GRAM STAIN: Culture: NORMAL

## 2010-12-19 LAB — TSH: TSH: 0.992 u[IU]/mL (ref 0.350–4.500)

## 2010-12-19 LAB — HEMOGLOBIN A1C
Hgb A1c MFr Bld: 7.2 % — ABNORMAL HIGH (ref 4.6–6.1)
Mean Plasma Glucose: 160 mg/dL

## 2010-12-19 LAB — PROTIME-INR: Prothrombin Time: 14.8 seconds (ref 11.6–15.2)

## 2010-12-19 LAB — VITAMIN B12: Vitamin B-12: 852 pg/mL (ref 211–911)

## 2010-12-23 LAB — COMPREHENSIVE METABOLIC PANEL
ALT: 13 U/L (ref 0–35)
AST: 16 U/L (ref 0–37)
AST: 18 U/L (ref 0–37)
Albumin: 3.9 g/dL (ref 3.5–5.2)
Alkaline Phosphatase: 101 U/L (ref 39–117)
BUN: 37 mg/dL — ABNORMAL HIGH (ref 6–23)
CO2: 21 mEq/L (ref 19–32)
Calcium: 8.4 mg/dL (ref 8.4–10.5)
Chloride: 107 mEq/L (ref 96–112)
Creatinine, Ser: 2.83 mg/dL — ABNORMAL HIGH (ref 0.4–1.2)
GFR calc Af Amer: 21 mL/min — ABNORMAL LOW (ref >60)
GFR calc Af Amer: 22 mL/min — ABNORMAL LOW (ref >60)
GFR calc non Af Amer: 17 mL/min — ABNORMAL LOW (ref >60)
Glucose, Bld: 231 mg/dL — ABNORMAL HIGH (ref 70–99)
Potassium: 5.4 mEq/L — ABNORMAL HIGH (ref 3.5–5.1)
Sodium: 138 mEq/L (ref 135–145)
Total Bilirubin: 0.4 mg/dL (ref 0.3–1.2)
Total Protein: 7.4 g/dL (ref 6.0–8.3)

## 2010-12-23 LAB — CULTURE, BLOOD (ROUTINE X 2)

## 2010-12-23 LAB — GLUCOSE, CAPILLARY
Glucose-Capillary: 126 mg/dL — ABNORMAL HIGH (ref 70–99)
Glucose-Capillary: 129 mg/dL — ABNORMAL HIGH (ref 70–99)
Glucose-Capillary: 131 mg/dL — ABNORMAL HIGH (ref 70–99)
Glucose-Capillary: 143 mg/dL — ABNORMAL HIGH (ref 70–99)
Glucose-Capillary: 144 mg/dL — ABNORMAL HIGH (ref 70–99)
Glucose-Capillary: 175 mg/dL — ABNORMAL HIGH (ref 70–99)

## 2010-12-23 LAB — CBC
HCT: 34.9 % — ABNORMAL LOW (ref 36.0–46.0)
HCT: 40.8 % (ref 36.0–46.0)
Hemoglobin: 16.6 g/dL — ABNORMAL HIGH (ref 12.0–15.0)
MCHC: 33.3 g/dL (ref 30.0–36.0)
MCHC: 33.4 g/dL (ref 30.0–36.0)
MCHC: 33.4 g/dL (ref 30.0–36.0)
MCV: 99.2 fL (ref 78.0–100.0)
MCV: 99.3 fL (ref 78.0–100.0)
MCV: 99.4 fL (ref 78.0–100.0)
Platelets: 248 10*3/uL (ref 150–400)
Platelets: 256 10*3/uL (ref 150–400)
Platelets: 286 10*3/uL (ref 150–400)
RBC: 4.1 MIL/uL (ref 3.87–5.11)
RBC: 4.99 MIL/uL (ref 3.87–5.11)
RDW: 13 % (ref 11.5–15.5)
RDW: 13.3 % (ref 11.5–15.5)
WBC: 17.6 10*3/uL — ABNORMAL HIGH (ref 4.0–10.5)
WBC: 8.2 10*3/uL (ref 4.0–10.5)
WBC: 9.4 10*3/uL (ref 4.0–10.5)

## 2010-12-23 LAB — IRON AND TIBC
Iron: 60 ug/dL (ref 42–135)
Saturation Ratios: 24 % (ref 20–55)
UIBC: 189 ug/dL

## 2010-12-23 LAB — DIFFERENTIAL
Basophils Relative: 0 % (ref 0–1)
Eosinophils Absolute: 0 10*3/uL (ref 0.0–0.7)
Eosinophils Relative: 0 % (ref 0–5)
Lymphs Abs: 1.7 10*3/uL (ref 0.7–4.0)
Monocytes Absolute: 0.5 10*3/uL (ref 0.1–1.0)
Monocytes Relative: 3 % (ref 3–12)
Neutrophils Relative %: 87 % — ABNORMAL HIGH (ref 43–77)

## 2010-12-23 LAB — POCT CARDIAC MARKERS: Myoglobin, poc: 197 ng/mL (ref 12–200)

## 2010-12-23 LAB — APTT: aPTT: 20 seconds — ABNORMAL LOW (ref 24–37)

## 2010-12-23 LAB — VITAMIN B12: Vitamin B-12: 404 pg/mL (ref 211–911)

## 2010-12-23 LAB — URINALYSIS, ROUTINE W REFLEX MICROSCOPIC
Glucose, UA: NEGATIVE mg/dL
Ketones, ur: NEGATIVE mg/dL
Specific Gravity, Urine: 1.03 (ref 1.005–1.030)
pH: 5 (ref 5.0–8.0)

## 2010-12-23 LAB — BASIC METABOLIC PANEL
BUN: 19 mg/dL (ref 6–23)
BUN: 9 mg/dL (ref 6–23)
CO2: 26 mEq/L (ref 19–32)
Chloride: 108 mEq/L (ref 96–112)
Chloride: 109 mEq/L (ref 96–112)
Creatinine, Ser: 0.91 mg/dL (ref 0.4–1.2)
Creatinine, Ser: 1.29 mg/dL — ABNORMAL HIGH (ref 0.4–1.2)
GFR calc non Af Amer: >60 mL/min (ref >60)
Glucose, Bld: 127 mg/dL — ABNORMAL HIGH (ref 70–99)
Glucose, Bld: 129 mg/dL — ABNORMAL HIGH (ref 70–99)

## 2010-12-23 LAB — LIPID PANEL
LDL Cholesterol: 96 mg/dL (ref 0–99)
Total CHOL/HDL Ratio: 6.1 RATIO

## 2010-12-23 LAB — URINE MICROSCOPIC-ADD ON

## 2010-12-23 LAB — CK TOTAL AND CKMB (NOT AT ARMC): Total CK: 36 U/L (ref 7–177)

## 2010-12-23 LAB — HEMOGLOBIN A1C: Mean Plasma Glucose: 180 mg/dL

## 2010-12-23 LAB — MAGNESIUM: Magnesium: 1.8 mg/dL (ref 1.5–2.5)

## 2010-12-23 LAB — CLOSTRIDIUM DIFFICILE EIA

## 2010-12-23 LAB — PHOSPHORUS: Phosphorus: 5.4 mg/dL — ABNORMAL HIGH (ref 2.3–4.6)

## 2010-12-24 LAB — GLUCOSE, CAPILLARY: Glucose-Capillary: 233 mg/dL — ABNORMAL HIGH (ref 70–99)

## 2011-01-31 NOTE — Assessment & Plan Note (Signed)
OFFICE VISIT   Vanessa Calderon, Vanessa Calderon  DOB:  05/31/1946                                       07/05/2007  CHART#:[id number]   Patient presents today for evaluation of lower extremity arterial  insufficiency.  She is a 65 year old black female with an extensive  history of cerebrovascular occlusive disease.  She is an insulin-  dependent diabetic and is status post coronary artery bypass grafting in  2006 and had left carotid endarterectomy at the same procedure by Dr.  Cari Caraway.  She has done well from a cardiac standpoint since that  time.   FAMILY HISTORY:  Negative for premature atherosclerotic disease.   SOCIAL HISTORY:  She is an Research scientist (medical) at [employer].  She has two children.  She does not smoke or drink alcohol.   She has multiple positive review of systems with shortness of breath  with exertion, constipation, headaches, joint pain, or muscle pain.  She  does report that she has bilateral calf claudication symptoms with  walking, and this does prevent her from doing her usual work.  She has  also sciatica bilaterally.   PHYSICAL EXAMINATION:  She is a well-developed, mildly obese black  female appearing her stated age of 72.  Pressure is 155/80, pulse 96,  respirations 18.  Her O2 saturations are 94%.  She has a well-healed  left carotid incision.  She has no carotid bruits bilaterally.  She is  grossly intact neurologically.  She has 2+ radial pulses.  She has 1-2+  femoral pulses and absent popliteal and distal pulses bilaterally.  She  has no tissue loss in her lower extremities.  She does have well-healed  incisions from her prior right leg vein harvest.   She had undergone outpatient noninvasive vascular laboratory studies at  [facility] on 09/29, and I have reviewed these with her.  This  does reveal ankle brachial index of 0.44 on the left and 0.53 on the  right.  This suggest multilevel disease.  Since  this is truly limiting  to her, I have recommended that she proceed with outpatient arteriogram  for further evaluation and angioplasty and stenting if indicated.  She  understands this is not limb-threatening.  We will proceed with this  with Dr. Cari Caraway on 11/03 at [facility].   Larina Earthly, M.D.  Electronically Signed   TFE/MEDQ  D:  07/05/2007  T:  07/08/2007  Job:  [id number]   cc:   Ralene Ok, M.D.  Di Kindle. Edilia Bo, M.D.

## 2011-01-31 NOTE — Op Note (Signed)
NAMEEMELEE, RODOCKER              ACCOUNT NO.:  [id number]   MEDICAL RECORD NO.:  [id number]          PATIENT TYPE:  OIB   LOCATION:  5504                         FACILITY:  [facility]   PHYSICIAN:  Di Kindle. Edilia Bo, M.D.DATE OF BIRTH:  12-30-1945   DATE OF PROCEDURE:  07/22/2007  DATE OF DISCHARGE:                               OPERATIVE REPORT   PREOPERATIVE DIAGNOSIS:  Multilevel arterial occlusive disease   POSTOPERATIVE DIAGNOSIS:  Multilevel arterial occlusive disease   PROCEDURES:  1. Aortogram.  2. Bilateral iliac arteriogram.  3. Bilateral lower extremity runoff.   SURGEON:  Di Kindle. Edilia Bo, M.D.   ANESTHESIA:  Local with sedation.   TECHNIQUE:  The patient was taken to the [facility] lab at [facility]. [facility] and sedated with 1 mg of Versed and 15 mcg of  fentanyl.  She later received 10 mg of labetalol for hypertension and  also an additional milligram of Versed and an additional 50 mcg of  fentanyl for sedation.  The groins were prepped and draped in the usual  sterile fashion.  After the skin was infiltrated with 1% lidocaine, I  made multiple attempts to cannulate the right common femoral artery and  was unsuccessful.  I therefore elected to go to the left side where she  did have a pulse.  After the skin was anesthetize, the left common  femoral artery was cannulated and a guidewire introduced into the  infrarenal aorta under fluoroscopic control.  A 5-French sheath was  introduced over the wire.  Pigtail catheter was positioned at the L1  vertebral body and flush aortogram obtained.  The catheter was then  repositioned above the aortic bifurcation and oblique iliac projections  were obtained.  Bilateral lower extremity runoff films were obtained.   Of note, there was a moderate common iliac artery stenosis on the right  which I elected to try to address with angioplasty.  Therefore, I went  back to the right groin and again tried multiple times  to cannulate the  common femoral artery.  At one point, I did enter the artery but was  unable to thread the wire.  I persisted using the handheld Doppler, the  SmartSite Doppler, and visualized also with the Duplex scanner.  Despite  multiple attempts, I was unsuccessful and ultimately elected to stop and  not persist.   FINDINGS:  There are accessory renal arteries bilaterally.  No  significant renal artery stenosis is identified.  The infrarenal aorta  is widely patent with no significant stenosis.   On the right side, there is a proximal 50-60% right common iliac artery  stenosis just below the origin of the common iliac artery.  The  hypogastric artery and external iliac artery on the right are patent.  The right common femoral, superficial femoral and deep femoral arteries  are patent.  There is a 70% stenosis in the mid thigh and the  superficial femoral artery.  The popliteal artery is patent and there is  essentially two-vessel runoff via the anterior tibial and peroneal  arteries.  There is a stenosis in the proximal  posterior tibial artery  on the right.  The peroneal artery is occluded.   On the left side, the common iliac, external iliac and hypogastric  arteries are patent.  The common femoral and deep femoral artery are  patent.  There is moderate diffuse disease throughout superficial  femoral artery including a short segment occlusion in the midthigh at  the adductor canal.  The above-knee popliteal artery is patent.  There  is a high takeoff of the anterior tibial artery above the knee and there  is three-vessel runoff on the left via the anterior tibial, posterior  tibial and peroneal arteries.   CONCLUSIONS:  1. 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  2. Superficial femoral artery occlusive disease bilaterally.  3. Tibial occlusive disease as described above.      Di Kindle. Edilia Bo, M.D.  Electronically Signed     CSD/MEDQ  D:  07/22/2007  T:   07/22/2007  Job:  [id number]

## 2011-01-31 NOTE — Op Note (Signed)
Vanessa Calderon, Vanessa Calderon              ACCOUNT NO.:  [id number]   MEDICAL RECORD NO.:  [id number]          PATIENT TYPE:  INP   LOCATION:  5039                         FACILITY:  [facility]   PHYSICIAN:  Alvy Beal, MD    DATE OF BIRTH:  1946-08-03   DATE OF PROCEDURE:  DATE OF DISCHARGE:                               OPERATIVE REPORT   PREOPERATIVE DIAGNOSES:  Degenerative lumbar spinal stenosis at L4-L5  with a degenerative slip at L4-L5.   POSTOPERATIVE DIAGNOSES:  Degenerative lumbar spinal stenosis at L4-L5  with a degenerative slip at L4-L5.   OPERATIVE PROCEDURE:  Transforaminal lumbar interbody fusion (TLIF) L4-  L5.   INSTRUMENTATION USED:  A Stryker radius pedicle screw system with left  L4-L5 pedicle screws 40 mm in length, 6.25 diameter with a 50-mm rod  with an 11 long banana-shaped interbody PEEK, TLIF cage packed with  local bone mixed with Actifuse.   COMPLICATIONS:  Medial pedicle wall blowout of the L5 pedicle on the  right necessitating a unilateral pedicle screw construct on the right.   HISTORY:  This is a very pleasant 66 year old woman who has been having  horrific back, buttock, and right leg pain for sometime now.  Attempts  at conservative management consisting of physiotherapy, injection  therapy, narcotics, and time have failed to alleviate her pain.  After  discussing treatment options, she elected to proceed with surgery.  All  appropriate risks, benefits, and alternatives were discussed and consent  was obtained.   OPERATIVE NOTE:  The patient was brought to the operating room and  placed supine on the operating table.  After successful induction of  general anesthesia and endotracheal intubation, TEDs, SCD, and a Foley  were applied.  The spine Jean Rosenthal frame was brought in, secured over the  top of the patient.  She was turned into the prone position.  All bony  prominences were well padded and the lumbar spine was prepped and draped  in a  standard fashion.   Fluoro view in the lateral was used to estimate the appropriate incision  and then a generous posterior midline incision was made.  Sharp  dissection was carried out through the adipose tissue down to the deep  fascia.  Deep fascia was sharply incised to expose the L3, L4, L5, and a  portion of the S1 spinous processes.  I stripped the paraspinal muscles  using a Cobb elevator to completely expose the L4 and L5 spinous  processes and the L4-L5 and the L3-L4 facet complex and the L4 and L5  transverse processes.  This was initially done only on the right-hand  side as this was the site for the TLIF, and I needed a pedicle screw  fixation.  With the approach complete, I used x-ray to confirm the  appropriate level and then proceeded with the pedicle screw fixation.   Using an awl, I identified the appropriate starting position from the L4  pedicle.  This was at the midportion of the transverse process just at  the junction of the transverse process and facet complex.  I advanced  the  pedicle probe through the pedicle and into the vertebral body.  I  confirmed trajectory in position in the AP and lateral planes.  I then  removed the pedicle probe, palpated with a ball-tip feeler, tapped, and  then repalpated with a ball-tip feeler.  I had satisfactory placement,  satisfactory bony canal, and then I placed a screw.  I repeated this  procedure at the right L5 pedicle.  During the tapping, I felt as though  the lateral wall had blown out and when I repalpated with the ball-tip  feeler, it had indeed blown out at the lateral side.  Because of the  lateral blowout of the pedicle, I attempted to reposition and start more  medial.  During the insertion process, there was a slight medialization.  There was a medial blowout of the pedicle.  At this point having tried  and failed to place the L5 pedicle screw on the right-hand side at L5, I  elected to remove the left L4 pedicle  and then position them on the left-  hand side.  Using the same technique that I did at the L4, I was able to  probe, palpate, tap, palpate again, and place a screw successfully at  this time on the left side at L4 and L5.  With both pedicle screws  properly positioned, I proceeded with the decompression.   Using a double-action Leksell rongeur, I removed the entire spinous  process of L5 and the majority of that of L4.  I then performed a  complete laminectomy using a 3-mm Kerrison of L5 in the central region.  There was significant thickening and buckling of the ligamentum flavum  with marked spinal stenosis.  At this point in time, looking at the  contralateral side, I developed a plane underneath the thickened  ligamentum flavum and the dura.  I placed a patty to protect the  underlying thecal sac and then resected the thickened ligamentum flavum  in the lateral recess.  On the left-hand side, I went all the way down  into the lateral recess, and I was able to visualize the medial wall of  the L4 and L5 pedicle.  There was no breach noted.  I also palpated out  the exiting L5 neural foramen on the left-hand side and I could not  palpate any metal to suggest that the pedicle screw was improperly  placed.  With the left lateral recess decompression and foraminotomy  complete, I then proceeded over to the contralateral side.  It should be  noted that during this decompression, again there was a significant  thickened ligamentum flavum and there was a very significant synovial  cyst/disk herniation.  This was a free fragment that was right in the  axilla of the lateral recess.  I was able to resect it.  I then  identified the traversing L5 nerve root and began to perform a generous  foraminotomy of the L5 neural foramen.  I then identified the medial  wall of the pedicle and in fact, I could see where the medial breach of  the pedicle wall had occurred.  I decompressed all of the free  fragments  of bone.  I then removed en bloc the L4 inferior facet on that right-  hand side and then performed a complete resection of the pars of L4 on  that right-hand side.  At this point, I could visualize the L4 nerve  root.  I protected it and identified the medial wall of  the L4 pedicle.  With this pedicle identified, I knew I had an adequate L4 foraminotomy  and lateral recess decompression.  I also could completely visualize the  L5 nerve root and I had an adequate decompression.  I then swept the  thecal sac medially, protected it with a D'Errico, incised the annulus,  and then began performing the diskectomy.   Using a combination of various curettes, Kerrison rongeurs, pituitary  rongeurs, I was able to resect the entire disk material.  I rasped the  endplates and had excellent bleeding bone.  I then trialed sequentially  and was able to get a size 11 interbody PEEK spacer.  Along the anterior  annulus, I packed Actifuse and positioned it in the anterior one third  of the disk space.  I then placed the banana-shaped 30-mm long 11  Stryker PEEK interbody TLIF cage which was packed with local bone graft.  At this point, I had an adequate positioning of the graft.  I then  compressed the system, locked the rod in on the left side, and torqued  it to the appropriate final tension.  I irrigated it copiously with  normal saline.  Once the TLIF was completed with the instrumentation, I  then again swept the lateral recess on that left side out the L5 neural  foramen and along the L4 nerve root.  There was still adequate  decompression with no adverse pressure left on the nerve root.  At this  point, I irrigated copiously with normal saline, used a bipolar  electrocautery to obtain hemostasis, and then maintained it with  FloSeal.  I placed a deep drain, closed the deep fascia with interrupted  #1 Vicryl sutures, superficial with 2-0 Vicryl sutures, and a 4-0  Monocryl for the skin.   Steri-Strips and dry dressing were applied.  The  patient was extubated and transferred to the PACU without incident.  At  the end of the case, all needle and sponge counts were correct.      Alvy Beal, MD  Electronically Signed     DDB/MEDQ  D:  07/16/2008  T:  07/17/2008  Job:  [id number]

## 2011-01-31 NOTE — Discharge Summary (Signed)
NAMEFREDI, HURTADO              ACCOUNT NO.:  [id number]   MEDICAL RECORD NO.:  [id number]          PATIENT TYPE:  INP   LOCATION:  5039                         FACILITY:  [facility]   PHYSICIAN:  Alvy Beal, MD    DATE OF BIRTH:  November 07, 1945   DATE OF ADMISSION:  07/16/2008  DATE OF DISCHARGE:  07/20/2008                               DISCHARGE SUMMARY   ADMISSION DIAGNOSIS:  Lumbar degenerative disk disease with a lumbar  degenerative slip at the L4-5 level.   DISCHARGE DIAGNOSIS:  Lumbar degenerative disk disease with a lumbar  degenerative slip at the L4-5 level.   PROCEDURE:  TLIF at the L4-5 level.   BRIEF HISTORY:  Ms. Mitzie Na is a very pleasant 65 year old female who  had an horrific buttock, back, and right leg pain for some time now.  Attempts at conservative management consisting of physiotherapy,  injection therapy, narcotics, and time have failed to alleviate her  pain.  After discussing all treatment options, the patient elected to  proceed with surgery.  All appropriate risks, benefits, and alternatives  were discussed, and consent was obtained.   HOSPITAL COURSE:  The patient's hospital course was approximately 4 days  in length.  The patient tolerated her TLIF procedure very well and was  transferred from the operating room to the PACU and subsequently to the  orthopedics floor without incident.  Postoperatively day #1, the patient  was working well with Physical Therapy.  She had some mild complaints of  pain.  Neurovascularly, she remained intact.  She had some mild serous  drainage present from her dressing, and the patient remained afebrile.  Postoperatively day #2, the patient was continuing to progress with  Physical Therapy.  She was tolerating a regular diet and was having  regular bowel and bladder movements.  The patient's pain continued to  improve, and postoperatively day #4 the patient was doing very well with  no complaints.  Her leg pain had  significantly improved, and the patient  was doing very well with physical therapy.  The patient is deemed stable  to be discharged home.   DISCHARGE CONDITION:  Stable.   DISCHARGE DISPOSITION:  The patient is discharged to home with a 3-in-1  rolling walker.   DISCHARGE MEDICATIONS:  The patient's discharge medications included  Percocet 10/325 one tablet p.o. q.6 h. p.r.n. pain, Robaxin 500 mg 1  tablet p.o. q.4-6 hours p.r.n. muscle spasms, and Lyrica 75 mg 1 tablet  p.o. b.i.d..  The patient is discharged on her home medications which  included fluoxetine 40 mg daily, metformin 1000 mg b.i.d., amlodipine  daily, benazepril daily, metoprolol 25 mg b.i.d., simvastatin 40 mg  daily, furosemide 40 mg daily, multivitamin daily, Lantus 35 units  daily.  The patient was instructed to discontinue the use of her aspirin  325 mg daily and to discontinue the use of her oxycodone 7.25/325.  The  patient was given a preprinted discharge instructions that went over  activity.  The patient is to ambulate as tolerated, to use her back  brace at all times when not lying in the  bed.  The patient is to avoid  any bending, stooping, squatting, or twisting at this time.  The patient  is allowed to ambulate as tolerated.  The patient is instructed that she  is allowed to shower; however, she is not allowed to take any soaking  baths or hot tub, and the patient can now resume her regular diet.   FOLLOWUP:  The patient is instructed to follow up with Dr. Shon Baton in the  office in approximately 2 weeks.  At that time, we will do a wound  check.  If at any time the patient develops fever, increased pain, or  redness around the incision site, loss of bowel or bladder or increased  pain, the patient is to call the office immediately.  The patient  understood these instructions.      Crissie Reese, PA      Alvy Beal, MD  Electronically Signed    AC/MEDQ  D:  09/22/2008  T:  09/23/2008   Job:  [id number]

## 2011-02-03 NOTE — Op Note (Signed)
NAMEARRION, BURRUEL              ACCOUNT NO.:  [id number]   MEDICAL RECORD NO.:  [id number]          PATIENT TYPE:  INP   LOCATION:  2399                         FACILITY:  [facility]   PHYSICIAN:  Di Kindle. Edilia Bo, M.D.DATE OF BIRTH:  Apr 10, 1946   DATE OF PROCEDURE:  02/14/2005  DATE OF DISCHARGE:                                 OPERATIVE REPORT   PREOPERATIVE DIAGNOSIS:  Symptomatic coronary disease with greater than 80%  left carotid stenosis.   POSTOPERATIVE DIAGNOSIS:  Symptomatic coronary disease with greater than 80%  left carotid stenosis.   PROCEDURE:  Left carotid endarterectomy in combination with coronary bypass  surgery by Dr. Tyrone Sage.   SURGEON:  Di Kindle. Edilia Bo, M.D.   ASSISTANT:  Sheliah Plane, MD and Zadie Rhine, [place]   ANESTHESIA:  General.   INDICATIONS:  This is a 65 year old woman who had presented with symptomatic  coronary artery disease. Coronary revascularization was recommended and as  part of her preop workup she had a carotid duplex scan which showed a  greater than 80% left carotid stenosis with no significant stenosis on the  right. Combined left carotid endarterectomy along with her cabbage was  recommended in order to lower her risk of perioperative and future stroke.  The procedure and potential complications including but not limited to  bleeding, stroke (periprocedural risks 1-2%), nerve injury, MI, or other  unpredictable medical problems were discussed with the patient. All of her  questions were answered and she was agreeable to proceed.   TECHNIQUE:  The patient was taken to the operating room and received a  general anesthetic. Arterial line and Swan-Ganz catheter had been placed by  anesthesia. The neck and chest and lower extremities were prepped and draped  in the usual sterile fashion for combined left carotid endarterectomy and  coronary artery bypass surgery. An incision was made along the anterior  border of the  sternocleidomastoid and the dissection carried down to the  common carotid artery which was dissected free and controlled with a Rumel  tourniquet. The facial vein was divided between 2-0 silk ties. The internal  carotid artery above the plaque was controlled with a blue vessel loop. Of  note, it was a good-sized artery. The external carotid artery was controlled  with a blue vesseloop to the superior thyroid artery was controlled with a 2-  0 silk tie. The patient was then heparinized. Clamps were then placed on the  internal and then the common and then external carotid artery. A  longitudinal arteriotomy was made in the common carotid artery and this was  extended through the plaque into the internal carotid artery. The shunt was  placed into the internal carotid artery, back bled and then placed into the  common carotid artery and secured with a Rumel tourniquet. Flow was  reestablished to the shunt. An endarterectomy plane was established and the  plaque divided proximally. Eversion endarterectomy was performed of the  external carotid artery. Distally there was a nice taper in the plaque and  no tacking sutures were required. The artery was irrigated with copious  amounts of dextran and  heparin and all loose debris removed. The Dacron  patch was then sewn using continuous 6-0 Prolene suture. Prior to completing  the patch closure, the shunt was removed. The arteries were back bled and  flushed appropriately and the  anastomosis completed and flow reestablished first to the external carotid  artery and then to the internal carotid artery. At the completion, there was  a good pulse distal to the patch and a good Doppler signal. Hemostasis was  obtained om the wound and this was then packed to be closed after coronary  revascularization.      CSD/MEDQ  D:  02/14/2005  T:  02/14/2005  Job:  [id number]

## 2011-02-03 NOTE — Op Note (Signed)
[place]  Patient:    Vanessa Calderon, Vanessa Calderon Visit Number: [id number] MRN: [id number]          Service Type: DSU Location: [facility] Attending Physician:  Twana First Dictated by:   Elana Alm Thurston Hole, M.D. Proc. Date: 11/11/01 Admit Date:  11/11/2001                             Operative Report  PREOPERATIVE DIAGNOSES: 1. 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 2. 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  POSTOPERATIVE DIAGNOSES: 1. 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 2. 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  PROCEDURE: 1. Right third and fourth trigger finger releases. 2. Right thumb interphalangeal joint and metatarsophalangeal joint cortisone    injections. 3. Left thumb interphalangeal joint and metatarsophalangeal joint cortisone    injections.  SURGEON:  Elana Alm. Thurston Hole, M.D.  ASSISTANT:  Julien Girt, P.A.  ANESTHESIA:  General.  OPERATIVE TIME:  Thirty minutes.  COMPLICATIONS:  None.  INDICATION FOR PROCEDURE:  [name] is a 65 year old woman who has had significant pain in her right hand and left thumb.  She has recurrent trigger fingers in her right third and fourth fingers and pain in her right and left thumb IP joints and MP joints and is now to undergo trigger finger releases of the third and fourth fingers and injections of the right and left thumb IP and MP joints.  DESCRIPTION OF PROCEDURE:  [name] was brought to the operating room on November 11, 2001 and placed on the operative table in supine position.  After an adequate level of general anesthesia was obtained, her right thumb IP and MP joints were sterilely injected with 1 cc of Marcaine and 1 cc of Depo-Medrol and the left thumb IP joint and MP joint were injected with the same mixture under sterile conditions.  The right hand and arm were  then prepped using sterile Betadine and draped using sterile technique.  A forearm tourniquet was then raised to 225 mm and initially through a 1-cm transverse incision based over the MP joint region of the third finger, initial exposure was made.  The underlying subcutaneous tissues were incised in line with the skin incision and the neurovascular bundles carefully protected on the radial and ulnar side of the flexor tendon sheath.  The flexor tendon sheath and A-2 pulley were then incised longitudinally revealing the underlying flexor tendon, which was found to have tendinosis in it but no definite cyst noted. The wound was irrigated and the same procedure carried out over the fourth finger in the flexor crease of the MP joint and neurovascular bundles carefully protected while the A-2 pulley was also incised longitudinally, releasing and freeing the flexor tendons to slid without any triggering. After this was done, no further pathology was noted.  Both wounds were irrigated and closed using interrupted 5-0 nylon suture.  Sterile dressings were applied, tourniquet was released and then the patient awakened and taken to recovery room in stable condition.  FOLLOWUP CARE:  [name] will be followed as an outpatient on Vicodin and Naprosyn.  I will see her back in the office in a week for sutures out and followup. Dictated by:   Elana Alm Thurston Hole, M.D. Attending Physician:  Twana First DD:  11/11/01 TD:  11/11/01 Job: [id number]

## 2011-02-03 NOTE — Discharge Summary (Signed)
Vanessa Calderon, KANTOR              ACCOUNT NO.:  [id number]   MEDICAL RECORD NO.:  [id number]          PATIENT TYPE:  INP   LOCATION:  2018                         FACILITY:  [facility]   PHYSICIAN:  Sheliah Plane, MD    DATE OF BIRTH:  1946-03-09   DATE OF ADMISSION:  02/08/2005  DATE OF DISCHARGE:  02/18/2005                                 DISCHARGE SUMMARY   PRIMARY ADMITTING DIAGNOSIS:  Chest pain.   ADDITIONAL/DISCHARGE DIAGNOSES:  1.  Acute coronary syndrome.  2.  Severe coronary artery disease.  3.  Left internal carotid artery stenosis.  4.  Hypertension.  5.  Type 2 non-insulin-dependent diabetes mellitus.  6.  History of migraines.  7.  History of anxiety/depression.  8.  Postoperative anemia.  9.  Mild postoperative renal insufficiency.   PROCEDURES PERFORMED:  1.  Cardiac catheterization.  2.  Coronary artery bypass grafting x4 (left internal mammary artery to the      LAD, saphenous vein graft to the diagonal, saphenous vein graft to the      ramus intermediate, saphenous vein graft to PDA).  3.  Left carotid endarterectomy with Dacron patch angioplasty.  4.  Endoscopic vein harvest, right leg.   HISTORY:  The patient is a 65 year old female who approximately 4 days prior  to this admission developed chest pain on exertion which was relieved with  rest. It was associated with some shortness of breath, dizziness, and  diaphoresis. On the day of this admission, she had an 8/10 episode of chest  pain which was associated with nausea and diaphoresis. Because of the severe  symptoms, she was brought into the emergency department for further  evaluation.   HOSPITAL COURSE:  She was given nitroglycerin and on arrival in the  emergency department bringing the chest pressure down to a 4/10. Her initial  EKG showed no evidence of acute ischemia. Because of these findings, she was  admitted for further evaluation and cardiology consultation was obtained.  She was  ultimately taken to the cardiac cath lab on Feb 08, 2005 where she  underwent cardiac catheterization by Dr. Katrinka Blazing. She was found to have severe  three-vessel coronary artery disease which was not felt to be amenable to  percutaneous intervention. She was started on IV heparin and Integrilin and  was returned to the floor. Cardiothoracic surgery consultation was obtained  for consideration of revascularization. The patient was seen by CVTS and it  was agreed her best course of action would be to proceed with CABG at this  time. After explanation of risks, benefits, and alternatives of the  procedure, the patient agreed to proceed with surgery. As part of her  preoperative workup, she underwent a carotid Doppler study which showed a  severe left internal carotid artery stenosis. The patient had no history of  neurologic symptoms but because of the severity of her stenosis, a vascular  surgery consultation was obtained. Dr. Waverly Ferrari saw the patient  and felt that she would be at increased risk for perioperative stroke if she  were to proceed with CABG at this time. He did  recommend combined left  carotid endarterectomy along with her CABG procedure and the patient agreed  to this procedure as well. She remained stable in the hospital prior to  surgery. She was able to be taken to the operating room on Feb 14, 2005  where she underwent CABG x4 performed by Dr. Tyrone Sage and left carotid  endarterectomy with Dacron patch angioplasty performed by Dr. Edilia Bo. She  tolerated the procedure well and was transferred to the SICU in stable  condition. She was able to be extubated shortly after surgery. She was  hemodynamically stable and doing well on postop day one. She was initially  maintained on a low dose dopamine drip and this was weaned and discontinued  over the course of her first postoperative day. Her chest tube and  hemodynamic monitoring devices were removed. She initially  required the blue  commander protocol for her blood sugars but this was discontinued when her  p.o. intake improved and she was started on Lantus. She was also transfused  a unit of packed red blood cells for hemoglobin of 7.3 and hematocrit 21.9.  She remained in the unit for further observation. By postop day two, she was  stable and ready for transfer to the floor. She has been volume overloaded  and has been started on Lasix and potassium. She did have a mild elevation  of her creatinine postoperatively to 1.6 but this has returned to a baseline  of 1.1 at the present time. She is ambulating with cardiac rehab phase one  and is making good progress. She is still on supplemental oxygen and is  maintaining O2 sats of greater than 90%. She is undergoing aggressive  pulmonary toilet measures and it is anticipated that she will be weaned from  the oxygen and will be discontinued over the next 24 hours. Also, as her  p.o. intake has improved significantly and her blood sugars have remained  fairly stable, she has been restarted on her home medications and Lantus has  been discontinued. She is tolerating a regular diet and is having normal  bowel and bladder function. On physical exam, she has evidence of mild  volume overload with crackles in her lung bases and mild lower extremity  edema. Her surgical incision sites are all healing well, however. Her labs  on February 17, 2005 showed a sodium 139, potassium 4.3, BUN 22, creatinine 1.1,  hemoglobin 8.4, hematocrit 24.5, white count 18.9, platelets 148. It is  anticipated if she continues to remain stable over the next 24-48 hours and  no acute changes occur, she will hopefully be ready for discharge home at  that time. She will have a repeat CBC and basic metabolic panel on February 18, 2005.   DISCHARGE MEDICATIONS:  1.  Enteric-coated aspirin 325 milligrams daily.  2.  Toprol-XL 25 milligrams daily.  3.  Zocor 40 milligrams daily. 4.  Lasix 40  milligrams daily for 1 week.  5.  K-Dur 20 mEq daily for 1 week.  6.  Prozac 40 milligrams daily.  7.  Glucophage 500 milligrams daily.  8.  Byetta 5 milligrams subcutaneously b.i.d.  9.  Quinine sulfate one q.h.s.  10. She is asked not to restart Lotrel until instructed by her physician.  11. She will also be given a prescription for Tylox one to two q.4 h p.r.n.      for pain.   DISCHARGE INSTRUCTIONS:  She is asked to refrain from driving, heavy  lifting, or strenuous activity. She  may continue ambulating daily and using  her incentive spirometer. She may shower daily and clean incisions with soap  and water. She will continue carbohydrate modified low-fat, low-sodium diet.   DISCHARGE FOLLOWUP:  She is asked to make an appointment to see Dr. Katrinka Blazing in  2 weeks. She had a chest x-ray at that visit. She has been asked to follow  up with Dr. Tyrone Sage on March 09, 2005 at 11:20 a.m.[name] She should bring her  chest x-ray to this appointment for Dr. Tyrone Sage to review as well. She will  call our office in the interim if she experiences any problems or has  questions.      GC/MEDQ  D:  02/17/2005  T:  02/18/2005  Job:  [id number]   cc:   Merlene Laughter. Renae Gloss, M.D.  [address]  [address]  [place]  [place] [address]  Fax: [phone]   Lyn Records III, M.D.  [address]  [address]  [place]  [place] [address]  Fax: [phone]

## 2011-02-03 NOTE — Op Note (Signed)
[facility]  Patient:    HADYN, AZER Visit Number: [id number] MRN: [id number]          Service Type: GYN Location: 4W 0454 01 Attending Physician:  Lendon Colonel Dictated by:   Jackquline Denmark. Kyla Balzarine, M.D. Proc. Date: 04/08/02 Admit Date:  04/08/2002 Discharge Date: 04/11/2002   CC:         Katherine Roan, M.D.  Telford Nab, R.N.   Operative Report  SURGEON:  John T. Kyla Balzarine, M.D.  ASSISTANT:  Katherine Roan, M.D.  PREOPERATIVE DIAGNOSES:  Pelvic cyst status post hysterectomy.  POSTOPERATIVE DIAGNOSES:  Peritoneal inclusion cyst, multiple pelvic adhesions, and enteral adhesions.  PROCEDURE:  Exploratory laparotomy with extensive adhesiolysis greater than one hour, incision of peritoneal inclusion cyst, trachelectomy, repair of serotomies.  ANESTHESIA:  General endotracheal.  DESCRIPTION OF FINDINGS AND INDICATIONS FOR SURGERY:  This 65 year old woman had prior history of an abdominal hysterectomy for large leiomyomata. Supracervical hysterectomy was performed.  She had presented with complaints of left lower quadrant abdominal pain and scans revealed a 3 cm cyst adjacent to her cervical stump.  At the time of exploratory laparotomy she was found to have dense enteral adhesions to the anterior abdominal wall, dense enteral adhesions, adhesions between small intestine and colon to pelvic side walls, bladder, and pelvic peritoneum, and between the cervix and the sigmoid colon. A 3 cm peritoneal inclusion cyst was located overlying the left uterosacral ligament.  Several small serotomies were oversewn after completion of the procedure.  It should be noted that it took in excess of one hour and 15 minutes to lyse adhesions sufficient to place a retractor to perform the gynecologic procedure.  DESCRIPTION OF PROCEDURE:  The patient was prepped and draped in the low lithotomy position using direct placement stirrups and explored  through a lower abdominal midline incision excising cheloid scar.  The incision was developed with scalpel and electrocautery for hemostasis.  The peritoneum could not be entered as intestines were adherent to the anterior abdominal wall through the length of the lower abdominal incision.  Using sharp and blunt dissection, the bowel loops were dissected off of the anterior parietoperitoneum.  During this process several small serotomies were developed but no entry was made into the bowel lumen.  Eventually, small bowel was mobilized from the anterior abdominal wall and pelvic side walls sufficient to allow placement of a Buchwalter retractor.  The final loops of small bowel adherent to sigmoid colon and right presacral region were freed using sharp and blunt dissection and small bowel totally backed out of the pelvis.  The sigmoid colon was densely adherent to the left pelvic side wall throughout and looped up over the cervical stump to the bladder.  Using sharp and blunt dissection this was taken down.  Electrocautery was used for hemostasis in addition to suture ligature with 2-0 Vicryl.  Finally, the sigmoid colon was mobilized off of the cervix and left pelvic side wall.  A 3 cm cyst with dense capsule was present overlying the left uterosacral/cardinal ligament adjacent to the cervical stump.  Using sharp and blunt dissection, this was mobilized off the peritoneum.  The cyst did rupture during the dissection, but was a simple cyst with clear fluid, nonsuspicious.  Using the sponge stick as a guide, the body of the cervix was identified and grasped with a tenaculum.  Using sharp and blunt dissection the adherent anterior bladder flap was developed, mobilizing bladder off of the cervical stump and upper vagina.  The sigmoid colon was fully mobilized posteriorly.  Sequential pedicles were felt by clamping adjacent to the cervix, dividing, and ligating with 2-0 Vicryl suture.  The final  pedicles entered the lateral fornices of the vagina and the specimen was amputated with the cyst being removed in continuity with the cervical stump.  The vaginal cuff was closed with locked running 0 Vicryl.  The abdomen and pelvis were copiously irrigated and additional hemostasis achieved were necessary with electrocautery.  All packs and retractors were removed and the abdominal wall was closed in layers, irrigating between layers, and achieving final hemostasis with electrocautery. A running modified Smead-Jones closure of 0 PDS was used to close the rectus fascia and muscles running 3-0 Vicryl to close subcutaneous adipose tissue and skin clips to close the skin.  The patient tolerated the procedure well and was returned to the recovery room in stable condition.  Estimated blood loss 250 cc.  Transfusions:  None.  Pathology specimens:  Pelvic peritoneal inclusion cyst and cervix.  ADDENDUM:  Prior to closing the abdominal wall, the small intestine was inspected in its entirety and serotomies were oversewn with interrupted figure-of-eight sutures of 2-0 Vicryl.  Additionally, an amp of indigo carmine was instilled intravenously as the cervix was being removed.  There was no evidence of ureteral dilatation or urine leakage during this phase of the procedure. Dictated by:   Jackquline Denmark. Kyla Balzarine, M.D. Attending Physician:  Lendon Colonel DD:  04/08/02 TD:  04/12/02 Job: [id number]

## 2011-02-03 NOTE — H&P (Signed)
NAME:  Vanessa Calderon, BLACKSHER                        ACCOUNT NO.:  [id number]   MEDICAL RECORD NO.:  [id number]                   PATIENT TYPE:  INP   LOCATION:  0451                                 FACILITY:  [facility]   PHYSICIAN:  Skeet Simmer., M.D.         DATE OF BIRTH:  [birth year]   DATE OF ADMISSION:  04/15/2002  DATE OF DISCHARGE:                                HISTORY & PHYSICAL   CHIEF COMPLAINT:  Abdominal pain.   HISTORY OF PRESENT ILLNESS:  Patient is a 65 year old black female who  underwent pelvic surgery on 04/07/2002 by Dr. Elana Alm and Dr. Kyla Balzarine.  This  was done because of left-sided pelvic pain with a 4-cm pelvic mass, which  was found finally to be benign.  She underwent resection of the mass and  completion of hysterectomy as she had had a supracervical hysterectomy  previously.  The patient was noted to have very marked adhesions of small  bowel in the pelvis requiring extensive dissection at the time of surgery.  She had some serious injuries of the bowel but no full-thickness injuries at  that time.  She made a pretty good recovery, with slow return of GI  function, and was discharged from the hospital on 07/26.  She returned to  see Dr. Elana Alm on the date of admission with abdominal pain and vomiting.  The pain was intermittent, crampy, and sometimes severe.  I saw her at the  emergency room.  She had slightly elevated white count, with somewhat tender  abdomen, and abdominal x-rays suggesting possible small bowel obstruction.  She was admitted to the hospital for bowel rest, IV fluids, and medicine for  control of pain.   PAST MEDICAL HISTORY:  Patient has hypertension.  She has no history of any  heart problems.  She is allergic to penicillin and codeine.  She smokes  about a half a pack of cigarettes a day.  She does not drink alcoholic  beverages.  She has no pulmonary problems.  She has diabetes.  She has had a  cholecystectomy and an appendectomy in  addition to her pelvic surgery.  See  previous records for more details.   MEDICATIONS:  Furosemide, Avandia, fluoxetine, Cardura, Bromfenex, Diovan,  Premarin.   PHYSICAL EXAMINATION:  Patient is a moderately obese black female.  She  appeared somewhat ill, although in no acute distress.  Vital signs  unremarkable as recorded by nursing staff.  Head, neck, eyes, ears, nose,  mouth and throat unremarkable.  CHEST: Clear to auscultation.  HEART:  Rate  and rhythm unremarkable.  ABDOMEN:  Moderately distended.  Slightly tender.  No masses appreciated.  Healing lower midline incision.  PELVIC:  Not  performed.  RECTAL:  Not performed.  EXTREMITIES:  No swelling or deformity.   IMPRESSION:  1. Probable small bowel obstruction.  2. Diabetes mellitus, Type 2.  3. Hypertension.  4. Obesity.   PLAN:  Bowel rest with  possible nasogastric suction and close observation.                                               Skeet Simmer., M.D.    Elvis Coil  D:  04/18/2002  T:  04/24/2002  Job:  [id number]

## 2011-02-03 NOTE — Discharge Summary (Signed)
Vanessa Calderon, Vanessa Calderon              ACCOUNT NO.:  [id number]   MEDICAL RECORD NO.:  [id number]          PATIENT TYPE:  INP   LOCATION:  3711                         FACILITY:  [facility]   PHYSICIAN:  Lyn Records, M.D.   DATE OF BIRTH:  15-Jul-1946   DATE OF ADMISSION:  07/16/2006  DATE OF DISCHARGE:  07/17/2006                                 DISCHARGE SUMMARY   DISCHARGE DIAGNOSES:  1. Palpitations.  2. Coronary artery disease, history of coronary artery bypass grafting in      2006, as well as a drug-eluting stent to the saphenous vein graft to      the ramus in December of 2006.  3. Hyperlipidemia.  4. Hypertension.  5. Diabetes mellitus.  6. Anxiety/depression.  7. History of medical noncompliance.  8. Long term medication use.  9. History of migraines.  10.Status post left carotid endarterectomy, hysterectomy, cholecystectomy,      appendectomy.  11.History of small bowel obstructions, resulting in a laparotomy.   Vanessa Calderon is a 65 year old female who was admitted to [facility] on  July 16, 2006 with palpitations.  She denied any chest pain, dyspnea,  dizziness, syncope, presyncope.  She was taken to the medical office at her  job and was given a sublingual nitroglycerin.  She thinks that this might  have helped.  She has had no further palpitations.  We watched her in the  hospital overnight and there were no dysrhythmias.  Her electrolytes are  normal.  TSH is pending at this time.  We will discharge the patient to home  with an event monitor and this has been arranged.  She is to follow up with  Dr. Katrinka Blazing on August 22, 2006 at 10:30 a.m. after the monitor has been  placed for over approximately one month.   She should remain on a low-fat, diabetic diet.   Continue her home medications, which include:  1. Metoprolol 25 mg b.i.d.  2. Prozac 40 mg a day.  3. Enteric-coated aspirin 325 mg daily.  4. Lotrel once a day.  The medication list that I have states that it  is a      10/20 dose, although there have been some discrepancies.  I have asked      the patient to bring her medications to the office.  5. Sublingual nitroglycerin p.r.n. chest pain.  6. Lasix 40 mg a day.  7. Potassium 20 mEq a day.  8. Plavix 75.  9. Simvastatin 40 mg a day.  10.Insulin as prior to admission.      Guy Franco, P.A.      Lyn Records, M.D.  Electronically Signed    LB/MEDQ  D:  07/17/2006  T:  07/17/2006  Job:  [id number]

## 2011-02-03 NOTE — Discharge Summary (Signed)
NAME:  Vanessa Calderon, Vanessa Calderon                     ACCOUNT NO.:  [id number]   MEDICAL RECORD NO.:  [id number]                   PATIENT TYPE:  EMS   LOCATION:  ED                                   FACILITY:  [facility]   PHYSICIAN:  Katherine Roan, M.D.               DATE OF BIRTH:  02/02/1946   DATE OF ADMISSION:  04/08/2002  DATE OF DISCHARGE:  04/09/2002                                 DISCHARGE SUMMARY   ADMISSION DIAGNOSES:  1. Pelvic mass.  2. Abdominal pain.   OPERATION PERFORMED:  Exploratory laparotomy, lysis of adhesions, removal of  cervix and pelvic mass.   BRIEF HISTORY:  This patient is a 65 year old female with a history of a  hysterectomy in [place].  She was referred for abdominal pain with a  diagnosis of a focal cystic mass on top of a cervix.  She was seen in  consultation by Dr. Serita Kyle who agreed with Dr. Kyla Balzarine to perform  the surgery as a primary surgery.   LABORATORY STUDIES:  Admission hemoglobin was 14.7.  Postoperative  hemoglobin was 12.1.  White count 13,000.  Routine chemistry admission was  glucose 396, postoperatively on the 23rd was 247.   Hemoglobin A1C was 10.8.   HOSPITAL COURSE:  The patient was admitted to the hospital.  Under the  direction of Dr. Ronita Hipps the patient underwent exploratory laparotomy  with multiple adhesions and peritoneal inclusion cyst.  She underwent  enterolysis, trachelectomy, and resection of the peritoneal cyst.  The  findings were extensive peritoneal adhesions.   Her postoperative course was stable.  She had a little bit of nausea.  She  was seen by the [facility] hospitalists who agreed to help manage her diabetes.  On the 24th she had flatus and appeared to be doing very well.  She was  taking warm tea.  On the 26th she states she was passing gas and was quite  determined to go home.  She did voice some unhappiness with the hospital  staff.  On the day of discharge her incision appeared to be intact.   The  abdomen was soft.  I warned her about liquids and the extensive nature of  the adhesions.  I told her to return to the office in  approximately three days to have her staples removed.  She is to call for  nausea, vomiting, or abdominal pain.  Of note, pathology report revealed a  normal cervix with a hydrosalpinx.   CONDITION ON DISCHARGE:  Improved.                                               Katherine Roan, M.D.    SDM/MEDQ  D:  05/13/2002  T:  05/14/2002  Job:  [id number]   cc:  John T. Kyla Balzarine, M.D.  [address]  [place]  [place] [address]  Fax: [phone]   Telford Nab, R.N.

## 2011-02-03 NOTE — H&P (Signed)
NAMEJERALDINE, Vanessa Calderon              ACCOUNT NO.:  [id number]   MEDICAL RECORD NO.:  [id number]          PATIENT TYPE:  INP   LOCATION:  3711                         FACILITY:  [facility]   PHYSICIAN:  Lyn Records, M.D.   DATE OF BIRTH:  22-Mar-1946   DATE OF ADMISSION:  07/16/2006  DATE OF DISCHARGE:  07/17/2006                                HISTORY & PHYSICAL   REASON FOR ADMISSION:  Palpitations.   Vanessa Calderon is a 65 year old female with a known history of coronary artery  disease.  She underwent coronary artery bypass grafting x4 by Dr. Sheliah Plane in May of 2006 with the following grafts:  LIMA to the LAD, SVG to  the diagonal, SVG to the ramus, SVG to the PDA.  She also had a drug eluting-  stent placed to the SVG to the ramus graft in December of 2006.  She  complains of a few-day history of palpitations and fatigue.  She denies any  chest pain, dyspnea, dizziness, syncope or presyncope.  She had an episode  at work on the day of admission and went to the company medical office.  The  patient was given sublingual nitroglycerin and the EMS was called.  She has  not had any further palpitations.  She never had any chest pain.   REVIEW OF SYSTEMS:  As above, otherwise negative.   PAST MEDICAL HISTORY:  1. CAD as above.  Her EF is 60% .  2. History of hyperlipidemia.  3. Hypertension.  4. Long-term medication use.  5. Diabetes mellitus.  6. History of medication noncompliance.  7. History of migraines.  8. Left carotid endarterectomy.  9. Anxiety/depression.  10.She has undergone a hysterectomy, cholecystectomy, appendectomy and      small-bowel obstruction which resulted in a laparotomy.   FAMILY HISTORY:  No known premature coronary artery disease.  Her father had  diabetes.   SOCIAL HISTORY:  Divorced, no alcohol or illicit drug use.  She has a  history of occasional cigarette smoking for about 30 years.   ALLERGIES:  PENICILLIN AND CODEINE CAUSES HIVES.   MEDICINES:  1. Metformin 500 mg b.i.d.  2. Prozac 40 mg a day.  3. Enteric-coated aspirin 325 mg a day.  4. Lotrel 10/20 one tablet daily.  5. Sublingual nitroglycerin p.r.n.  6. Metoprolol 25 mg twice a day.  7. Lasix 40 mg a day.  8. Potassium 10 mEq a day.  9. Plavix 75 mg a day.  10.Insulin daily.  11.Simvastatin 40 mg a day.   VITAL SIGNS:  Blood pressure 131/83, pulse 81, respirations 18, temperature  97, O2 saturations 98% on room air.  HEENT:  Grossly normal.  No carotid or subclavian bruits, no JVD or  thyromegaly.  CHEST:  Clear to auscultation bilaterally, no wheeze or rhonchi.  HEART:  Regular rate and rhythm.  There is a well-healed mediastinotomy  scar.  ABDOMEN:  Benign.  EXTREMITIES:  No peripheral edema.  There is a right leg venectomy scar well  healed.  NEURO:  She is alert and oriented x3.  She is somewhat anxious.  SKIN:  Warm and dry.   LAB STUDIES:  Hemoglobin 12.2, hematocrit 36.3, white count 11.2, platelets  325, BUN 15, creatinine 0.9, potassium 4.0.   Point-of-care markers negative x1.   EKG:  Normal sinus rhythm, rate 82 with T wave inversion in V1-V2, otherwise  nonspecific ST and T wave changes diffusely.   Chest x-ray:  Nonacute.   ASSESSMENT:  1. Palpitations.  2. CAD.  3. History of CABG and drug-eluting stents as mentioned above.  4. Hyperlipidemia.  5. Hypertension.  6. Diabetes mellitus, insulin dependent.  7. Anxiety/depression.  8. History of medication noncompliance.   PLAN:  We will observe the patient overnight on telemetry, check a TSH and  cycle enzymes.      Guy Franco, P.A.      Lyn Records, M.D.  Electronically Signed    LB/MEDQ  D:  07/17/2006  T:  07/17/2006  Job:  [id number]

## 2011-02-03 NOTE — Discharge Summary (Signed)
[name], [name]              ACCOUNT NO.:  [id number]   MEDICAL RECORD NO.:  [id number]          PATIENT TYPE:  INP   LOCATION:  2927                         FACILITY:  [facility]   PHYSICIAN:  Vanessa Calderon, M.D.   DATE OF BIRTH:  November 03, 1945   DATE OF ADMISSION:  08/30/2005  DATE OF DISCHARGE:  09/02/2005                                 DISCHARGE SUMMARY   HISTORY OF PRESENT ILLNESS:  [name] is a 65 year old female patient  with prior MI in May of 2006 and CABG x4 in June of 2006, with questionable  chronic chest pain since her open heart surgery.  She notes three to four  days prior to admission, she developed coughing and sneezing, productive  yellow phlegm.  No fevers, positive body aches, persistent sluggishness.  Two days ago, patient with mild chest pain radiating around the ribs,  especially on the right.  Hurts when she moves.  She says she has a sticking  pain on the right.  This pain will wax and wane for two days.  She did not  take anything for the pain, including nitroglycerin.  The patient continued  to work due to an upcoming presentation.  She developed dyspnea on exertion  while walking 24 hours ago at about 7:30 on the p.m. before presentation.  She developed left arm and shoulder aching.  She felt like this was  bursitis, but this did radiate to the chest and lasted for several hours.  She has been anorexic since and has only eaten soup.  She awakened at 1:30  in the morning with shortness of breath, nausea and emesis, increased  weakness and lethargy, and was again experiencing dyspnea on exertion on the  morning of admission.  She went to work for the presentation.  When she  finished, she felt extremely given out and washed out.  She drove herself to  the ER with complaints of chest pain, shortness of breath, and severe  headache.  She has received IV morphine, aspirin, and O2, with no change in  symptoms.   PHYSICAL EXAMINATION:  VITAL SIGNS:  Vital  signs were stable.  Blood  pressure a little low at 108/78, pulse rate a little tachycardic at 110-115.  CHEST:  She was tender with palpation around her ribs and also had coarse  lung sounds.  Questionable crackles in the left base.  She was saturating  98% on 02.   LABORATORY DATA:  Her troponins were mildly elevated at 0.71 and 0.56 on her  first two point-of-care enzymes.  Her D-dimer was 0.36.  Sugar was up at  374.  White count was mildly elevated at 10,600.  EKG showed nonspecific ST  changes, with ST segment flutter in V2.  Chest x-ray showed nonspecific  haziness on the left side without heart failure or infiltrates.   ADMISSION DIAGNOSES:  1.  Chest pain and elevated troponin in a patient with a history of coronary      artery disease, most likely unstable angina, rule out graft failure.  2.  Headaches, muscle pain, and cough, consistent with viral syndrome.  3.  Diabetes with hyperglycemia, probably due to infection and ischemia.  4.  Hypertension.  5.  Migraines.  6.  Left carotid stenosis, prior left carotid endarterectomy.   HOSPITAL COURSE:  The patient was admitted to the telemetry unit where  cardiac isoenzymes were cycled.  She was started empirically on IV  nitroglycerin and Lovenox.  She was continued on her home medications.  Troponins remained mildly elevated.  As part of routine with her being  diabetic and elevated sugar, hemoglobin A1C was checked.  This was 10.6.  She continued to have significant problems with coughing and headache and  mild chest pain.  Nitroglycerin at 3 cc.  Because of the elevated hemoglobin  A1C, her metformin was increased, with recommendations to her that she  follow up with her primary care doctor after this admission.  Because of the  elevation in cardiac markers x5, i.e., the troponin I's were mildly  elevated, Dr. Katrinka Calderon planned for diagnostic coronary angiography.   On September 01, 2005, the patient was taken to the  catheterization lab for  diagnostic coronary angiography for a diagnosis of acute coronary syndrome.  The patient did have chest pain the night before.  Dr. Katrinka Calderon performed  successful catheterization.  The LAD was 70% occluded proximal, a midvessel  with competitive LIMA flow.  This was on the native angiography.  On the  bypass angiography, the SVG to diagonal was patent.  The SVG to the ramus  had 90% distal anastomotic area.  The SVG to the RCA was patent.  The LIMA  to the LAD was patent.  The patient subsequently underwent PCI to the SVG of  the ramus intermedius with Cypher stent implantation.  The patient tolerated  the procedure well.  She was __________ 18 hours __________ back on aspirin  and Plavix, and plans were to discharge home on the following morning if no  problems.   On September 02, 2005, the patient was stable without chest pain, ambulating.  Voiding was unremarkable, and she was deemed appropriate for discharge home  by Dr. Amil Calderon.   FINAL DISCHARGE DIAGNOSES:  1.  Unstable angina secondary to graft failure, saphenous vein graft to the      ramus intermedius.  2.  Status post percutaneous coronary intervention and Cypher stent to the      saphenous vein graft to the ramus intermedius.  3.  Viral syndrome with associated cough and myalgias.   DISCHARGE MEDICATIONS:  1.  Toprol-XL 50 mg daily.  2.  Lotrel 20/10 daily.  3.  Metformin 500 mg, increase to twice daily.  Do not resume until September 04, 2005 post-catheterization.  4.  Prozac 40 mg daily.  5.  Zocor 40 mg daily.  6.  __________ as before until seen by your primary care doctor.  7.  Norvasc 10 mg daily.  8.  Mucinex 600 mg twice daily or Robitussin elixir plain as directed on the      bottle.  9.  Aspirin 325 mg daily.  10. Furosemide 40 mg daily.  11. K-Dur or potassium as before.  12. Plavix 75 mg daily.  This is new.  DISCHARGE INSTRUCTIONS:  Return to work when you feel up to it.    DIET:  Heart-healthy diabetic diet.   ACTIVITY:  Increase activity slowly.  No lifting more than 5 pounds for one  week.  May shower, no bath or whirlpool or spa for one week.  No swimming,  no sitting in water.   FOLLOW UP:  She is to see Dr. Katrinka Calderon on September 19, 2005 at 3:15 p.m.  You are  to call your primary care doctor, Dr. Renae Calderon, to be seen in one to two  weeks regarding your diabetes to make note that your hemoglobin A1C was  10.6.  The outpatient diabetes program was also to be calling you regarding  an office visit on Monday.  Dr. Amil Calderon also recommends before restarting  the metformin that you go to Dr. Renae Calderon to have your renal function  checked.      Vanessa Calderon, N.P.      Vanessa Calderon, M.D.  Electronically Signed    ALE/MEDQ  D:  10/30/2005  T:  10/31/2005  Job:  [id number]   cc:   Merlene Laughter. Vanessa Calderon, M.D.  Fax: [phone]   Sheliah Plane, MD  [address]  [place]  [place] [address]

## 2011-02-03 NOTE — Consult Note (Signed)
[facility]  Patient:    Vanessa Calderon, Vanessa Calderon Visit Number: [id number] MRN: [id number]          Service Type: GON Location: GYN Attending Physician:  Jeannette Corpus Dictated by:   Rande Brunt. Clarke-Pearson, M.D. Proc. Date: 04/01/02 Admit Date:  04/01/2002   CC:         Katherine Roan, M.D.  Telford Nab, R.N.   Consultation Report  HISTORY OF PRESENT ILLNESS:  A 65 year old African-American female, referred for consultation by Dr. Kyra Manges, regarding newly diagnosed cystic pelvic mass.  The patient initially presented to Dr. Elana Alm and his office staff because of a two month history of left lower quadrant pain, left back pain, and left hip pain.  A mass was found and has been further evaluated with MRI and ultrasound.  The ultrasound shows a loculated cystic mass, measuring approximately 4.5 x 2.9 x 4.1 cm adjacent to a cervical stump.  MRI of the pelvis essentially shows the same findings.  The patients relevant history is that she underwent a supracervical hysterectomy and bilateral salpingo-oophorectomy in 1993 for treatment of symptomatic uterine fibroids ([place]).  Since that time, the patient has been taking Premarin 0.625 mg daily.  PAST MEDICAL HISTORY:  Medical illnesses obesity, hypertension, diabetes.  CURRENT MEDICATIONS: 1. Furosemide 40 mg q.d. 2. Avandia 8 mg q.d. 3. Fluoxetine 400 mg q.d. 4. ______  XT 240 mg q.d. 5. ______  PD cap 1 q.d. 6. Diovan 320 mg q.d. 7. Premarin 0.625 mg q.d.  DRUG ALLERGIES:  PENICILLIN.  OBSTETRICAL HISTORY:  Gravida 2.  FAMILY HISTORY:  Negative for gynecologic, breast, or colon cancer.  SOCIAL HISTORY:  The patient is divorced.  She smokes 1/2 pack-per-day for the last 37 years.  She works as an Environmental health practitioner at [employer].  REVIEW OF SYSTEMS:  Essentially negative except for left lower quadrant pain. She denies any other GI or GU  symptoms.  PHYSICAL EXAMINATION:  VITAL SIGNS:  Weight 225 pounds, blood pressure 156/90.  GENERAL:  The patient is a healthy African-American female in no acute distress.  HEENT:  Negative.  NECK:  Supple without thyromegaly.  ABDOMEN:  Obese, soft, nontender.  She has a well-healed right subcostal scar, midline incision, and appendectomy scar.  No hernias are noted.  No masses, organomegaly, or ascites are noted.  PELVIC:  EG/BUS is normal.  Vagina is clean, well-supported.  The cervix appears normal.  On bimanual examination, the cervix and the mass are indistinguishable.  The mass effect is approximately 5-6 cm above the vaginal cuff.  IMPRESSION:  Cystic pelvic mass, arising adjacent to a cervical stump following a supracervical hysterectomy.  Whether this is a collection of mucus from the cervix, an ovarian remnant, a Nabothian cyst, or other pathology, I would recommend that this be removed surgically.  The patient is in agreement to have her cervix removed at the same time.  The risks of surgery including hemorrhage; infection; injury to adjacent viscera, especially the bladder, ureters, and rectum; anesthetic risks and risks of venous thromboembolic complications are outlined to the patient.  She understands these risks and wishes to proceed with surgery.  She understands Dr. Kyla Balzarine will be the primary surgeon, assisted by Dr. Kyra Manges.  Surgery is scheduled for April 08, 2002.  All of the patients questions are answered. Dictated by:   Rande Brunt. Clarke-Pearson, M.D. Attending Physician:  Jeannette Corpus DD:  04/01/02 TD:  04/02/02 Job: [id number]

## 2011-02-03 NOTE — Discharge Summary (Signed)
[name], [name]              ACCOUNT NO.:  [id number]   MEDICAL RECORD NO.:  [id number]          PATIENT TYPE:  INP   LOCATION:  2927                         FACILITY:  [facility]   PHYSICIAN:  Francisca December, M.D.  DATE OF BIRTH:  [birth year]   DATE OF ADMISSION:  08/30/2005  DATE OF DISCHARGE:  09/02/2005                                 DISCHARGE SUMMARY   CHIEF COMPLAINT/REASON FOR ADMISSION:  [name] is a 65 year old female  patient with prior history of MI in May of 2006 and CABG x4 in June of 2006.  Apparently, she has had problems with chronic chest pain since her open  heart surgery. About 3 to 4 days prior to this most recent admission, the  patient developed coughing and sneezing, alternating with yellow phlegm. She  had no fever. She has body aches and persistent sluggishness.   Dictation Ended At [employer].      Allison L. Rolene Course      Francisca December, M.D.  Electronically Signed    ALE/MEDQ  D:  11/06/2005  T:  11/06/2005  Job:  [id number]   cc:   Merlene Laughter. Renae Gloss, M.D.  Fax: [phone]

## 2011-02-03 NOTE — Cardiovascular Report (Signed)
NAMEMADILINE, SAFFRAN              ACCOUNT NO.:  [id number]   MEDICAL RECORD NO.:  [id number]          PATIENT TYPE:  INP   LOCATION:  2931                         FACILITY:  [facility]   PHYSICIAN:  Lyn Records III, M.D.DATE OF BIRTH:  1945/12/06   DATE OF PROCEDURE:  02/08/2005  DATE OF DISCHARGE:                              CARDIAC CATHETERIZATION   INDICATIONS:  Acute coronary syndrome with recurring waxing and waning chest  discomfort and elevated troponins.   PROCEDURES PERFORMED:  1.  Left heart catheterization.  2.  Selective coronary angiography.  3.  Left ventriculography.   DESCRIPTION:  After informed consent, a 6-French sheath was placed in the  right femoral artery and coronary angiography was performed using a 6-French  A2 multipurpose catheter. This catheter was also used for hemodynamic  recordings, and left ventriculography by hand injection. We also used a #4 6-  [place] right Judkins catheter and administered intracoronary nitroglycerin  and performed repeat right coronary angiography. The patient tolerated the  procedure without significant complications.   RESULTS:   HEMODYNAMIC DATA:  1.  Aortic pressure 152/68.  2.  Left ventricular pressure 152/22.   LEFT VENTRICULOGRAPHY:  There is inferobasal moderate hypokinesis. EF though  is 70%. No MR.   CORONARY ANGIOGRAPHY:  1.  No significant calcification is noted.  2.  Left main coronary: Mild distal disease, perhaps up to 15-20% narrowing.  3.  Left anterior descending coronary: The entire midsegment is mildly to      moderately severely narrowed. There is segmental 50% to at most 70%      stenosis depending upon the view through the midsegment. Also the first      diagonal that arises in this region contains ostial 70-80% stenosis and      also 60-70% proximal stenosis. The LAD is transapical, large distally      and graftable.  4.  The circumflex artery: Circumflex coronary artery is relatively small      given origin to three small obtuse marginal branches. The distal-most      and largest obtuse marginal system is a small to moderate-sized      bifurcating system that may not be graftable. There is a 99% stenosis      before this bifurcation.  5.  The ramus intermedius branch: A large ramus intermedius branch arises      from the left main, contains an eccentric 95% stenosis. The distal      vessel is large, bifurcates and has a significant myocardial      distribution.  6.  The right coronary: The right coronary gives origin to PDA and left      ventricular branch. It is severely and diffusely diseased throughout the      proximal mid and distal segment. There are regions of the segmental      within the mid vessel where there is up to 90% stenosis and in the      distal vessel segmental regions of stenosis up to 99%. The PDA gives a      reasonably large distribution inferiorly coming to the  midinferior wall      and is large enough to be grafted.   CONCLUSIONS:  1.  Severe three-vessel coronary disease with high-grade segmental disease      in the mid and distal right coronary artery. This is a culprit vessel. A      large ramus intermedius contains high-grade proximal stenosis. The first      diagonal contains significant ostial stenosis and the distal/third      obtuse marginal contains 99% stenosis with a small in caliber and      possibly not graftable. The left anterior descending artery contains      moderate to moderately severe middisease with segmental 50-70% narrowing      depending on the view.  2.  Inferobasal mild hypokinesis. Normal overall left ventricular ejection      fraction with 70% calculated.   RECOMMENDATIONS:  The patient will be evaluated by CVTS. I believe surgery  may be her best alternative; however, PCI as possible. The RCA would require  quite a few stents but would be treatable with PCI and the ramus intermedius  would be treatable. The distal  circumflex is too small to treat to the LAD  by the parents unless there is cinegraphic demonstration of ischemia is  probably not ready to be treated. The diagonal, however, does contains  significant ostial disease but within the region of the LAD disease would be  difficult to treat percutaneously without having the risk of causing LAD  trouble. We will discuss with colleagues and proceed further after  consideration.       HWS/MEDQ  D:  02/08/2005  T:  02/08/2005  Job:  [id number]   cc:   Corwin Levins, M.D. [facility]   Patient's chart   Mobolaji B. Corky Downs, M.D.

## 2011-02-03 NOTE — Op Note (Signed)
NAMECHANISE, Vanessa Calderon              ACCOUNT NO.:  [id number]   MEDICAL RECORD NO.:  [id number]          PATIENT TYPE:  INP   LOCATION:  2018                         FACILITY:  [facility]   PHYSICIAN:  Sheliah Plane, MD    DATE OF BIRTH:  Mar 24, 1946   DATE OF PROCEDURE:  02/14/2005  DATE OF DISCHARGE:                                 OPERATIVE REPORT   PREOPERATIVE DIAGNOSIS:  Unstable angina, subendocardial myocardial  infarction.   POSTOPERATIVE DIAGNOSIS:  Unstable angina, subendocardial myocardial  infarction.   SURGICAL PROCEDURE:  Coronary artery bypass grafting x4 with left internal  mammary to the left anterior descending coronary artery, reverse saphenous  vein graft to the intermediate coronary artery, reverse saphenous vein graft  to the diagonal coronary artery, reverse saphenous vein graft to the  posterior descending coronary artery with right thigh and calf endovein  harvesting done in combination with a left carotid endarterectomy dictated  under a separate note.   SURGEON:  Sheliah Plane, M.D.   FIRST ASSISTANT:  Theda Belfast, P.A.   BRIEF HISTORY:  Patient is a 65 year old female who presented with unstable  anginal symptoms and minor elevation of her troponins.  She was stabilized  medically and underwent cardiac catheterization, which revealed significant  three-vessel coronary artery disease, including greater than 80% stenosis of  the LAD, involving the diagonal intermediate.  A very small distal  circumflex with disease but the distal circumflex was too small for bypass.  High-grade right coronary artery obstruction.  Because of the patient's  significant three-vessel coronary artery disease, coronary artery bypass  grafting was recommended.  The patient agreed and signed informed consent.  She was also found to preoperatively have a high-grade left carotid stenosis  and was evaluated by Dr. Edilia Bo, who recommended left carotid  endarterectomy.   DESCRIPTION OF PROCEDURE:  With Swan-Ganz and arterial line monitors in  place, the patient underwent general endotracheal anesthesia without  incidence.  The skin of the chest and legs was prepped with Betadine and  draped in the usual sterile manner.  Using the Guidant endovein harvesting  system, the vein was harvested from the right thigh to the mid left right  calf.  The vein was of good quality and caliber.  Median sternotomy was  performed.  Left internal mammary artery was dissected down as a pedicle  graft.  The distal artery was divided and had good free flow.  The  pericardium was opened.  Overall ventricular function preserved.  The  patient was systemically heparinized.  The ascending aorta and right atrium  were cannulated.  In the aortic root, a bent cardioplegic needle was  introduced into the ascending aorta.  The patient was placed on  cardiopulmonary bypass, 2.4 L/min/m2, and sites for the anastomosis were  selected and dissected out of the epicardium.  The patient's body  temperature was cooled to 30 degrees.  The aortic cross clamp was applied,  and 500 cc was administered.  With rapid diastolic arrest, the heart and  myocardial septal temperatures were monitored throughout the cross clamp.  Attention was turned first to the  intermediate coronary artery, which was  intramyocardial vessels, opened and admitted a 1.5 mm probe.  Using a  running 7-0 Prolene, distal anastomosis was performed with a segment of  reverse saphenous vein graft.  Attention was turned to the diagonal coronary  artery, which was a small vessel admitting a 1 mm probe.  Using a running 7-  0 Prolene, distal anastomosis was performed with a segment of reverse  saphenous vein graft.  Attention was then turned to the posterior descending  coronary artery, which was opened and admitted a 1.5 mm probe.  Using a  running 7-0 Prolene, distal anastomosis was performed.  Additional cold-  blood cardioplegia  was administered down the vein graft.  Attention was then  turned to the left anterior descending coronary artery, which was opened in  the mid portion using a running 8-0 Prolene.  The left internal mammary  artery was anastomosed to the left anterior descending coronary artery.  With the cross clamp still in place, three punch aortotomies were performed.  Each of the three vein grafts were anastomosed to the ascending aorta.  Air  was evacuated from the grafts, and the aortic cross clamp was removed.  Total cross clamp time was 92 minutes.  The patient was spontaneously  converted to a sinus rhythm.  Sites for the anastomosis were inspected and  free of bleeding.  Prior to cross clamp removal, the bulldog mammary artery  was removed, and there was prompt rise in myocardial septal temperature.  With her body temperature rewarmed, the patient was then ventilated and  weaned from cardiopulmonary bypass without difficulty.  He remained  hemodynamically stable and was decannulated in the usual fashion.  Protamine  sulfate was administered.  Ventricular pacing wires were applied.  __________ markers applied.  A left pleural tube and two mediastinal tubes  were left in place.  The pericardium was loosely reapproximated.  The  sternum was closed with a #6 stainless steel wire.  The fascia closed with  interrupted 0 Vicryl, running 3-0 Vicryl in the subcutaneous tissue, and a 4-  0 subcuticular stitch in the skin edges.  Dry dressings were applied.  Sponge and needle counts were reported as corrected at the closing of the  procedure.  The patient tolerated the procedure without obvious  complications and was transferred to the surgical intensive care unit for  further postoperative care.      EG/MEDQ  D:  02/15/2005  T:  02/15/2005  Job:  [id number]   cc:   Lyn Records III, M.D.  [address]  [address]  [place]  [place] [address] Fax: [phone]

## 2011-02-03 NOTE — H&P (Signed)
[facility]  Patient:    Vanessa Calderon, Vanessa Calderon Visit Number: [id number] MRN: [id number]          Service Type: GYN Location: 1S 0004 01 Attending Physician:  Sabino Donovan Dictated by:   Kathie Rhodes. Kyra Manges, M.D. Admit Date:  04/08/2002                           History and Physical  CHIEF COMPLAINT:   Abdominal pain.  HISTORY OF PRESENT ILLNESS:  Vanessa Calderon is a 65 year old female status post hysterectomy for uterine fibroids who presented in the office to see Harvest Dark, nurse practitioner, with left-sided pelvic pain.  MRI and ultrasound revealed a 4 cm pelvic mass beside the cervix.  She is status post supracervical hysterectomy.  She also has pain in her left flank.  Because of the persistent mass and the fact that it was in the paracervical area, consultation with Dr. De Blanch was obtained.  He and Dr. Kyla Balzarine are to be the primary surgeons as outlined in his office note for exploratory laparotomy and removal of pelvic mass.  Her comorbidities include hypertension and diabetes controlled with Avandia.  ALLERGIES:  She is also allergic to PENICILLIN and CODEINE.  PAST MEDICAL HISTORY:  Arthroscopy 20 years ago and appendectomy at age 53.  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  She notes no recent decrease in visual or auditory acuity.  No headaches.  No history of dizziness.  Heart: She is hypertensive, denies chest pain, denies history of heart murmur, denies rheumatic fever.  Lungs: No chronic cough, no asthma.  GU: No stress incontinence, no history of UTIs. GI: No bowel habit change, no melena or weight loss.  Muscle, bones, and joints: She is treated for arthritis of her right knee and is status post arthroscopy about 20 years ago.  SOCIAL HISTORY:  She works with [employer].  She smokes a half pack of cigarettes daily.  FAMILY HISTORY:  Her mother is 81, has hypertension and angina.  Her  father died at 12 from hypertension, CVA, and diabetes.  PHYSICAL EXAMINATION:  GENERAL:  Intelligent, well-developed, well-nourished African-American female who appears to be her stated age and is oriented to time, place, and recent events.  VITAL SIGNS:  Weight 224.  Blood pressure 140/90, pulse 80, respirations 16.  HEENT:  Examination of ears, nose, and throat was unremarkable.  Oropharynx is not injected.  NECK:  Trachea is in the midline.  Carotid pulses are equal without bruits. Thyroid is not enlarged, and no adenopathy is appreciated in the neck or supraclavicular fossa.  BREASTS:  No masses or tenderness.  LUNGS:  Clear to percussion and auscultation.  SKIN:  No lesions of a suspicious nature noted in the back or anterior chest.  HEART:  Normal sinus rhythm.  No murmurs.  No heaves, thrills, rubs, or gallops.  ABDOMEN:  Obese.  There is a midline incision which extends well above the umbilicus.  No masses noted.  She is also status post gallbladder removal. Bowel sounds are normal.  No bruits heard, no tenderness.  EXTREMITIES:  Good range of motion, equal pulses and reflexes.  PELVIC:  Examination reveals cervix without lesion.  Pap smear is normal. There are no pelvic masses that I can appreciate or tenderness at this time.  IMPRESSION:  Pelvic mass on MRI associated with pelvic pain.  PLAN:  Laparotomy with Dr.  Soper being Paramedic as outlined by Dr. Serita Kyle.  Vanessa Calderon has been appraised and is aware of the risks of infection, hemorrhage, and the risk of blood transfusion as well as bowel and bladder injury requiring repeat surgical procedures.  She is currently on a bowel prep as ordered by the oncology department. Dictated by:   S. Kyra Manges, M.D. Attending Physician:  Ronita Hipps T DD:  04/07/02 TD:  04/07/02 Job: [id number]

## 2011-02-03 NOTE — Discharge Summary (Signed)
NAME:  Vanessa Calderon, Vanessa Calderon                        ACCOUNT NO.:  [id number]   MEDICAL RECORD NO.:  [id number]                   PATIENT TYPE:  INP   LOCATION:  0451                                 FACILITY:  [facility]   PHYSICIAN:  Skeet Simmer., M.D.         DATE OF BIRTH:  [birth year]   DATE OF ADMISSION:  04/15/2002  DATE OF DISCHARGE:  05/07/2002                                 DISCHARGE SUMMARY   HISTORY OF PRESENT ILLNESS:  The patient is a 65 year old black female who  had undergone pelvic surgery on 04/07/02, by Dr. Elana Alm and Dr. Kyla Balzarine  because of left-sided pelvic pain and a 4 cm pelvic mass which was found to  be benign.  She underwent resection of the mass and a hysterectomy as she  had a supracervical hysterectomy previously.  There were marked small bowel  adhesions which were noted requiring extensive dissection at the time of  that surgery.  The patient had gone home eating and feeling well, but  returned to see Dr. Elana Alm because of abdominal pain and vomiting.  He  consulted me because of the suspicion of small bowel obstruction and I  agreed that the patient probably had that, and I admitted her to the  hospital.   PAST MEDICAL HISTORY:  1. Type 2 diabetes mellitus.  2. Hypertension.  3. Obesity.   See the history and physical for further details.   PHYSICAL EXAMINATION:  ABDOMEN:  There was moderate abdominal tenderness and  distention.   HOSPITAL COURSE:  The patient was admitted and kept n.p.o.  X-rays showed no  improvement in her small bowel obstruction.  The white blood cell count  initially was elevated a bit and fell to 10.3.  Sugars remained under  generally good control.  I could not tell for certain whether or not the  patient had an ileus or a small bowel obstruction, and continued her on  bowel rest.  At first, she refused to have a nasogastric tube, but she  vomited a large amount of bilious material, and agreed to have the NG tube  on  04/16/02.  Continued to follow along but she did not improve.  The white  count rose a bit, and her abdomen became more tender on 04/18/02.  I advised  exploration for small bowel obstruction.  The patient agreed to the  operation.  I operated on her on 04/18/02.  She had extremely dense and  difficult adhesions throughout the small bowel.  There was small bowel  obstruction, although the exact site of obstruction was in a massive matted  bowel which I could not successfully unwind by lysis of adhesions, and I had  to resect it.  One other small area required resection.  I felt that all of  her bowel was healthy in appearance after completion of both resections.  The patient had a rather prolonged ileus after the operation.  She had some  fever, and was maintained on broad spectrum antibiotics.  She did improve  slowly with resolution of ileus, and a fall in the white count.  However,  she developed recurrent vomiting and it was felt that she probably had  recurrent obstruction.  Nasogastric tube was put back, and she was placed  back on total parenteral nutrition.  She tolerated that well.  The wound had  no evidence of infection.  She began to pass gas, and then tolerated  plugging of the nasogastric tube on 05/05/02.  She was able to eat well, and  since the subclavian catheter had gotten accidentally pulled back, it was  removed.  We kept her in the hospital until 05/07/02, and she continued to  eat well and remained afebrile for the past day.  She did have an urinary  tract infection which was treated with Cipro.   DISCHARGE MEDICATIONS:  1. She was sent home on her usual medications.  2. In addition, she requested a prescription for Prozac which I gave her.  3. Cipro.  4. Vicodin.   FOLLOWUP:  I asked her to return to see me in two to three weeks at the  office.   I would note as an addendum that after discharge the next morning the  patient came to the emergency room with bilious  drainage through her  incision, and had an obvious enterocutaneous fistula.  This was totally  unsuspected at the time of her discharge.  She refused to allow me to care  for her, and has been transferred to [facility] for care at the hospital  there.   DIAGNOSES:  1. Small bowel obstruction.  2. Diabetes mellitus type 2.  3. Extensive small bowel adhesions.  4. Hypertension.  5. Obesity.   OPERATION:  Lysis of adhesions and small bowel obstruction.   CONDITION ON DISCHARGE:  Stable.                                               Skeet Simmer., M.D.    Elvis Coil  D:  05/13/2002  T:  05/13/2002  Job:  [id number]   cc:   S. Kyra Manges, M.D.   John T. Kyla Balzarine, M.D.  [address]  [place]  [place] [address]  Fax: [phone]

## 2011-02-03 NOTE — Op Note (Signed)
Vanessa Calderon, HEARNS                       ACCOUNT NO.:  [id number]   MEDICAL RECORD NO.:  [id number]                   PATIENT TYPE:  INP   LOCATION:  0451                                 FACILITY[facility]   PHYSICIAN:  Skeet Simmer., M.D.         DATE OF BIRTH:  [birth year]   DATE OF PROCEDURE:  04/18/2002  DATE OF DISCHARGE:                                 OPERATIVE REPORT   PREOPERATIVE DIAGNOSES:  Small intestinal obstruction due to adhesions.   POSTOPERATIVE DIAGNOSES:  Small intestinal obstruction due to adhesions.   OPERATION PERFORMED:  Extensive lysis of adhesions and resection of two  segments of small intestine with anastomosis.   SURGEON:  Zigmund Daniel, M.D.   ASSISTANT:  Gabrielle Dare. Janee Morn, MD   ANESTHESIA:  General.   INDICATIONS FOR PROCEDURE:  The patient is a 66 year old black female who is  about 10 days status post removal of the cervix and a left pelvic mass.  At  that time extensive adhesions were noted.  She got over the surgery but  after going home began to have vomiting, abdominal pain and obstipation.  She had been in the hospital for three days with slightly progressive x-ray  abnormalities of small bowel obstruction despite nasogastric suction which  was producing copious bilious drainage.  CT scan had shown distal small  bowel obstruction with obstructing point probably in the pelvis.  White  count had risen slightly.  The abdomen was somewhat tender.  The patient was  advised to undergo laparotomy for relief of small bowel obstruction with  risks of bowel injury, possible need for resection, bleeding, and infection.  She accepted the risks.   DESCRIPTION OF PROCEDURE:  After the patient was monitored and had general  anesthesia and a Foley catheter and routine preparation and draping of the  abdomen, I opened the previous lower midline incision and extended it upward  to above the umbilicus.  I dissected down and removed the  suture which was  present and then dissected upward incising the fascia.  On entering the  abdomen, the bowel was stuck rather tightly to the anterior abdominal wall.  In dissecting up past the area of previous dissection, the adhesions were  also found to be dense.  I very tediously took down the bowel adhesions from  the anterior abdominal wall all the way out into the gutter bilaterally.  The dissection was accidentally carried into the right paracolic gutter but  the error was realized and the dissection plane restored to the area medial  to the colon.  The left side was very difficult to distinguish the small  bowel from the colon because of very inflamed fat, very exuberant scar  tissue.  In doing the dissection, a number of holes were made in the small  bowel and the succus was suctioned out.  I carried the dissection down into  the pelvis and mobilized the small bowel out  of the pelvis.  It was stuck  rather densely to the retroperitoneum.  I kept the dissection right against  the small bowel so as to avoid injury to the ureters and the colon.  I found  a little bit of free space in the upper abdomen on each side and freed the  bowel from the retroperitoneum there and identified the jejunum emerging out  the ligament of Treitz.  I freed the small bowel from the transverse colon  and its mesentery and also from the right colon and its mesentery.  I  identified the  terminal ileum which was collapsed.  There was a large mass  of matted thick obviously dysfunctional small bowel in the pelvis and in the  midabdomen with distended bowel going down to it.  Dissection into this mass  of small bowel was almost impossible due to the density of the adhesions.  A  number of holes had already been made in that portion of bowel.  I  completely mobilized it and then dissected as much healthy bowel from it  proximally as possible.  Likewise, I dissected up as much distal ileum as I  could until it  entered this huge mass of matted small bowel.  There was one  other fairly long loop which could be easily separated from it in the  superior aspect and I did so.  Because so much of this small bowel was  involved in this obstructive process and obviously was going to require  resection I felt that two separate areas of resection should be done and  preserved as much as possible.  I went across the bowel and each of those  locations with the cutting stapler and I then carefully resected the bowel  by clamping across the mesentery segmentally with Kelly clamps and ligating  and suture ligating the vessels as needed.  I then brought the bowel  together side by side at each of the two sites of resection and performed  side-to-side anastomoses with the cutting stapler and closed the remaining  openings with the linear stapler after ascertaining that hemostasis was good  in the lumen of the bowel.  I reinforced the closure slightly with 3-0 silk  stitches and closed the mesenteric defects with 2-0 silk stitches.  All of  the bowel appeared to be healthy.  The anastomoses seemed to be wide open.  I couldn't reach out to check the position of the nasogastric tube but it  was functioning well.  I then very carefully inspected the remaining areas  of dissection for hemostasis and got it with sutures and cautery as needed.  I looked in the area of the right ureter but the scar tissue was so  exuberant that I couldn't safely dissect for it and since there was no blood  in the urine and no evident leakage of urine into the abdomen, I felt there  was unlikely to be a ureteral injury.  I copiously irrigated the abdominal  cavity and removed the irrigant.  The sponge, needle and instrument counts  were correct.  I closed the abdomen with a running #1 PDS beginning at each  end and tying suture to itself in the middle.  I then closed the skin with staples after irrigating the subcutaneous tissues and  getting hemostasis  there.  The patient was stable at the conclusion of the procedure.  Skeet Simmer., M.D.    Elvis Coil  D:  04/18/2002  T:  04/24/2002  Job:  [id number]   cc:   S. Kyra Manges, M.D.   [place]ie [place]

## 2011-02-03 NOTE — H&P (Signed)
NAMEJANNICE, BEITZEL              ACCOUNT NO.:  [id number]   MEDICAL RECORD NO.:  [id number]          PATIENT TYPE:  INP   LOCATION:  1824                         FACILITY:  [facility]   PHYSICIAN:  Mobolaji B. Bakare, M.D.DATE OF BIRTH:  04/11/1946   DATE OF ADMISSION:  02/08/2005  DATE OF DISCHARGE:                                HISTORY & PHYSICAL   CHIEF COMPLAINT:  Chest pressure and headaches.   HISTORY OF PRESENTING COMPLAINT:  Vanessa Calderon is a pleasant 65 year old  African American female with history of type 2 diabetes mellitus and  hypertension.  She developed chest pressure four days ago, which she noticed  while on exertion and chest pressure resolved on resting.  While she gets  this chest pressure. she developed shortness of breath last night while  doing things around the house, she had a recurrence of the chest pressure.  It was actually chest pain at this time and she rated it as 8/10.  It was  accompanied by dizziness, diaphoresis.  She felt clammy and nauseous, but  she did not vomit.  These symptoms resolved after a resting.   She also has a cough, which is productive of whitish phlegm and she gets  sharp pain, which is pleuritic in nature when she coughs.  She has two  pillow orthopnea, no paroxysmal nocturnal dyspnea and no pedal edema,  although the patient is on Lasix for fluid retention.  On arrival in the  emergency department, she was given nitroglycerin, which brought the chest  pressure down from 8/10 to 4/10.  Currently, she is having dull chest  pressure.  Cardiac enzymes done in ER shows a progressively increasing CK-MB  and CK, but still within normal limits.  First troponin was 0.18, second  troponin 0.21 and still waiting for the third panel.  EKG showed no evidence  of acute ischemia.   Three days ago, she developed headache that was frontal temporal and like a  band radiating to the neck.  It is constant and she only got relief after  arrival in  the emergency department from Dilaudid.  She has preference for  quiet places and dark rooms.  The headache is associated with photophobia  and phonophobia with some nausea.  She has a history of migraine headaches  in the past, but she thinks this is not like what she used to have.  There  is no associated fever, no neck stiffness, no change in her vision.  There  is actually no vomiting.   Of note, the patient ran out of medications about a week ago and she was  supposed to pick it up at the pharmacy today.   REVIEW OF SYSTEMS:  Positive for constipation.  She denies dysuria, urgency  or increased frequency of micturition.  No abdominal pain, no diarrhea.  No  fever, no chills.   PAST MEDICAL HISTORY:  1.  Hypertension.  2.  Diabetes mellitus type 2 for about four years.  3.  Anxiety, depression.  4.  Body cramps, for which she uses quinine sulfate.  5.  History of migraine headaches.  PAST SURGICAL HISTORY:  1.  Hysterectomy.  2.  Cholecystectomy.  3.  Appendectomy.  4.  Laparotomy for small bowel adhesions.   MEDICATIONS:  1.  Lotrel 1 p.o. daily.  2.  Lasix 40 mg p.o. daily.  3.  Prozac 40 mg p.o. daily.  4.  Glucophage 500 mg p.o. daily.  5.  Quinine sulfate 1 q.h.s.  6.  Byetta 5 mg b.i.d. subcutaneous.   ALLERGIES:  Severe hives to PENICILLIN.   FAMILY HISTORY:  She does not have any family history of myocardial  infarction.  Her mother has angina, hypertension and arthritis; she is 40  years old.  Her father died of complications of diabetes.  She is the only  child.  She is divorced and has children ages 43 and 33.  They are  daughters.   SOCIAL HISTORY:  She smokes cigarettes and she has been smoking less than  one pack per day for a long time, most likely for about 30 years.  The  patient is ashamed to mention the duration of her smoking.  She does not  drink alcohol or use illicit drugs.   PHYSICAL EXAMINATION:  VITAL SIGNS:  Blood pressure 188/95, pulse  90,  respiratory rate 24. Oxygen saturation 98.  Temperature 97.5.  GENERAL:  She is obese, not in respiratory distress.  HEENT:  Normocephalic, atraumatic head.  Pupils are equal, round and  reactive to light.  Extraocular muscle movements intact.  Oropharynx moist  without thrush.  NECK:  No carotid bruit.  No elevated JVD.  No thyromegaly.  No cervical  lymphadenopathy.  LUNGS:  Clear to auscultation.  CARDIOVASCULAR:  S1 and S2 regular.  No murmur, no gallop, no rub.  ABDOMEN:  Obese, soft, nontender.  Obvious scars from cholecystectomy and  laparotomy.  No hepatosplenomegaly.  Bowel sounds present.  RECTAL:  No mass felt in the rectum.  Stool negative.  EXTREMITIES:  No pedal edema.  No calf tenderness.  Dorsalis pedis pulses  palpable bilaterally 2+.  CNS:  No focal neurologic deficits.  SKIN:  No rashes or petechiae.  MUSCULOSKELETAL:  There is tenderness over the sternum.   INITIAL LABORATORY DATA:  White cells 11.3, hemoglobin 11.7, hematocrit  34.7, MCV 94.8, platelets 287, neutrophils 80, lymphocytes 15%,  Creatinine  0.8, chloride 108, sodium 141, potassium 3.7, BUN 13.  Bicarbonate 26, pH  7.455.  First set of cardiac markers CK-MB less than 1.0, troponin 0.18,  myoglobin 65.3.  Second set of cardiac markers CK-MB 1.1, troponin I 0.21,  myoglobin 82.8.  D-dimer 0.28.  Fecal occult blood negative.   Chest x-ray showed bronchitic changes.  No infiltrate or evidence of  congestion.   EKG:  Normal sinus rhythm with a heart rate of 81 beats per minute.  No ST  changes.   ASSESSMENT/PLAN:  Vanessa Calderon is a 65 year old African American female with  history of type 2 diabetes mellitus, hypertension, obesity, who presented  with retrosternal chest pressure associated with shortness of breath,  exacerbated by exertion and relieved by rest, also relieved by nitroglycerin on her arrival in the emergency department.  She has a normal sinus rhythm  EKG,elevated troponin but normal  CK-MB and myoglobin.  In addition, she  developed headache associated with photophobia, phonophobia with preference  for dark room and quiet.   1.  Chest pressure, rule out MI.  She has significant risk factors.  Will      start treatment for acute coronary syndrome.  Start heparin  infusion,      Lopressor 12.5 mg p.o. b.i.d., nitroglycerin infusion for ongoing chest      pain, aspirin 325 mg p.o. daily, use morphine p.r.n. for continued chest      pain, obtain fasting lipid profile, BNP.  She may need cardiology      consult.  She received four baby aspirins in ER.  1.  Headaches.  Most likely migraine.  Metoclopramide 10 mg IV q.6h. for 48      hours then p.r.n.  Vicodin 1-2 tablets p.o. q.4h. p.r.n. headache.  2.  Probably bronchitis.  Will treat symptomatically using Tylenol and      guaifenesin.  3.  Hypertension, uncontrolled.  Resume Lotrel 1 p.o. daily.  4.  Diabetes mellitus type 2.  Check hemoglobin A1c.  Resume Glucophage,      Byetta and use sliding scale insulin.  5.  Anxiety/depression.  Continue home dose of Prozac.  6.  Body cramps.  Continue home dose of quinine sulfate.  7.  Tobacco use.  Smoking cessation counseling.  8.  Obesity.  Weight loss counseling.  9.  DVT prophylaxis.  The patient is already on full anticoagulation.      MBB/MEDQ  D:  02/08/2005  T:  02/08/2005  Job:  [id number]   cc:   Merlene Laughter. Renae Gloss, M.D.  [address]  [address]  [place]  [place] [address]  Fax: [phone]

## 2011-02-03 NOTE — Cardiovascular Report (Signed)
Vanessa Calderon, Vanessa Calderon              ACCOUNT NO.:  [id number]   MEDICAL RECORD NO.:  [id number]          PATIENT TYPE:  INP   LOCATION:  2927                         FACILITY:  [facility]   PHYSICIAN:  Lyn Records, M.D.   DATE OF BIRTH:  08-26-1946   DATE OF PROCEDURE:  09/01/2005  DATE OF DISCHARGE:                              CARDIAC CATHETERIZATION   INDICATIONS FOR PROCEDURE:  Unstable angina.   PROCEDURE PERFORMED:  1.  Left heart catheterization.  2.  Coronary angiogram.  3.  Left ventriculography.  4.  Saphenous vein graft bypass angiography.  5.  LIMA angiography.  6.  Stent saphenous vein graft to the ramus intermedius using Cypher DES.  7.  AngioSeal arteriotomy closure.   DESCRIPTION:  Because of a very weak pulse, we had some difficulty entering  the femoral on the right. We did enter with an anterior wall stick. A 6-  French sheath was inserted and then diagnostic coronary angiography and  hemodynamic data were recorded using a 6-French A2 multipurpose catheter.  Left ventricular hand injection, left coronary native angiography, and  saphenous vein graft angiography was performed using this catheter. We used  a 6-French internal mammary catheter for the saphenous vein graft to the  right coronary and the native right coronary as well as the left internal  mammary artery.   We identified by digital angiography the saphenous vein graft to the ramus  to be highly stenosed at the distal anastomosis. She was loaded with 600  milligrams of oral Plavix, given weight-based IV heparin and started on a  double bolus followed by an infusion of Integrilin. We used a 6-French left  bypass graft guide catheter and am Office manager. We then performed  angioplasty using a 2.5 x 15 mm maverick balloon and then deployed and 18 x  3.0 mm Cypher stent. We deployed at 11 atmospheres and two inflations were  performed. Starting at the distal third of the stent and back to the left  proximal margin of the stent, we postdilated with a 3.5 x 12 mm Quantum  balloon to nominal pressures of 12 atmospheres. Two balloon inflations were  performed. TIMI grade III flow was noted. The stenosis was reduced to 0%.   AngioSeal arteriotomy closure was performed without complications.   RESULTS:  1.  Hemodynamic data.      1.  Aortic pressure wall 104/55.      2.  Left ventricular pressure 104/10.  2.  Left ventriculography:  Severe inferobasal hypokinesis is noted. EF of      60%.  3.  Coronary angiography:      1.  Left main coronary widely patent.      2.  Left anterior descending coronary:  The LAD is patent and contains          moderately severe mid-vessel disease up to 70% in the region near          the origin of the septal perforator #1. Distal competitive flow was          noted. The second septal perforator contains 95% stenosis.  3.  Circumflex artery:  Circumflex coronary artery is moderate in size          giving origin to three obtuse marginal branches all of which were          small. No significant obstruction is noted.      4.  Ramus intermedius branch:  The ramus contains a high-grade stenosis          in the proximal third of the vessel.      5.  The first diagonal of the left anterior descending is totally          occluded.      6.  Right Coronary:  The right coronary is occluded distally and          contains a 90% stenosis in the mid-vessel.  4.  Bypass graft angiography:      1.  Saphenous vein graft to the ramus intermedius:  The graft contains a          95% distal anastomoses stenosis.      2.  Saphenous vein graft to the diagonal:  This graft is widely patent.      3.  Saphenous vein graft to the distal right coronary:  This graft is          widely patent.  5.  LIMA to the LAD:  This graft is widely patent.  6.  PCI:  PTCA, stent and post dilatation of the high-grade distal      anastomoses stenosis of the saphenous vein graft to the ramus  was      completed without complications. A 95% stenosis reduced to 0% with TIMI      grade III flow.  7.  AngioSeal arteriotomy closure:  Successful.   CONCLUSION:  1.  Unstable angina secondary to high-grade obstruction in the distal      anastomoses of the saphenous vein graft to the ramus.  2.  Successful percutaneous coronary intervention with Cypher drug-eluting      stent implantation in the saphenous vein graft to the ramus from 95% to      0%.  3.  Left ventricular dysfunction with inferobasal akinesis. Ejection      fraction 60%.  4.  Native vessel coronary disease with total occlusion of the first      diagonal, total occlusion of a large ramus branch, total occlusion of      the right coronary. A 60-70% proximal left anterior descending artery      stenosis was noted as well as a 90% second septal perforator stenosis.  5.  Widely patent left internal mammary artery to the left anterior      descending artery.  6.  Widely patent saphenous vein graft to the diagonal and saphenous vein      graft to the distal right.   PLAN:  Aspirin and Plavix for one year. Discontinue Integrilin in 18 hours.  Discharge September 02, 2005.      Lyn Records, M.D.  Electronically Signed     HWS/MEDQ  D:  09/01/2005  T:  09/02/2005  Job:  [id number]   cc:   Candyce Churn. Allyne Gee, M.D.  Fax: [phone]

## 2011-02-03 NOTE — Consult Note (Signed)
NAMESYNDA, Vanessa Calderon              ACCOUNT NO.:  [id number]   MEDICAL RECORD NO.:  [id number]          PATIENT TYPE:  INP   LOCATION:  2931                         FACILITY:  [facility]   PHYSICIAN:  Di Kindle. Edilia Bo, M.D.DATE OF BIRTH:  27-Jun-1946   DATE OF CONSULTATION:  02/10/2005  DATE OF DISCHARGE:                                   CONSULTATION   REFERRING PHYSICIAN:  Mobolaji B. Corky Downs, M.D.   REASON FOR CONSULTATION:  Greater than 80% left carotid stenosis.   HISTORY OF PRESENT ILLNESS:  This is a pleasant 65 year old woman who was  admitted on Feb 08, 2005 with chest pain.  She underwent a cardiac  catheterization which showed three-vessel coronary artery disease and she is  scheduled for coronary revascularization by Dr. Tyrone Sage on Feb 14, 2005.  As part of a preoperative workup, she had a carotid duplex scan and this  showed a greater than 80% left carotid stenosis with no stenosis on the  right side. Vascular surgery was consulted for further recommendations.   The patient denies any history of stroke, TIAs, expressive or receptive  aphasia, amaurosis fugax, or dizziness.  Of note, she is right-handed.   PAST MEDICAL HISTORY:  Is significant for:  1.  Coronary artery disease.  2.  Hypertension.  3.  Type 2 diabetes (non-insulin-dependent).  4.  History of migraine headaches.  5.  History of anxiety and depression.   She denies any history of hypercholesterolemia, history of previous  myocardial infarction or history of congestive heart failure.   PAST SURGICAL HISTORY:  She is:  1.  Status post hysterectomy.  2.  Status post cholecystectomy.  3.  Status post appendectomy.  4.  Status post laparotomy for small bowel adhesions.   MEDICATIONS AND ALLERGIES:  Documented her history and physical.   FAMILY HISTORY:  There is no history of premature cardiovascular disease  that she is aware of.  Her mother has angina, but she is 46 years old.  Father died of  complications of diabetes.   SOCIAL HISTORY:  She is divorced.  She has 2 daughters who are 58 and 82.  She has an occasional cigarette according to the patient.   REVIEW OF SYSTEMS:  She has not had any chest pain in 2 days.  She has had  no palpitations or arrhythmias.  PULMONARY:  She had no bronchitis, asthma  or wheezing.  VASCULAR:  She has had no claudication, rest pain or  nonhealing ulcers.  She has no history of DVT or phlebitis.  Review of  systems is otherwise documented her admission history and physical.   PHYSICAL EXAMINATION:  VITAL SIGNS:  Temperature 97.3, blood pressure  150/70, heart rate is 80.  HEENT:  There is no cervical lymphadenopathy.  She does have a left carotid  bruit.  LUNGS:  Clear bilaterally to auscultation.  CARDIAC:  She has a regular rate and rhythm.  ABDOMEN:  Soft and nontender.  EXTREMITIES:  She has palpable radial pulses bilaterally.  She has palpable  femoral, popliteal, dorsalis pedis and posterior tibial pulses bilaterally.  NEUROLOGIC:  Exam is  nonfocal.  She has no significant lower extremity  swelling.   ASSESSMENT AND PLAN:  I reviewed her duplex scan which on the left side  shows a peak systolic velocity of 327 cm/sec with an end-diastolic velocity  of 114 cm/sec.  There is no significant stenosis on the right.   This patient has a greater than 80% left carotid stenosis and I think she  would be at increased risk for perioperative stroke at the time of her  coronary artery bypass graft if this is not addressed at the same time.  I  have recommended combined left carotid endarterectomy along with her  coronary artery bypass graft in order to lower her risk of perioperative  stroke and future stroke.  We have discussed the indications for surgery and  the potential complications including, but not limited to, bleeding, stroke  (periprocedural risk 2%), myocardial infarction, nerve injury, or other  unpredictable medical problems.  All  of her questions were answered and she  is agreeable to proceed.  I have explained that it would likely be Dr. Madilyn Fireman  who would be performing her surgery in combination with Dr. Tyrone Sage.  Her  surgery has been scheduled for Feb 14, 2005.  Of note, she is on aspirin.      CSD/MEDQ  D:  02/10/2005  T:  02/11/2005  Job:  [id number]

## 2011-06-19 LAB — BASIC METABOLIC PANEL
Chloride: 107
GFR calc Af Amer: >60
GFR calc non Af Amer: >60
Potassium: 3.8
Sodium: 140

## 2011-06-19 LAB — POCT I-STAT GLUCOSE
Glucose, Bld: 98
Operator id: 190281

## 2011-06-19 LAB — GLUCOSE, CAPILLARY
Glucose-Capillary: 103 — ABNORMAL HIGH
Glucose-Capillary: 115 — ABNORMAL HIGH
Glucose-Capillary: 122 — ABNORMAL HIGH
Glucose-Capillary: 146 — ABNORMAL HIGH
Glucose-Capillary: 147 — ABNORMAL HIGH
Glucose-Capillary: 151 — ABNORMAL HIGH
Glucose-Capillary: 197 — ABNORMAL HIGH
Glucose-Capillary: 91

## 2011-06-19 LAB — CBC
HCT: 40.5
MCHC: 32.3
MCV: 99.8
RBC: 4.06
WBC: 12 — ABNORMAL HIGH

## 2011-06-19 LAB — ABO/RH: ABO/RH(D): O POS

## 2011-06-19 LAB — HEMOGLOBIN A1C: Hgb A1c MFr Bld: 6.8 — ABNORMAL HIGH

## 2011-06-19 LAB — TYPE AND SCREEN: ABO/RH(D): O POS

## 2011-06-20 LAB — GLUCOSE, CAPILLARY
Glucose-Capillary: 116 — ABNORMAL HIGH
Glucose-Capillary: 117 — ABNORMAL HIGH
Glucose-Capillary: 121 — ABNORMAL HIGH
Glucose-Capillary: 70

## 2011-06-28 LAB — COMPREHENSIVE METABOLIC PANEL
Albumin: 3.9
BUN: 12
Calcium: 9.4
Creatinine, Ser: 0.89
Total Protein: 7.6

## 2011-06-28 LAB — CBC
HCT: 36.3
MCHC: 32.7
MCV: 97
Platelets: 359
RDW: 14.5 — ABNORMAL HIGH

## 2011-06-28 LAB — PROTIME-INR
INR: 1
Prothrombin Time: 13.4

## 2011-06-28 LAB — APTT: aPTT: 30

## 2011-12-12 ENCOUNTER — Encounter: Payer: Self-pay | Admitting: Internal Medicine

## 2012-02-23 IMAGING — CR DG HUMERUS 2V *R*
2 series · 2 of 2 positions shown · non-contrast
Comparison: None.

CLINICAL DATA: Fall with right arm pain.

RIGHT HUMERUS - 2+ VIEW

[t humerus ap right]
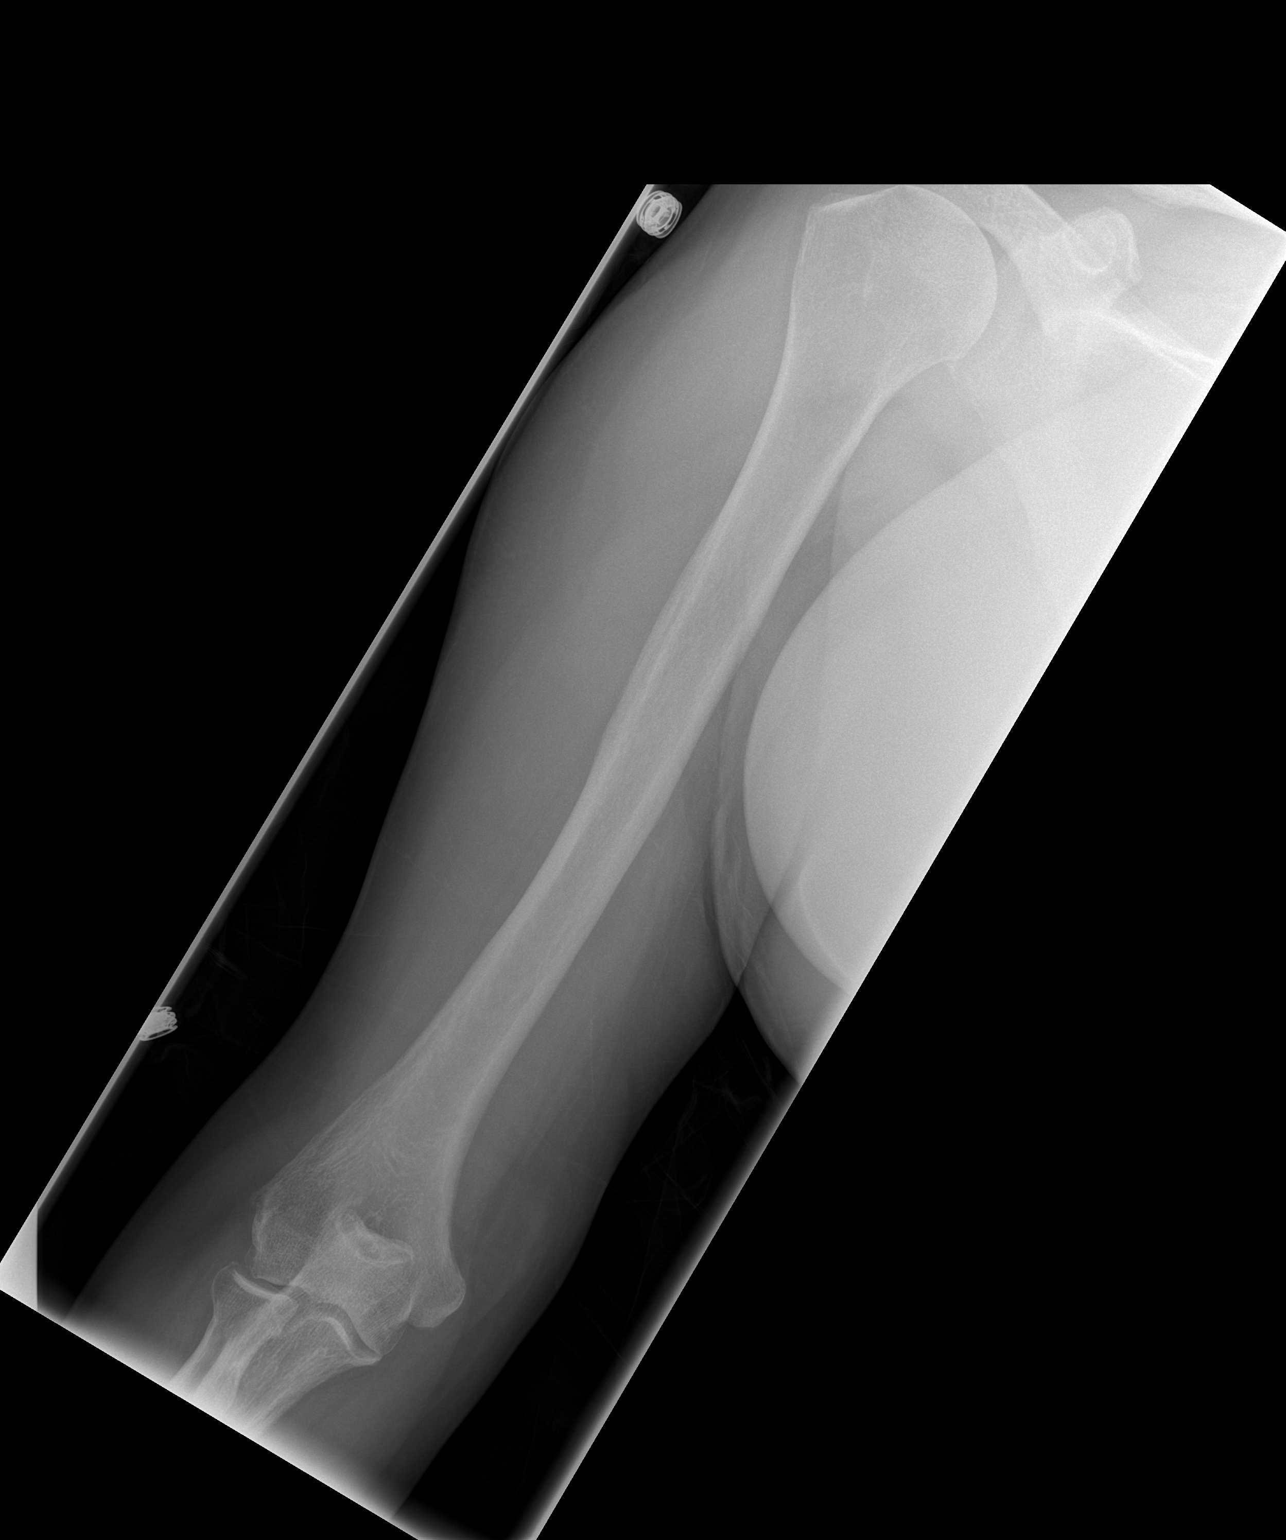

[t humerus lat right]
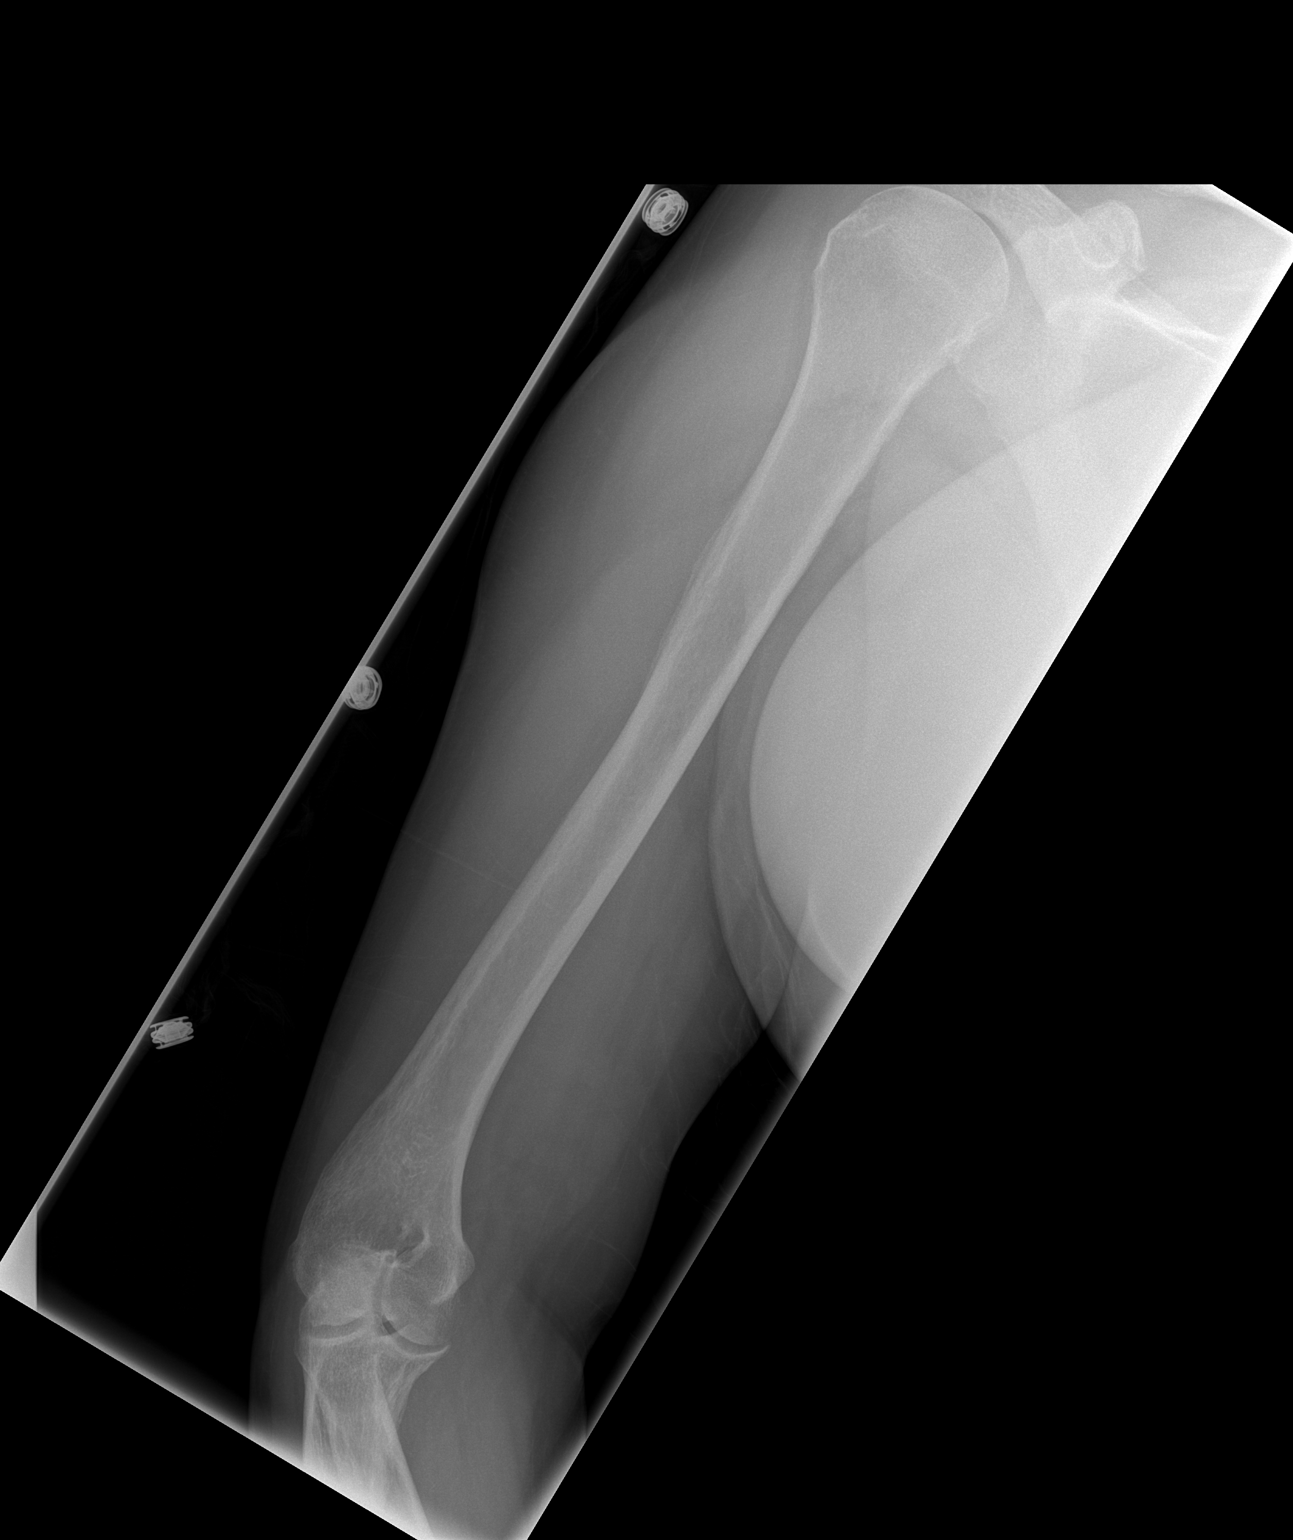

[2 of 2 positions shown; findings below may reference images not displayed]

FINDINGS: There is no evidence of fracture or other focal bone
lesions.  Soft tissues are unremarkable.
IMPRESSION: Negative.

## 2012-02-23 IMAGING — CT CT HEAD W/O CM
1 of 2 series · 13 of 30 positions shown, 17 images · non-contrast
Comparison: 09/15/2009.

CLINICAL DATA: 63-year-old female with fall and right-sided pain.
Headache.

CT HEAD WITHOUT CONTRAST
TECHNIQUE: Contiguous axial images were obtained from the base of
the skull through the vertex without contrast.

[Series 2: brain · axial · 0.47mm/px · z∈[+165,+293]mm · 13 of 28 slices shown, 17 images]
[im 2/28  brain]
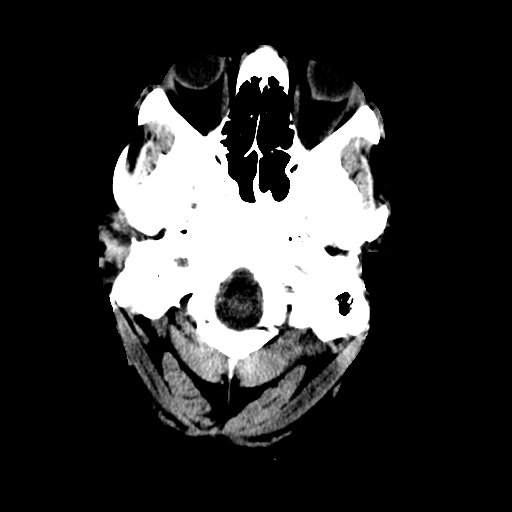
[im 2/28  bone]
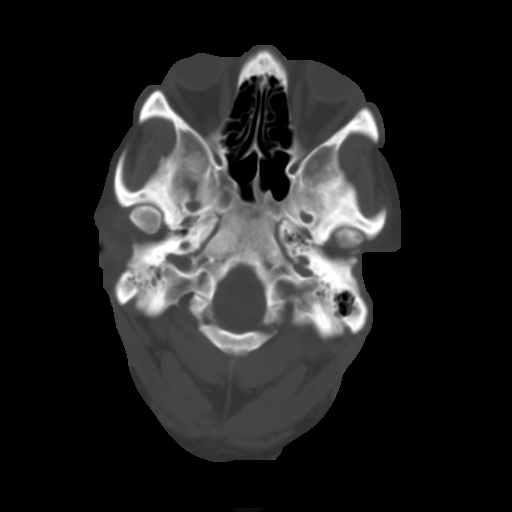
[im 4/28  brain]
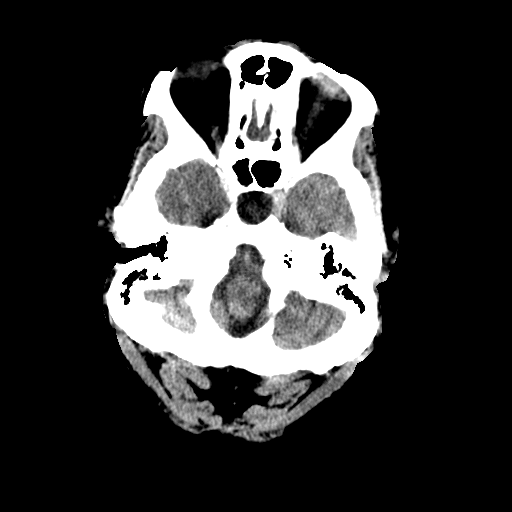
[im 6/28  brain]
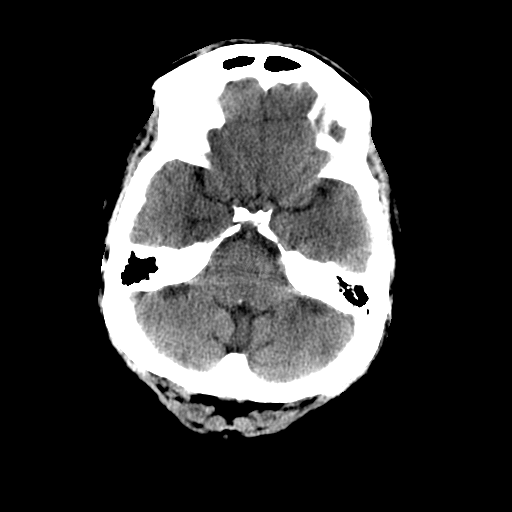
[im 8/28  brain]
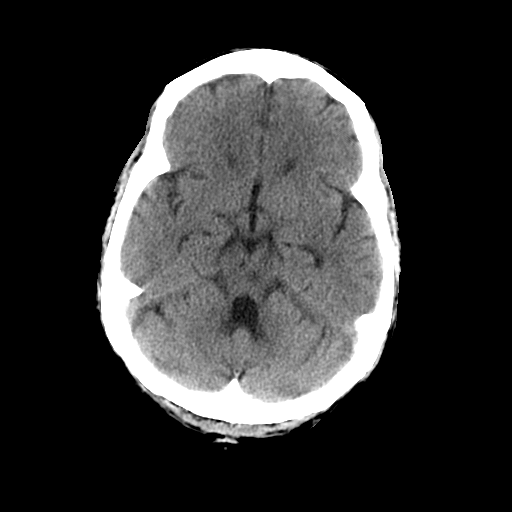
[im 10/28  brain]
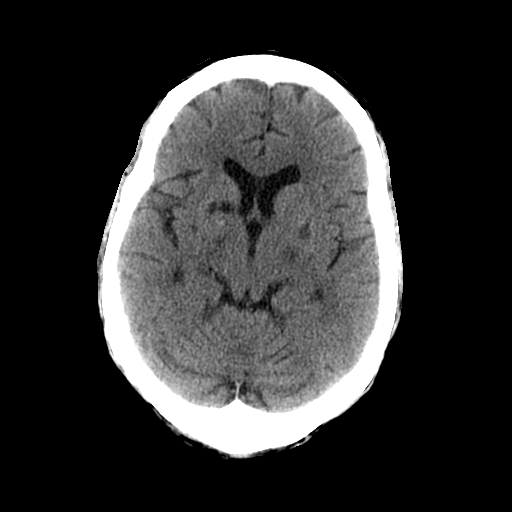
[im 10/28  bone]
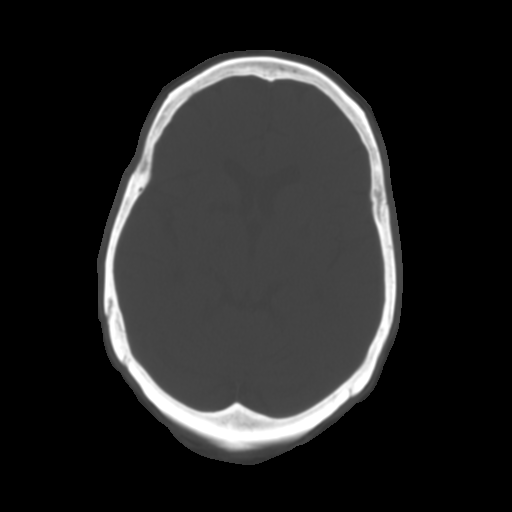
[im 12/28  brain]
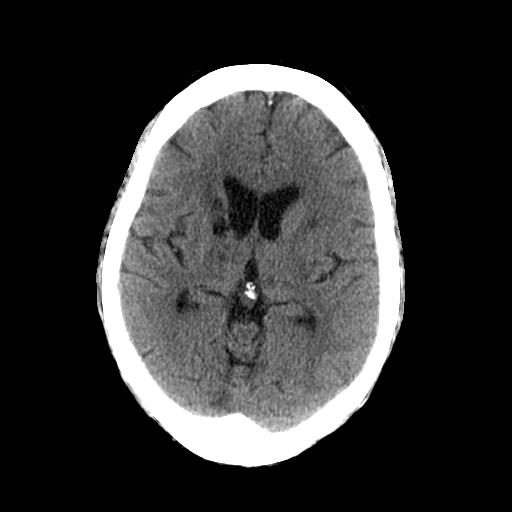
[im 14/28  brain]
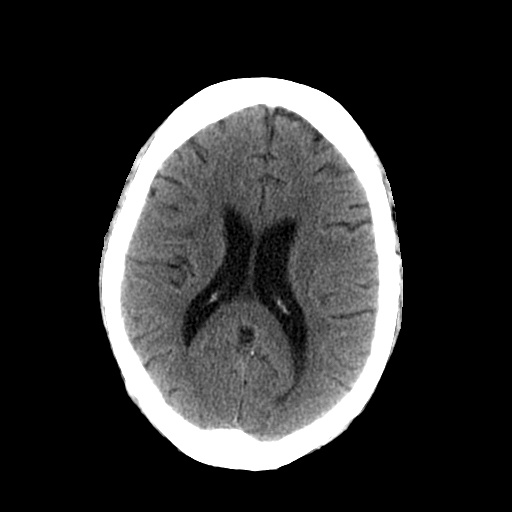
[im 16/28  brain]
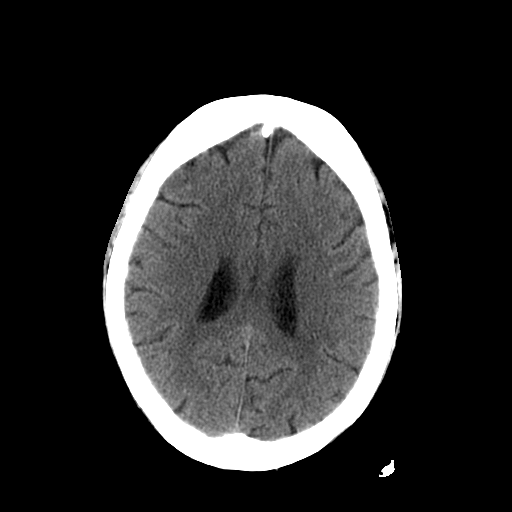
[im 18/28  brain]
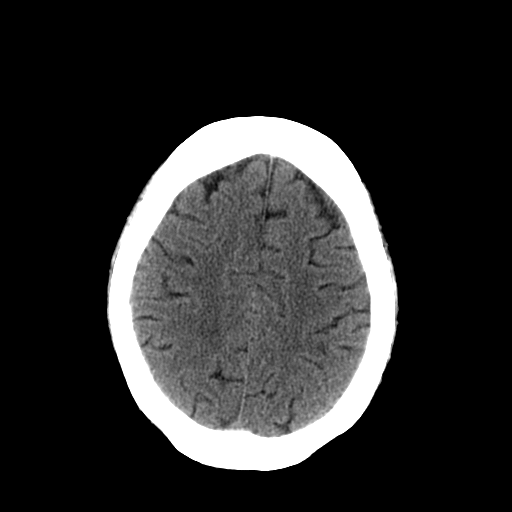
[im 18/28  bone]
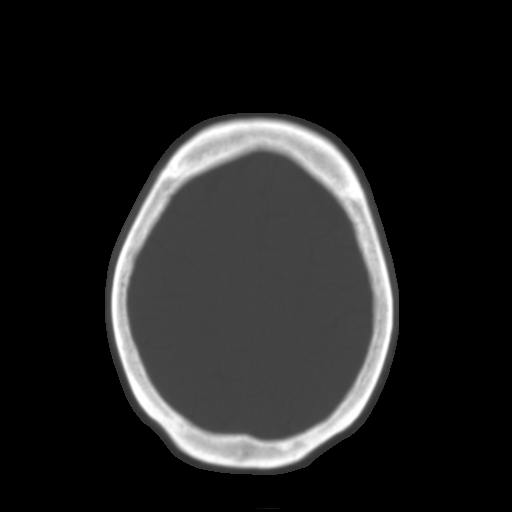
[im 20/28  brain]
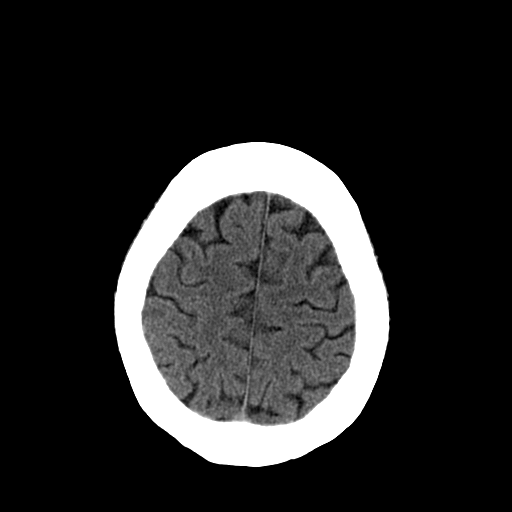
[im 22/28  brain]
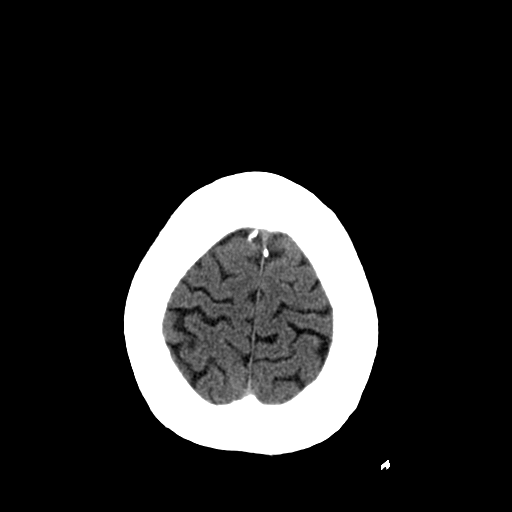
[im 24/28  brain]
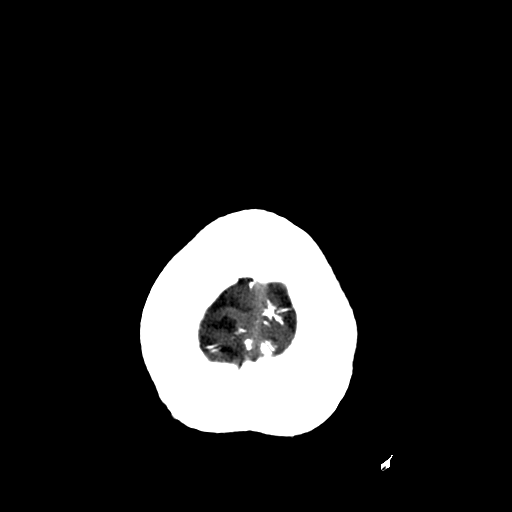
[im 26/28  brain]
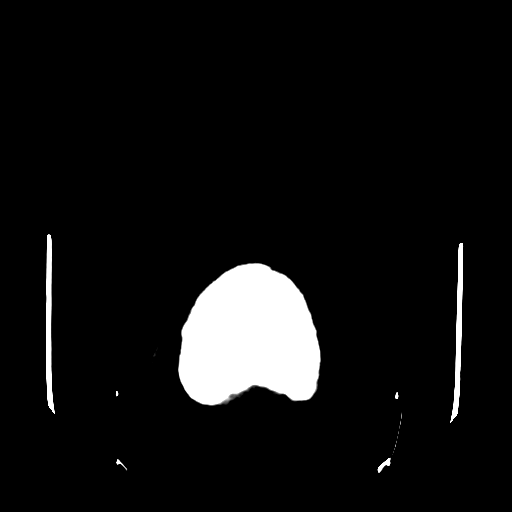
[im 26/28  bone]
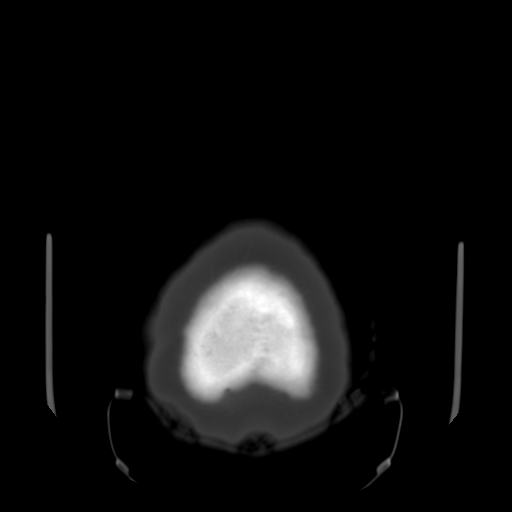

[13 of 30 positions shown; findings below may reference images not displayed]

FINDINGS: Visualized orbits and scalp soft tissues are within
normal limits.  Visualized paranasal sinuses and mastoids are
clear.  No acute osseous abnormality identified.

Multifocal lacunar infarcts in the deep gray matter nuclei and
brain stem. Stable cerebral volume.  Ventricular size and
configuration are within normal limits.  No midline shift, mass
effect, or evidence of mass lesion.  No acute intracranial
hemorrhage identified.  No evidence of cortically based acute
infarction identified.  No suspicious intracranial vascular
hyperdensity.
IMPRESSION: 1. No acute intracranial abnormality.
2.  Numerous chronic brainstem and deep gray matter lacunar
infarcts.

## 2012-03-16 ENCOUNTER — Encounter (HOSPITAL_COMMUNITY): Payer: Self-pay | Admitting: Emergency Medicine

## 2012-03-16 ENCOUNTER — Emergency Department (HOSPITAL_COMMUNITY)
Admission: EM | Admit: 2012-03-16 | Discharge: 2012-03-16 | Disposition: A | Discharge status: Home / Self Care | Payer: Self-pay | Attending: Emergency Medicine | Admitting: Emergency Medicine

## 2012-03-16 ENCOUNTER — Emergency Department (HOSPITAL_COMMUNITY): Payer: Self-pay

## 2012-03-16 DIAGNOSIS — R5383 Other fatigue: Secondary | ICD-10-CM | POA: Insufficient documentation

## 2012-03-16 DIAGNOSIS — F17200 Nicotine dependence, unspecified, uncomplicated: Secondary | ICD-10-CM | POA: Insufficient documentation

## 2012-03-16 DIAGNOSIS — R5381 Other malaise: Secondary | ICD-10-CM | POA: Insufficient documentation

## 2012-03-16 DIAGNOSIS — Z951 Presence of aortocoronary bypass graft: Secondary | ICD-10-CM | POA: Insufficient documentation

## 2012-03-16 DIAGNOSIS — R55 Syncope and collapse: Secondary | ICD-10-CM | POA: Insufficient documentation

## 2012-03-16 DIAGNOSIS — Z8673 Personal history of transient ischemic attack (TIA), and cerebral infarction without residual deficits: Secondary | ICD-10-CM | POA: Insufficient documentation

## 2012-03-16 DIAGNOSIS — J449 Chronic obstructive pulmonary disease, unspecified: Secondary | ICD-10-CM | POA: Insufficient documentation

## 2012-03-16 DIAGNOSIS — R079 Chest pain, unspecified: Secondary | ICD-10-CM | POA: Insufficient documentation

## 2012-03-16 DIAGNOSIS — E119 Type 2 diabetes mellitus without complications: Secondary | ICD-10-CM | POA: Insufficient documentation

## 2012-03-16 DIAGNOSIS — I10 Essential (primary) hypertension: Secondary | ICD-10-CM | POA: Insufficient documentation

## 2012-03-16 DIAGNOSIS — J4489 Other specified chronic obstructive pulmonary disease: Secondary | ICD-10-CM | POA: Insufficient documentation

## 2012-03-16 LAB — BASIC METABOLIC PANEL
BUN: 18 mg/dL (ref 6–23)
CO2: 22 mEq/L (ref 19–32)
Calcium: 9.7 mg/dL (ref 8.4–10.5)
Chloride: 104 mEq/L (ref 96–112)
Creatinine, Ser: 0.83 mg/dL (ref 0.50–1.10)
GFR calc Af Amer: 85 mL/min — ABNORMAL LOW (ref >90)
GFR calc non Af Amer: 73 mL/min — ABNORMAL LOW (ref >90)
Glucose, Bld: 119 mg/dL — ABNORMAL HIGH (ref 70–99)
Potassium: 3.4 mEq/L — ABNORMAL LOW (ref 3.5–5.1)
Sodium: 140 mEq/L (ref 135–145)

## 2012-03-16 LAB — CBC
HCT: 36.5 % (ref 36.0–46.0)
Hemoglobin: 12.5 g/dL (ref 12.0–15.0)
MCH: 32.4 pg (ref 26.0–34.0)
MCHC: 34.2 g/dL (ref 30.0–36.0)
MCV: 94.6 fL (ref 78.0–100.0)
Platelets: 297 10*3/uL (ref 150–400)
RBC: 3.86 MIL/uL — ABNORMAL LOW (ref 3.87–5.11)
RDW: 12.6 % (ref 11.5–15.5)
WBC: 11.5 10*3/uL — ABNORMAL HIGH (ref 4.0–10.5)

## 2012-03-16 LAB — TROPONIN I: Troponin I: <0.3 ng/mL (ref <0.30)

## 2012-03-16 LAB — GLUCOSE, CAPILLARY

## 2012-03-16 MED ORDER — ASPIRIN 81 MG PO CHEW
324.0000 mg | CHEWABLE_TABLET | Freq: Once | ORAL | Status: AC
  Administered 2012-03-16: 324 mg via ORAL
  Filled 2012-03-16: qty 4

## 2012-03-16 NOTE — ED Notes (Signed)
ZOX:[id number]<AV> Expected date:03/16/12<BR> Expected time: 6:00 PM<BR> Means of arrival:Ambulance<BR> Comments:<BR> M100. 65 f. Generalized illness. req eval. 10-12 mins

## 2012-03-16 NOTE — ED Notes (Signed)
Patient given discharge instructions, information, prescriptions, and diet order. Patient states that they adequately understand discharge information given and to return to ED if symptoms return or worsen.     

## 2012-03-16 NOTE — ED Notes (Signed)
Pt ambulated 30 ft to restroom but could not urinate.  Will try later.

## 2012-03-16 NOTE — ED Notes (Signed)
Per EMS: Pt states that after she had her coffee this morning, she began not to feel good.  Hx stroke 6 years ago, CABG, HTN diabetes.  Pt c/o weakness different than what her normal is.  Pt states her right side was affected in her stroke but states that this weakness is more so than her last stroke.  Pt has equal strength in all extremities. PERRLA.  Pt has slurred speech however this is her baseline.  Pt has hx of COPD and has chronic SOB that is no different than normal.  Neuro exam negative (except neuro changes from previous stroke).

## 2012-03-16 NOTE — ED Notes (Signed)
Pt. in red socks and yellow fall-risk bracelet

## 2012-03-21 NOTE — ED Provider Notes (Signed)
History    65yF with CP and generalized weakness. Onset this morning shortly after having coffee. Substernal pressure and burning. Intermittent similar pain since then. Feels generally weak. Mild dyspnea. Nausea. No vomiting. No unusual leg pain or swelling. Hx of prior stroke. No acute deficit.   CSN: [id number]  Arrival date & time 03/16/12  1814   First MD Initiated Contact with Patient 03/16/12 1841      Chief Complaint  Patient presents with  . Weakness    (Consider location/radiation/quality/duration/timing/severity/associated sxs/prior treatment) HPI  Past Medical History  Diagnosis Date  . COPD (chronic obstructive pulmonary disease)   . Stroke   . Hypertension   . Diabetes mellitus     Past Surgical History  Procedure Date  . Coronary artery bypass graft   . Abdominal hysterectomy   . Cholecystectomy   . Back surgery     History reviewed. No pertinent family history.  History  Substance Use Topics  . Smoking status: Current Everyday Smoker -- 1.0 packs/day    Types: Cigarettes  . Smokeless tobacco: Never Used  . Alcohol Use: No    OB History    Grav Para Term Preterm Abortions TAB SAB Ect Mult Living                  Review of Systems  Review of symptoms negative unless otherwise noted in HPI.  Allergies  Codeine; Codeine phosphate; and Penicillins  Home Medications   Current Outpatient Rx  Name Route Sig Dispense Refill  . AMLODIPINE BESYLATE 10 MG PO TABS Oral Take 10 mg by mouth daily.    [name] METFORMIN HCL 500 MG PO TABS Oral Take 500 mg by mouth 2 (two) times daily with a meal.      BP 133/62  Pulse 77  Resp 16  SpO2 100%  Physical Exam  Nursing note and vitals reviewed. Constitutional: She appears well-developed and well-nourished. No distress.       Laying in bed. NAD. Obese.  HENT:  Head: Normocephalic and atraumatic.  Eyes: Conjunctivae are normal. Pupils are equal, round, and reactive to light. Right eye exhibits no discharge.  Left eye exhibits no discharge.  Neck: Neck supple.  Cardiovascular: Normal rate, regular rhythm and normal heart sounds.  Exam reveals no gallop and no friction rub.   No murmur heard. Pulmonary/Chest: Effort normal and breath sounds normal. No respiratory distress. She exhibits no tenderness.  Abdominal: Soft. She exhibits no distension. There is no tenderness.  Musculoskeletal: She exhibits edema. She exhibits no tenderness.       Mild symmetric Le edema. Lower extremities symmetric as compared to each other. No calf tenderness. Negative Homan's. No palpable cords.   Neurological: She is alert. No cranial nerve deficit. She exhibits normal muscle tone. Coordination normal.  Skin: Skin is warm and dry.  Psychiatric: She has a normal mood and affect. Her behavior is normal. Thought content normal.    ED Course  Procedures (including critical care time)  Labs Reviewed  GLUCOSE, CAPILLARY - Abnormal; Notable for the following:    Glucose-Capillary 105 (*)     All other components within normal limits  CBC - Abnormal; Notable for the following:    WBC 11.5 (*)     RBC 3.86 (*)     All other components within normal limits  BASIC METABOLIC PANEL - Abnormal; Notable for the following:    Potassium 3.4 (*)     Glucose, Bld 119 (*)     GFR  calc non Af Amer 73 (*)     GFR calc Af Amer 85 (*)     All other components within normal limits  TROPONIN I  LAB REPORT - SCANNED    Dg Chest 2 View  03/16/2012  *RADIOLOGY REPORT*  Clinical Data: Chest discomfort, history of stroke, coronary artery disease post bypass surgery, smoking, COPD, hypertension, diabetes  CHEST - 2 VIEW  Comparison: 04/06/2010  Findings: Upper normal heart size post CABG. Mediastinal contours and pulmonary vascularity normal. Atelectasis versus infiltrate right base. Peribronchial thickening. Remaining lungs clear. No pleural effusion or pneumothorax. No acute osseous findings.  IMPRESSION: Post CABG. Bronchitic changes  with atelectasis versus infiltrate at right lung base.  Original Report Authenticated By: Lollie Marrow, M.D.   No results found.  EKG:  Rhythm: sinus Rate: 74 Axis: normal Intervals: normal ST segments: NS St changes.  1. Chest pain       MDM  65yF with CP associated with diaphoresis and near syncope. Very concerning and recommended which pt refusing. HAs medical decision making capability and understands risks including potential disability or death. Understands benefit of admission for further testing and evaluation.         Raeford Razor, MD 03/21/12 [phone]

## 2012-03-26 ENCOUNTER — Encounter: Payer: Self-pay | Admitting: Internal Medicine

## 2012-03-26 ENCOUNTER — Ambulatory Visit (INDEPENDENT_AMBULATORY_CARE_PROVIDER_SITE_OTHER): Payer: BC Managed Care – PPO | Admitting: Internal Medicine

## 2012-03-26 VITALS — BP 157/77 | HR 84 | Temp 98.7°F | Wt 182.3 lb

## 2012-03-26 DIAGNOSIS — E663 Overweight: Secondary | ICD-10-CM

## 2012-03-26 DIAGNOSIS — F17200 Nicotine dependence, unspecified, uncomplicated: Secondary | ICD-10-CM

## 2012-03-26 DIAGNOSIS — I10 Essential (primary) hypertension: Secondary | ICD-10-CM

## 2012-03-26 DIAGNOSIS — E785 Hyperlipidemia, unspecified: Secondary | ICD-10-CM

## 2012-03-26 DIAGNOSIS — Z79899 Other long term (current) drug therapy: Secondary | ICD-10-CM

## 2012-03-26 DIAGNOSIS — I6350 Cerebral infarction due to unspecified occlusion or stenosis of unspecified cerebral artery: Secondary | ICD-10-CM

## 2012-03-26 DIAGNOSIS — E119 Type 2 diabetes mellitus without complications: Secondary | ICD-10-CM

## 2012-03-26 DIAGNOSIS — I2510 Atherosclerotic heart disease of native coronary artery without angina pectoris: Secondary | ICD-10-CM

## 2012-03-26 DIAGNOSIS — Z720 Tobacco use: Secondary | ICD-10-CM

## 2012-03-26 DIAGNOSIS — R351 Nocturia: Secondary | ICD-10-CM

## 2012-03-26 DIAGNOSIS — Z0000 Encounter for general adult medical examination without abnormal findings: Secondary | ICD-10-CM

## 2012-03-26 LAB — LIPID PANEL
Cholesterol: 234 mg/dL — ABNORMAL HIGH (ref 0–200)
LDL Cholesterol: 168 mg/dL — ABNORMAL HIGH (ref 0–99)
Total CHOL/HDL Ratio: 5.6 Ratio
Triglycerides: 121 mg/dL (ref <150)
VLDL: 24 mg/dL (ref 0–40)

## 2012-03-26 LAB — COMPLETE METABOLIC PANEL WITH GFR
ALT: 10 U/L (ref 0–35)
AST: 13 U/L (ref 0–37)
Albumin: 4.3 g/dL (ref 3.5–5.2)
Alkaline Phosphatase: 81 U/L (ref 39–117)
BUN: 10 mg/dL (ref 6–23)
Calcium: 9.7 mg/dL (ref 8.4–10.5)
Chloride: 105 mEq/L (ref 96–112)
Creat: 0.85 mg/dL (ref 0.50–1.10)
Potassium: 4.1 mEq/L (ref 3.5–5.3)
Sodium: 141 mEq/L (ref 135–145)
Total Protein: 7.4 g/dL (ref 6.0–8.3)

## 2012-03-26 MED ORDER — LISINOPRIL 10 MG PO TABS: 10.0000 mg | ORAL_TABLET | Freq: Every day | ORAL | Status: DC

## 2012-03-26 MED ORDER — METOPROLOL SUCCINATE 12.5 MG HALF TABLET: 12.5000 mg | ORAL_TABLET | Freq: Every day | ORAL | Status: DC

## 2012-03-26 MED ORDER — METFORMIN HCL 500 MG PO TABS: 500.0000 mg | ORAL_TABLET | Freq: Two times a day (BID) | ORAL | Status: DC

## 2012-03-26 NOTE — Patient Instructions (Signed)
[facility] will deliver your medicine. Please come back to clinic in 2 weeks. Physical therapy will call you to arrange an appointment.

## 2012-03-26 NOTE — Assessment & Plan Note (Signed)
She currently is only on an ASA. She has been out of norvasc her only anti-HTN for two days. She left [place] on ASA, Plavix, statin, and a BB. She stopped taking these 2/2 co-pay issues. I am stopping the norvasc and starting a BB and ACEI. She has no side effects / allergies listed 2/2 these meds. I will not resume the plavix at this time but will cont the ASA.

## 2012-03-26 NOTE — Progress Notes (Signed)
  Subjective:    Patient ID: Vanessa Calderon, female    DOB: Dec 14, 1945, 66 y.o.   MRN: [id number]  HPI  Please see the A&P for the status of the pt's chronic medical problems.   Review of Systems  Constitutional: Positive for unexpected weight change.  HENT: Negative for sore throat, rhinorrhea and postnasal drip.   Eyes: Negative for itching.  Respiratory: Positive for cough. Negative for chest tightness and shortness of breath.   Cardiovascular: Negative for chest pain.  Gastrointestinal: Negative for abdominal pain, diarrhea, constipation and blood in stool.  Genitourinary: Positive for frequency. Negative for dysuria, hematuria, decreased urine volume, vaginal bleeding and vaginal discharge.  Musculoskeletal: Negative for back pain, arthralgias and gait problem.  Neurological: Positive for weakness. Negative for dizziness and headaches.  Psychiatric/Behavioral: Positive for disturbed wake/sleep cycle.       Objective:   Physical Exam  Constitutional: She is oriented to person, place, and time. She appears well-developed and well-nourished. No distress.  HENT:  Head: Normocephalic and atraumatic.  Right Ear: External ear normal.  Left Ear: External ear normal.  Nose: Nose normal.  Eyes: Conjunctivae and EOM are normal. Left eye exhibits no discharge.  Neck: No tracheal deviation present.  Cardiovascular: Normal rate and regular rhythm.  Exam reveals no friction rub.   No murmur heard. Pulmonary/Chest: Effort normal and breath sounds normal.  Musculoskeletal: Normal range of motion. She exhibits no edema and no tenderness.  Lymphadenopathy:    She has no cervical adenopathy.  Neurological: She is alert and oriented to person, place, and time.       Gait is slow but steady. No leaning to one side. Speech is sometimes thick but mostly clear.   Skin: Skin is warm. She is not diaphoretic.  Psychiatric: She has a normal mood and affect. Her behavior is normal. Judgment and  thought content normal.          Assessment & Plan:

## 2012-03-26 NOTE — Assessment & Plan Note (Signed)
She is only on ASA. Her plavix co-pay was too high and I was aware of this even back when she was still living here in [place]. For now, will cont ASA and will investigate more into the dual tx. She feels that she can't keep her balance and was using a 4 pronged cane in [place] but it apparently didn't;t make the move. I am referring her to PT for gait training and cane / walker eval. She requested an electric WC but I told her I doubted her insurance company would pay and that I didn't want her using one as it would progress her weakness / instability even faster. I also told her NOT to wear flip-flops as that will impair her stability.

## 2012-03-27 ENCOUNTER — Encounter: Payer: Self-pay | Admitting: Internal Medicine

## 2012-03-27 DIAGNOSIS — R32 Unspecified urinary incontinence: Secondary | ICD-10-CM | POA: Insufficient documentation

## 2012-03-27 DIAGNOSIS — Z720 Tobacco use: Secondary | ICD-10-CM

## 2012-03-27 DIAGNOSIS — E663 Overweight: Secondary | ICD-10-CM

## 2012-03-27 DIAGNOSIS — Z0000 Encounter for general adult medical examination without abnormal findings: Secondary | ICD-10-CM | POA: Insufficient documentation

## 2012-03-27 HISTORY — DX: Tobacco use: Z72.0

## 2012-03-27 HISTORY — DX: Overweight: E66.3

## 2012-03-27 MED ORDER — SIMVASTATIN 40 MG PO TABS: 40.0000 mg | ORAL_TABLET | Freq: Every evening | ORAL | Status: DC

## 2012-03-27 NOTE — Assessment & Plan Note (Addendum)
No longer taking a statin. LDL is much too high for her since she has DM, CAD, and CVA. Has been on simvastatin 40 in past with good control. She needs at least a 40% reduction which simva 40 likely will achieve. Therefore will resume simva 40.   I called pt on the 10th at 1430 to inform her of blood results and need for statin and med waiting at [facility].

## 2012-03-27 NOTE — Assessment & Plan Note (Signed)
Pt has to urinate 4-5 times nightly. There is no increased freq during the day. There is urgency and occasional incontinence at night. She has no dysuria, vaginal D/C, no hematuria, vaginal bleeding.   I wanted to R/O an infxn first but pt was unable to give urine sample so will try again next visit. I will also need to review fluid intake in the hours prior to bed. If nosig fluid intake and no UTI, could try alpha blocker or Ditropan although effects seem modest. dDAVP as last resort due to her age and co-morbidities.

## 2012-03-27 NOTE — Assessment & Plan Note (Signed)
She has not run out of her metformin. A1C is great. We will obtain her last optho exam. I requested a urine sample to get microalb but pt was unable to provide sample today. I was starting an ACEI anyway.

## 2012-03-27 NOTE — Assessment & Plan Note (Addendum)
Will need to cont to work on this. This was her first visit for 2 years or so so there were other more important issues today.   Pt does not have car so relying on friends to take her to appts / stores. We informed her that [facility] would deliver her meds to her apartment.

## 2012-03-27 NOTE — Assessment & Plan Note (Signed)
She had stopped her BB and ACEI on her own while she was in [place]. She has been out of her Norvasc 2 days. Her BP is elevated. Will D/C Norvasc and add BB and ACEI as she is CAD and DM. May need to add diuretic if remains elevated. BMP today and recheck BMP at F/U appt.

## 2012-03-27 NOTE — Assessment & Plan Note (Signed)
I told pt that smoking was harmful and when she was ready to discuss smoking cessation to let me know as there is assistance that I can provide. She has tried Chantix in past with intolerable side effects. Never tried nicotine replacement.

## 2012-04-09 ENCOUNTER — Ambulatory Visit (INDEPENDENT_AMBULATORY_CARE_PROVIDER_SITE_OTHER): Payer: BC Managed Care – PPO | Admitting: Internal Medicine

## 2012-04-09 ENCOUNTER — Encounter: Payer: Self-pay | Admitting: Internal Medicine

## 2012-04-09 VITALS — BP 115/68 | HR 72 | Temp 98.4°F | Ht 67.0 in | Wt 184.2 lb

## 2012-04-09 DIAGNOSIS — Z8673 Personal history of transient ischemic attack (TIA), and cerebral infarction without residual deficits: Secondary | ICD-10-CM

## 2012-04-09 DIAGNOSIS — Z0000 Encounter for general adult medical examination without abnormal findings: Secondary | ICD-10-CM

## 2012-04-09 DIAGNOSIS — F17200 Nicotine dependence, unspecified, uncomplicated: Secondary | ICD-10-CM

## 2012-04-09 DIAGNOSIS — E119 Type 2 diabetes mellitus without complications: Secondary | ICD-10-CM

## 2012-04-09 DIAGNOSIS — R351 Nocturia: Secondary | ICD-10-CM

## 2012-04-09 DIAGNOSIS — R05 Cough: Secondary | ICD-10-CM

## 2012-04-09 DIAGNOSIS — R29898 Other symptoms and signs involving the musculoskeletal system: Secondary | ICD-10-CM

## 2012-04-09 DIAGNOSIS — Z720 Tobacco use: Secondary | ICD-10-CM

## 2012-04-09 DIAGNOSIS — R32 Unspecified urinary incontinence: Secondary | ICD-10-CM

## 2012-04-09 DIAGNOSIS — Z7409 Other reduced mobility: Secondary | ICD-10-CM

## 2012-04-09 DIAGNOSIS — E785 Hyperlipidemia, unspecified: Secondary | ICD-10-CM

## 2012-04-09 DIAGNOSIS — I10 Essential (primary) hypertension: Secondary | ICD-10-CM

## 2012-04-09 DIAGNOSIS — Z23 Encounter for immunization: Secondary | ICD-10-CM

## 2012-04-09 DIAGNOSIS — N3941 Urge incontinence: Secondary | ICD-10-CM

## 2012-04-09 DIAGNOSIS — G8191 Hemiplegia, unspecified affecting right dominant side: Secondary | ICD-10-CM

## 2012-04-09 LAB — GLUCOSE, CAPILLARY: Glucose-Capillary: 87 mg/dL (ref 70–99)

## 2012-04-09 MED ORDER — CETIRIZINE HCL 10 MG PO CAPS: 10.0000 mg | ORAL_CAPSULE | Freq: Every day | ORAL | Status: DC | PRN

## 2012-04-09 NOTE — Assessment & Plan Note (Addendum)
Well controlled 03/26/12 HA1C 6.0 pt taking glucophage 1000 mg bid  Per pt she had eye exam 6 mos ago in [place]

## 2012-04-09 NOTE — Assessment & Plan Note (Addendum)
Lipid Panel     Component Value Date/Time   CHOL 234* 03/26/2012 1110   TRIG 121 03/26/2012 1110   HDL 42 03/26/2012 1110   CHOLHDL 5.6 03/26/2012 1110   VLDL 24 03/26/2012 1110   LDLCALC 168* 03/26/2012 1110   Previous LDL before 03/26/12 visit with LDL at goal Pt has not been taking a statin With repeat lipid panel LDL elevated (goal <100) statin restarted Zocor 40 mg qhs Repeat lipids 6 mos

## 2012-04-09 NOTE — Assessment & Plan Note (Signed)
Controlled today 115/68 Cont.  Lisinopril10 mg qd, Toprol XL 12.5 qd

## 2012-04-09 NOTE — Assessment & Plan Note (Signed)
Pt has residual right arm and leg hemiparesis   Ordered home health to assess needs in particular for PT-pt doesn't have transportation and needs home PT  social worker consulted and spoke with pt today

## 2012-04-09 NOTE — Patient Instructions (Addendum)
Please pick up your new medication from the pharmacy for allergies/runny nose See Dr. Rogelia Boga in 1 month Make sure you hear from physical therapy and home health

## 2012-04-09 NOTE — Progress Notes (Signed)
Subjective:    Patient ID: Vanessa Calderon, female    DOB: 1946/05/17, 66 y.o.   MRN: [id number]  HPI Comments: 66 y.o woman PMH controlled DM 2, HLD, HTN, CAD s/p CABG, CVA (with residual right arm and leg weakness (baseline walks with a cane or walker)).  Pt presents for 2 week f/u for previously noted nocturia at 03/27/12 appt with Dr. Rogelia Boga.  At that time pt could not give a urine specimen and per last note UA should be obtained today.  Pt denies dysuria, vag d/c, vag bleeding, vag odor.  She states she is urinating freq at night and at times has urgency when urinating.  She states at times she rushes to the restroom and does not make it in time.   Pt reports physical therapy has not contacted her yet.  SH: Pt states she lives alone in an apt. She needs help with preparing meals and eats a lot of processed foods/microwaveable dinners because she needs help with preparing meals.  She recently moved back to [place] with only friends in the area to get away from her only child (daughter) who she could not get along with due to her daughter being bossy.  Pt relies on friend for transportation and does not have a car.  She states she still keeps in contact with her daughter but her daughter lives in [place].  This is were the pt was born and reared.  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  Pt reports smoking cigarettes and is not ready to quit.       Review of Systems  Constitutional: Negative for fever.  HENT: Positive for rhinorrhea.   Respiratory: Positive for cough. Negative for shortness of breath.        +chronic cough since CVA ~3 years ago with productive clear to white sputum  Cardiovascular: Positive for leg swelling. Negative for chest pain.       H/o chronic right leg swelling  Gastrointestinal:       Denies constipation/diarrhea, Denies ab pain  Genitourinary: Positive for frequency. Negative for dysuria, hematuria, vaginal bleeding and vaginal discharge.       Denies  vaginal odor  Musculoskeletal: Positive for gait problem.  Neurological: Positive for weakness.       Right arm/leg weak after CVA       Objective:   Physical Exam  Nursing note and vitals reviewed. Constitutional: She is oriented to person, place, and time. Vital signs are normal. She appears well-developed and well-nourished. She is cooperative.  HENT:  Head: Normocephalic and atraumatic.  Mouth/Throat: Oropharynx is clear and moist and mucous membranes are normal.  Eyes: Conjunctivae are normal. Pupils are equal, round, and reactive to light. No scleral icterus.  Cardiovascular: Normal rate, regular rhythm, S1 normal, S2 normal and normal heart sounds.   No murmur heard. Pulmonary/Chest: Effort normal and breath sounds normal. No respiratory distress. She has no wheezes.    Abdominal: Soft. Bowel sounds are normal. She exhibits no distension. There is no tenderness.  Neurological: She is alert and oriented to person, place, and time. Gait abnormal.       4/5 strength right arm and leg  5/5 strength left arm and leg  No sensory change noted V1-V3 b/l face and upper arms b/l   Skin: Skin is warm, dry and intact.  Psychiatric: She has a normal mood and affect.       Pt's speech is slowed but seems to comprehend and answer questions  appropriately          Assessment & Plan:  1 month (f/u urge incontinence, ask if [facility] assessed for PT, ask if getting help with preparing meals, ask about cough)

## 2012-04-10 ENCOUNTER — Encounter: Payer: Self-pay | Admitting: Licensed Clinical Social Worker

## 2012-04-10 LAB — URINALYSIS, ROUTINE W REFLEX MICROSCOPIC
Glucose, UA: NEGATIVE mg/dL
Hgb urine dipstick: NEGATIVE
Ketones, ur: NEGATIVE mg/dL
Nitrite: POSITIVE — AB
Specific Gravity, Urine: 1.027 (ref 1.005–1.030)
pH: 5.5 (ref 5.0–8.0)

## 2012-04-10 LAB — URINALYSIS, MICROSCOPIC ONLY: Casts: NONE SEEN

## 2012-04-10 NOTE — Progress Notes (Signed)
I saw patient and discussed her care with resident Dr. McLean.  I agree with the clinical findings and plans as outlined in her note. 

## 2012-04-10 NOTE — Assessment & Plan Note (Addendum)
Pt is not ready to quit Counseled on benefits of cessation

## 2012-04-10 NOTE — Assessment & Plan Note (Signed)
Per pt unknown trigger associated with rhinorrhea Per pt cough started after CVA and has been chronic but no assoc. With Lisinopril  03/16/12 CXR-Post CABG Bronchitic changes with atelectasis versus infiltrate at right lung base (reviewed)  Etiology could be related to postnasal drip  Plan Rx Zyrtec to try If Zyrtec is not helping consider other etiologies of cough i.e obstructive lung (will need pfts), reflux, changing ACEI-->ARB

## 2012-04-10 NOTE — Assessment & Plan Note (Addendum)
Noted by pt x 1 mo  Pt has risk factor of h/o CVA as cause Will obtain UA today. Pt voided 60 cc  Plan Consider medical vs nonmedical tx at f/u in 1 month

## 2012-04-10 NOTE — Progress Notes (Signed)
CSW met with Vanessa Calderon following her 04/09/12 [facility] appt.  Pt was referred to CSW for community services and home health services.  Vanessa Calderon states she is having difficulty preparing her home meals and is in need of assistance.  Discussed meals on wheels program and the option of private duty care givers.  Pt in agreement for referral to [facility] for MOW referral, pt aware currently waitlist.  CSW has requested a listing of private duty caregivers from [employer].  CSW will mail to Vanessa Calderon when received.  Referral for MOW completed.  Vanessa Calderon also has a referral for [facility] services, pt has no preference for agency and in agreement to use [facility].  Referral faxed to [facility].

## 2012-04-10 NOTE — Assessment & Plan Note (Signed)
Baseline pt walks with cane/walker Pt does not work Pt having trouble preparing meals at home Placed order for social work to help pt or give resources for pt to get meals prepared

## 2012-04-10 NOTE — Assessment & Plan Note (Signed)
Obtained UA today

## 2012-04-10 NOTE — Assessment & Plan Note (Signed)
Pt declines tetanus Given pneumococcal vx today Pt declines referral for colonoscopy and has never had a colonoscopy  -informed pt this is age appropriate screening

## 2012-04-12 ENCOUNTER — Telehealth: Payer: Self-pay | Admitting: Licensed Clinical Social Worker

## 2012-04-12 NOTE — Telephone Encounter (Signed)
CSW received call from Jimmie Molly with [facility].  Pt's insurance is requesting additional information.  CSW awaiting return call from [place] to obtain information [employer] needs

## 2012-04-16 NOTE — Telephone Encounter (Signed)
CSW left message with Clydie Braun at [facility], providing pt's dates of service.

## 2012-04-19 ENCOUNTER — Telehealth: Payer: Self-pay | Admitting: *Deleted

## 2012-04-19 DIAGNOSIS — I10 Essential (primary) hypertension: Secondary | ICD-10-CM

## 2012-04-19 DIAGNOSIS — I2510 Atherosclerotic heart disease of native coronary artery without angina pectoris: Secondary | ICD-10-CM

## 2012-04-19 NOTE — Telephone Encounter (Signed)
[facility] calls to say pt has been taking 1 metoprolol instead of 1/2, also would you like for [facility] to teach pt to use glucometer, pt has not been checking home blood sugars, if so they would need a script for the meter. bp 118/72 hr 87 r 16 o2sat 97%.  Contact susan young [phone]               Ena Dawley ext [phone]  She also states that pt has limited income and may be limiting her food, i am sending this to shana also She states pt smokes 1 ppd  Please advise on metoprolol and glucometer

## 2012-04-22 MED ORDER — METOPROLOL SUCCINATE 12.5 MG HALF TABLET: 25.0000 mg | ORAL_TABLET | Freq: Every day | ORAL | Status: DC

## 2012-04-22 MED ORDER — FREESTYLE SYSTEM KIT: PACK | Status: DC

## 2012-04-22 NOTE — Telephone Encounter (Addendum)
Ok to cont full pill or metoprolol as long as pt isn;t dizzy or lightheaded. I will update her med list.  Pls ask [facility] if pt picked up that statin that I sent electronically after her last appt with me.  Will send in rx for glucometer. Teaching to use glucometer appreciated. Since pt is only on orals and very well controlled, only need to check periodically, up to QD or when feels hypoglycemic.

## 2012-04-25 NOTE — Addendum Note (Signed)
Addended by: Neomia Dear on: 04/25/2012 04:12 PM   Modules accepted: Orders

## 2012-05-10 ENCOUNTER — Ambulatory Visit (INDEPENDENT_AMBULATORY_CARE_PROVIDER_SITE_OTHER): Payer: BC Managed Care – PPO | Admitting: Internal Medicine

## 2012-05-10 ENCOUNTER — Encounter: Payer: Self-pay | Admitting: Internal Medicine

## 2012-05-10 ENCOUNTER — Telehealth: Payer: Self-pay | Admitting: Internal Medicine

## 2012-05-10 VITALS — BP 133/76 | HR 80 | Temp 99.4°F | Ht 67.0 in | Wt 184.3 lb

## 2012-05-10 DIAGNOSIS — R300 Dysuria: Secondary | ICD-10-CM

## 2012-05-10 DIAGNOSIS — F0280 Dementia in other diseases classified elsewhere without behavioral disturbance: Secondary | ICD-10-CM

## 2012-05-10 DIAGNOSIS — F329 Major depressive disorder, single episode, unspecified: Secondary | ICD-10-CM

## 2012-05-10 LAB — POCT URINALYSIS DIPSTICK
Blood, UA: NEGATIVE
Ketones, UA: NEGATIVE
Spec Grav, UA: >=1.03
Urobilinogen, UA: 1
pH, UA: 5.5

## 2012-05-10 LAB — GLUCOSE, CAPILLARY: Glucose-Capillary: 89 mg/dL (ref 70–99)

## 2012-05-10 NOTE — Progress Notes (Signed)
Subjective:   Patient ID: Vanessa Calderon female   DOB: 10-26-1945 66 y.o.   MRN: [id number]  HPI: Vanessa Calderon is a 66 y.o. 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 She states this has been going on for about a month without any pyuria, abdominal pain, or fever/chills. She has also noticed some polyuria and polydipsia for the past couple of weeks. She gets extra "sweaty" and dizzy/shaky at night that is relieved after eating some peanut butter. She said that over the past month some of her blood sugars have been in the 60s to 80s during these episodes. She states it has become difficult to perform her ADLs; she gets confused at home especially in terms of dressing herself. She states that it is sometimes difficult to dress herself or even remember to/if she got dressed. Per a friend that lives next door and has communication with the patient she states that her home is in disarray and she needs help cleaning and no one visits her. The friend is unable to help because of her back pain and chronic knee pain. This friend also knows the patient's finances. The patient also reports increased crying spells at home, decreased appetite, insomnia with frequent awakenings, feelings of guilt and hopeless about her recovery from her stroke, and has fleeting thoughts of suicidal ideation with no active plan today. She lives alone and has no contact with family. She does state that even when she is hungry she doesn't eat but still think she takes her medications. Upon further probing the patient says she even forgets to eat or is not motivated to eat. This doesn't appear to be part of a suicide plan. Pt states that she continues to smoke to "calm her nerves that make her scared about her stroke and about everything in life." There are no other  complaints today.     Past Medical History  Diagnosis Date  . CORONARY ARTERY DISEASE 05/15/2008    CABG 2006, DES to the saphenous vein graft to the ramus in December of 2006.  ECHO 2010 : basal inferior wall akinesis. Post-op septal wall dyssynchrony. Grade 1diastolic dysfunction.       [name] DIABETES MELLITUS, TYPE II 05/15/2008    Well controlled on oral meds.    [name] HYPERLIPIDEMIA 05/11/2008    Mgmt with statin    . HYPERTENSION 05/15/2008    Needs BB 2/2 CAD and ACEI 2/2 DM.     [name] Overweight (BMI 25.0-29.9) 03/27/2012  . Tobacco use 03/27/2012    She has tried Chantix in past with intolerable side effects.   [name] CVA 09/23/2009    Left CEA May 2006 at same time as CABG 2010 : Acute left hemisphere infarct of posterior limb internal capsule and lateral thalamus.  Prior CVA's but dates unknown, imaging shows widespread multifocal remote lacunar infarcts throughout the brain and brainstem.  MRA 2010 : no severe proximal flow limiting stenosis.    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 amLODipine (NORVASC) 10 MG tablet Take 10 mg by mouth daily.      [name] aspirin 325 MG tablet Take 325 mg by mouth daily.      . Cetirizine HCl (ZYRTEC ALLERGY) 10 MG CAPS Take 1 capsule (10 mg total) by mouth daily as needed.  30 capsule  3  . glucose monitoring kit (FREESTYLE) monitoring kit Use to check sugar up to once a day or when you feel your sugar might be low. May substitute any meter system her insurance covers  1 each  0  . lisinopril (PRINIVIL,ZESTRIL) 10 MG tablet Take 1 tablet (10 mg total) by mouth daily.  30 tablet  1  . metFORMIN (GLUCOPHAGE) 500 MG tablet Take 1 tablet (500 mg total) by mouth 2 (two) times daily with a meal.  60 tablet  5  . metoprolol succinate (TOPROL-XL) 12.5 mg TB24 Take 1 tablet (25 mg total) by mouth daily.  30 tablet  1  . simvastatin (ZOCOR) 40 MG tablet Take 1 tablet (40 mg total) by mouth every  evening.  30 tablet  5   No family history on file. History   Social History  . Marital Status: Single    Spouse Name: N/A    Number of Children: N/A  . Years of Education: N/A   Social History Main Topics  . Smoking status: Current Everyday Smoker -- 0.5 packs/day    Types: Cigarettes  . Smokeless tobacco: Current User   Comment: will let doctor know  . Alcohol Use: No  . Drug Use: No  . Sexually Active: None   Other Topics Concern  . None   Social History Narrative   Pt grew up in [place]. Has daughter in [place]. Divorced. Lived in [place] about 2010 but then returned to [place] for a couple of yrs and returned to [place] in 2013. No family here but has friends. Some gait instability since CVA but no freq falls. Decreased vision in L eye. Has license but no car. Lives in apartment independently.    Review of Systems: Pertinent listed in the history of present illness Objective:  Physical Exam: Filed Vitals:   05/10/12 1058  BP: 133/76  Pulse: 80  Temp: 99.4 F (37.4 C)  TempSrc: Oral  Height: 5\' 7"  (1.702 m)  Weight: 184 lb 4.8 oz (83.598 kg)  SpO2: 95%   General: NAD, well nourished, some emotional distress HEENT: PERRL, EOMI, no scleral icterus Cardiac: RRR, no rubs, murmurs or gallops Pulm: Expiratory wheezes cleared with coughing, moving normal volumes of air Abd: soft, nontender, nondistended, BS present Ext: warm and well perfused, no pedal edema Neuro: alert and waxing and waning orientation but always oriented to self, cranial nerves II-XII grossly intact, slow parkinsonian gait  Assessment & Plan:  1.Depression symptoms of dementia: Patient does seem to have impairment of handling complex tasks such as cleaning and it is unclear of her reasoning ability, she does per record review seemed to have some exhibited decline from previous levels of functioning. She does report depressive mood and fleeting suicidal ideation with no active plan. During the  interview she had waxing and waning orientation but was always oriented to self. She has limited support network with only a friend that lives next door and frequently visits her per her report she states that the patient's home is in disarray. Pt left clinic before the interview was over fixating on needing to get home. Pt states smokes to "calm the nervous feelings of her stroke." -Adult protective services was called -Homehealth care RN and SW -Psychiatry referral for evaluation of competency and dementia/depression -didn't begin antidepressant today  -pts UA was very concentrated maybe 2/2 to pt not drinking as well or forgetting to drink -pts BG was 89 despite pt stating she had something to eat  right before the visit -pt was told to stop her metformin 2/2 pts reported hypoglycemic symptoms and BG readings as low at 60 at home during symptoms -in terms of pts urinary complaints it doesn't seem to be 2/2 UTI and will f/u on UA and UCx -given pts possible dementia didn't want to give anticholenergic at this time -next of kin is unknown and contact person number doesn't exist therefore called guardianship -friend agreed to stay with patient until next appointment and mobile crisis management crisis line was given Vesta Mixer information for other counseling was also given -close f/u with patient at this time until placement and safety can be fully assed -f/u in 1 week  Pt was seen and evaluated by Dr. Kem Kays

## 2012-05-10 NOTE — Patient Instructions (Signed)
We hope that if you notice any changes in your mood or if he gets worse that you call the crisis line number right away. We will have home health come out as soon as possible to assess how you're doing at home.  Because of your low blood sugars please stop taking your metformin from today.  If you have any questions or concerns please do not hesitate to call the clinic.

## 2012-05-10 NOTE — Telephone Encounter (Signed)
Patient called back to the clinic later on in the afternoon after visit to discuss the outcomes of the visit earlier today. She asked whether medications for her depression were started. Patient was informed that we are not comfortable giving her medications for her depression at this time and that she had left before the visit was completed. Patient did not report being suicidal at this time. Patient was informed of her followup appointment in 1 week and that home health care would be coming out soon to see how she's doing. Patient verbally confirmed this plan and understanding.

## 2012-05-10 NOTE — Progress Notes (Signed)
CSW placed call to pt's primary contact: Ruthe Rivers.  Phone number for Ms. Anda Kraft is out of service.  CSW placed call to Adult Protective Services for referral, provided Dr. Caleen Jobs phone number as contact for today.  [facility] does not have contract with [employer].

## 2012-05-11 LAB — URINALYSIS, MICROSCOPIC ONLY
Casts: NONE SEEN
Crystals: NONE SEEN

## 2012-05-11 LAB — URINALYSIS, ROUTINE W REFLEX MICROSCOPIC
Hgb urine dipstick: NEGATIVE
Ketones, ur: NEGATIVE mg/dL
Nitrite: POSITIVE — AB
Protein, ur: NEGATIVE mg/dL
Specific Gravity, Urine: 1.017 (ref 1.005–1.030)
Urobilinogen, UA: 1 mg/dL (ref 0.0–1.0)

## 2012-05-12 NOTE — Progress Notes (Signed)
INTERNAL MEDICINE TEACHING ATTENDING ADDENDUM - Jonah Blue, DO: I personally saw and evaluated Vanessa Calderon in this clinic visit in conjunction with the resident, Dr. Christen Bame. I have discussed patient's plan of care with medical resident during this visit. I have confirmed the physical exam findings and have read and agree with the clinic note including the plan with the following addition: Mrs. Vanessa Calderon needs evaluation by APS to determine if her living conditions are safe. She appears to be alert, oriented to person, place, and time on my exam, but this seems to vary from her initial exam by the resident. She may have underlying dementia vs. Delirium vs. Component of both.  I would send a UA and UC and treat for UTI if there is supporting evidence of infection, given her questionable mental status.  She has expressed suicidal ideation but no plan.  She may also have episodes of hypoglycemia, I would discontinue her metformin and re-evaluate.  SW has been consulted. See back in clinic next week.

## 2012-05-14 ENCOUNTER — Telehealth: Payer: Self-pay | Admitting: Licensed Clinical Social Worker

## 2012-05-14 NOTE — Telephone Encounter (Signed)
Noted, agreed

## 2012-05-14 NOTE — Telephone Encounter (Signed)
CSW placed call to [name] to discuss referral for psychiatry and HH SW and HHA.  [name] states [facility] RN continues to come to home.  CSW informed pt, Dr. Burtis Junes adding additional services, [facility] SW and HHA.  Pt in agreement.  [name] states she only needs help with meal prep, she states she is independent with all other ADL's.  CSW informed [name] HHA will be for a limited time and CSW has list of private duty aides that are available for private pay.  Pt states she only gets paid once a month and can not afford to pay privately.  [name] is on the list for meals on wheels.  CSW will mail pt information on [employer] for ordering meals.  Referral to [facility] to add HHA and HH SW complete. CSW discussed referral to psychiatry.  Pt declines, stating "I don't need that".  CSW discussed the specialty of psychiatry, pt declines.

## 2012-05-28 ENCOUNTER — Other Ambulatory Visit: Payer: Self-pay | Admitting: Internal Medicine

## 2012-05-28 ENCOUNTER — Encounter: Payer: BC Managed Care – PPO | Admitting: Internal Medicine

## 2012-05-28 NOTE — Telephone Encounter (Signed)
Please ask her to resch her appt - had to cancel today as no transportation. Anytime in next 60 days. Needs BMP after ACEI started. Thanks

## 2012-05-31 ENCOUNTER — Encounter: Payer: Self-pay | Admitting: Internal Medicine

## 2012-05-31 DIAGNOSIS — I739 Peripheral vascular disease, unspecified: Secondary | ICD-10-CM | POA: Insufficient documentation

## 2012-06-11 ENCOUNTER — Encounter: Payer: BC Managed Care – PPO | Admitting: Internal Medicine

## 2012-06-25 ENCOUNTER — Ambulatory Visit (INDEPENDENT_AMBULATORY_CARE_PROVIDER_SITE_OTHER): Payer: BC Managed Care – PPO | Admitting: Internal Medicine

## 2012-06-25 ENCOUNTER — Encounter: Payer: Self-pay | Admitting: Internal Medicine

## 2012-06-25 VITALS — BP 147/78 | HR 79 | Temp 98.0°F | Ht 67.0 in | Wt 185.5 lb

## 2012-06-25 DIAGNOSIS — E785 Hyperlipidemia, unspecified: Secondary | ICD-10-CM

## 2012-06-25 DIAGNOSIS — I2510 Atherosclerotic heart disease of native coronary artery without angina pectoris: Secondary | ICD-10-CM

## 2012-06-25 DIAGNOSIS — E663 Overweight: Secondary | ICD-10-CM

## 2012-06-25 DIAGNOSIS — R351 Nocturia: Secondary | ICD-10-CM

## 2012-06-25 DIAGNOSIS — Z0000 Encounter for general adult medical examination without abnormal findings: Secondary | ICD-10-CM

## 2012-06-25 DIAGNOSIS — Z6825 Body mass index (BMI) 25.0-25.9, adult: Secondary | ICD-10-CM

## 2012-06-25 DIAGNOSIS — Z742 Need for assistance at home and no other household member able to render care: Secondary | ICD-10-CM

## 2012-06-25 DIAGNOSIS — Z720 Tobacco use: Secondary | ICD-10-CM

## 2012-06-25 DIAGNOSIS — Z79899 Other long term (current) drug therapy: Secondary | ICD-10-CM

## 2012-06-25 DIAGNOSIS — I6350 Cerebral infarction due to unspecified occlusion or stenosis of unspecified cerebral artery: Secondary | ICD-10-CM

## 2012-06-25 DIAGNOSIS — I10 Essential (primary) hypertension: Secondary | ICD-10-CM

## 2012-06-25 DIAGNOSIS — E119 Type 2 diabetes mellitus without complications: Secondary | ICD-10-CM

## 2012-06-25 LAB — GLUCOSE, CAPILLARY: Glucose-Capillary: 112 mg/dL — ABNORMAL HIGH (ref 70–99)

## 2012-06-25 LAB — POCT GLYCOSYLATED HEMOGLOBIN (HGB A1C): Hemoglobin A1C: 6.7

## 2012-06-25 MED ORDER — TOLTERODINE TARTRATE ER 4 MG PO CP24: 4.0000 mg | ORAL_CAPSULE | Freq: Every day | ORAL | Status: DC

## 2012-06-25 MED ORDER — METOPROLOL SUCCINATE 12.5 MG HALF TABLET: 25.0000 mg | ORAL_TABLET | Freq: Every day | ORAL | Status: DC

## 2012-06-25 NOTE — Assessment & Plan Note (Signed)
She is on a BB, statin, and ASA. Asymptomatic. LDL not at goal. Will recheck next visit.

## 2012-06-25 NOTE — Patient Instructions (Addendum)
See me in one month. I sent your refill and new medicine to [facility] Call me with any issues

## 2012-06-25 NOTE — Assessment & Plan Note (Signed)
BMI is 28. More pressing issues to address today.

## 2012-06-25 NOTE — Assessment & Plan Note (Signed)
Many items are over due but pt refuses mostly 2/2 cost. Refused flu vaccine, optho referral, MMG.

## 2012-06-25 NOTE — Assessment & Plan Note (Signed)
Metformin was D/C'd last visit. She had been off of it until this AM. She felt shaky and had hot flashes and was sweating. She thought her CBG was too low. Has no money for test strips to check her CBG. Took coffee with sugar in it. Also took a metformin bc she thought her sugar was too low. Understands that metformin is to treat high blood sugar and will make low blood sugar worse. A1C today 6.7. To remain off metformin.  Many HM items are due but pt refuses 2/2 cost of co-pays.

## 2012-06-25 NOTE — Assessment & Plan Note (Signed)
She has nocturia 4-5 times nightly. She urinates 5-6 times per day. She has mixed stress and urge incontinence sxs. She estimates she drinks 2 L of fluid per day - Kool-aid, soda, and water. She has been tested and R/O for UTI.   Stop fluid intake in late afternoon. Trial of Detrol.  RTC one month to see response.

## 2012-06-25 NOTE — Progress Notes (Signed)
  Subjective:    Patient ID: Vanessa Calderon, female    DOB: [birth year], 66 y.o.   MRN: [id number]  HPI  Please see the A&P for the status of the pt's chronic medical problems.   Review of Systems  Constitutional: Negative for activity change, appetite change and unexpected weight change.  HENT: Negative for congestion, rhinorrhea, sneezing and sinus pressure.   Eyes: Negative for discharge and itching.  Respiratory: Positive for cough. Negative for chest tightness and shortness of breath.   Cardiovascular: Negative for chest pain and leg swelling.  Gastrointestinal: Negative for nausea, vomiting, abdominal pain, diarrhea and constipation.  Genitourinary: Positive for frequency. Negative for dysuria and difficulty urinating.       Nocturia, incontinence  Neurological: Negative for dizziness, weakness, light-headedness and headaches.  Psychiatric/Behavioral: Positive for disturbed wake/sleep cycle.       Depressed       Objective:   Physical Exam  Constitutional: She is oriented to person, place, and time. She appears well-developed and well-nourished. No distress.  HENT:  Head: Normocephalic and atraumatic.  Right Ear: External ear normal.  Left Ear: External ear normal.  Nose: Nose normal.  Mouth/Throat: Oropharynx is clear and moist.  Eyes: Conjunctivae normal and EOM are normal. Pupils are equal, round, and reactive to light.  Neck: No tracheal deviation present. No thyromegaly present.  Cardiovascular: Normal rate, regular rhythm and normal heart sounds.   Pulmonary/Chest: Effort normal and breath sounds normal.  Musculoskeletal: Normal range of motion. She exhibits no edema.  Neurological: She is alert and oriented to person, place, and time.  Skin: Skin is warm and dry. No erythema.  Psychiatric: She has a normal mood and affect. Judgment and thought content normal.       See A&P for more pysch details          Assessment & Plan:

## 2012-06-25 NOTE — Assessment & Plan Note (Signed)
On ASA. No new sxs or TIA / CVA. Residual weakness.

## 2012-06-25 NOTE — Assessment & Plan Note (Signed)
Not interested in quitting today.  

## 2012-06-25 NOTE — Assessment & Plan Note (Signed)
LDL not at goal. Will need FLP rechecked next visit.

## 2012-06-25 NOTE — Assessment & Plan Note (Addendum)
BP 147/78. BO not at goal. I am concerned about excalating her meds though 2/2 living alone, ? Mental status last visit, and ? compliance 2/2 med cost. Leave meds as is and call pharmacy to see what she is picking up.   [facility] Aid and is getting meds at the [place] store. Got all HTN meds and statin in Aug and Sep. Oct meds are waiting to be picked up.  Will increase lisinopril next visit. If still high.

## 2012-06-26 ENCOUNTER — Other Ambulatory Visit: Payer: Self-pay | Admitting: Internal Medicine

## 2012-06-26 ENCOUNTER — Telehealth: Payer: Self-pay | Admitting: Licensed Clinical Social Worker

## 2012-06-26 DIAGNOSIS — I2510 Atherosclerotic heart disease of native coronary artery without angina pectoris: Secondary | ICD-10-CM

## 2012-06-26 DIAGNOSIS — I10 Essential (primary) hypertension: Secondary | ICD-10-CM

## 2012-06-26 DIAGNOSIS — Z742 Need for assistance at home and no other household member able to render care: Secondary | ICD-10-CM | POA: Insufficient documentation

## 2012-06-26 MED ORDER — METOPROLOL SUCCINATE ER 25 MG PO TB24: 25.0000 mg | ORAL_TABLET | Freq: Every day | ORAL | Status: DC

## 2012-06-26 NOTE — Telephone Encounter (Signed)
[name] was referred to CSW for home health services.  Pt most recently was seen by [facility] RN on 10/3, PT on 10/8.  CSW placed call and spoke with [facility] hospital Liaison, [place].  Corrie Dandy will initiate Home health RN based on most recent doctor visit.  CSW placed call to [name].  Discussed even though insurance does not cover PCS, possibility of paying privately.  Pt declined due to lack of funds.  During PCP visit, [name] declined information on Assisted Living/SNF but pt will to accept information on senior housing.  CSW will send information on affordable senior housing as most over resident oversight at some level.

## 2012-06-26 NOTE — Assessment & Plan Note (Signed)
Her unsafe living arrangement was the focus of the last [facility] visit.  1. APS was consulted. [name] had never heard back from them and no note in chart. 2. Referral to [facility] - pt didn't go and doesn't plan to go as she "is not crazy". She does endorse some depression and has been on SSRI's in past but is not interested in restarting tx. 3. Home health referral - pt states nurse did come for assessment, was told that she needed help but that her insurance would not pay. She needs help with cooking, cleaning, and bathing. She is independent eating and dressing.  Last visit there was concern about dementia. Currently, she is A&O x3. She has decent insight into her health and consequences of living alone. I used example that if she fell or had another CVA / AMI and couldn't get to the phone, what would she do? She understood that she would lay there until someone came and if no one came, she might die - that it would be "her time." She doesn't want ALF, SNF bc all they want is your money.   I think she is competent to make her own decisions even if those decisions are not in her best interest.  She has sig financial issues. Has trouble affording groceries. Has electricity, water, phone. Friend lives next door and drives her to get groceries and to MD appts. Friend doesn't help with anything else. She is not close with her daughter. She has no other relatives. Has W&D in apartment and can do laundry.   Today 1. Mamie got her food from pantry 2. Referred back for home health eval 3. Talked with Shana in hallway - not a formal re-eval. 4. RTC in one month.

## 2012-07-08 ENCOUNTER — Emergency Department (HOSPITAL_COMMUNITY): Payer: BC Managed Care – PPO

## 2012-07-08 ENCOUNTER — Emergency Department (HOSPITAL_COMMUNITY)
Admission: EM | Admit: 2012-07-08 | Discharge: 2012-07-08 | Disposition: A | Discharge status: Home / Self Care | Payer: BC Managed Care – PPO | Attending: Emergency Medicine | Admitting: Emergency Medicine

## 2012-07-08 ENCOUNTER — Other Ambulatory Visit: Payer: Self-pay

## 2012-07-08 ENCOUNTER — Encounter (HOSPITAL_COMMUNITY): Payer: Self-pay

## 2012-07-08 DIAGNOSIS — Z9861 Coronary angioplasty status: Secondary | ICD-10-CM | POA: Insufficient documentation

## 2012-07-08 DIAGNOSIS — M79609 Pain in unspecified limb: Secondary | ICD-10-CM | POA: Insufficient documentation

## 2012-07-08 DIAGNOSIS — I2510 Atherosclerotic heart disease of native coronary artery without angina pectoris: Secondary | ICD-10-CM | POA: Insufficient documentation

## 2012-07-08 DIAGNOSIS — F17200 Nicotine dependence, unspecified, uncomplicated: Secondary | ICD-10-CM | POA: Insufficient documentation

## 2012-07-08 DIAGNOSIS — Z79899 Other long term (current) drug therapy: Secondary | ICD-10-CM | POA: Insufficient documentation

## 2012-07-08 DIAGNOSIS — I10 Essential (primary) hypertension: Secondary | ICD-10-CM | POA: Insufficient documentation

## 2012-07-08 DIAGNOSIS — E119 Type 2 diabetes mellitus without complications: Secondary | ICD-10-CM | POA: Insufficient documentation

## 2012-07-08 DIAGNOSIS — M79606 Pain in leg, unspecified: Secondary | ICD-10-CM

## 2012-07-08 DIAGNOSIS — Z7982 Long term (current) use of aspirin: Secondary | ICD-10-CM | POA: Insufficient documentation

## 2012-07-08 DIAGNOSIS — Z8673 Personal history of transient ischemic attack (TIA), and cerebral infarction without residual deficits: Secondary | ICD-10-CM | POA: Insufficient documentation

## 2012-07-08 DIAGNOSIS — E785 Hyperlipidemia, unspecified: Secondary | ICD-10-CM | POA: Insufficient documentation

## 2012-07-08 LAB — POCT I-STAT, CHEM 8
BUN: 11 mg/dL (ref 6–23)
Calcium, Ion: 1.23 mmol/L (ref 1.13–1.30)
Hemoglobin: 12.6 g/dL (ref 12.0–15.0)
Sodium: 144 mEq/L (ref 135–145)
TCO2: 23 mmol/L (ref 0–100)

## 2012-07-08 LAB — URINALYSIS, ROUTINE W REFLEX MICROSCOPIC
Glucose, UA: NEGATIVE mg/dL
Ketones, ur: NEGATIVE mg/dL
Protein, ur: NEGATIVE mg/dL
Urobilinogen, UA: 1 mg/dL (ref 0.0–1.0)

## 2012-07-08 LAB — URINE MICROSCOPIC-ADD ON

## 2012-07-08 MED ORDER — CEPHALEXIN 500 MG PO CAPS: 500.0000 mg | ORAL_CAPSULE | Freq: Four times a day (QID) | ORAL | Status: DC

## 2012-07-08 MED ORDER — HYDROCODONE-ACETAMINOPHEN 5-325 MG PO TABS
1.0000 | ORAL_TABLET | Freq: Once | ORAL | Status: AC
  Administered 2012-07-08: 1 via ORAL
  Filled 2012-07-08: qty 1

## 2012-07-08 NOTE — ED Provider Notes (Signed)
History     CSN: [id number]  Arrival date & time 07/08/12  1531   First MD Initiated Contact with Patient 07/08/12 1538      Chief Complaint  Patient presents with  . Leg Pain    (Consider location/radiation/quality/duration/timing/severity/associated sxs/prior treatment) Patient is a 66 y.o. female presenting with leg pain. The history is provided by the patient.  Leg Pain  The incident occurred 12 to 24 hours ago. The incident occurred at home. There was no injury mechanism. The pain is present in the right thigh. Quality: Pt unable to describe. The pain is severe. The pain has been constant since onset. Pertinent negatives include no numbness, no loss of motion, no muscle weakness, no loss of sensation and no tingling. Associated symptoms comments: Pain on weight bearing . She reports no foreign bodies present. The symptoms are aggravated by bearing weight. She has tried NSAIDs and acetaminophen for the symptoms. The treatment provided no relief.    Past Medical History  Diagnosis Date  . CORONARY ARTERY DISEASE 05/15/2008    CABG 2006, DES to the saphenous vein graft to the ramus in December of 2006.  ECHO 2010 : basal inferior wall akinesis. Post-op septal wall dyssynchrony. Grade 1diastolic dysfunction.       [name] DIABETES MELLITUS, TYPE II 05/15/2008    Well controlled on oral meds.    [name] HYPERLIPIDEMIA 05/11/2008    Mgmt with statin    . HYPERTENSION 05/15/2008    Needs BB 2/2 CAD and ACEI 2/2 DM.     [name] Overweight (BMI 25.0-29.9) 03/27/2012  . Tobacco use 03/27/2012    She has tried Chantix in past with intolerable side effects.   [name] CVA 09/23/2009    Left CEA May 2006 at same time as CABG 2010 : Acute left hemisphere infarct of posterior limb internal capsule and lateral thalamus.  Prior CVA's but dates unknown, imaging shows widespread multifocal remote lacunar infarcts throughout the brain and brainstem.  MRA 2010 : no severe proximal flow limiting stenosis.    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 Coronary artery bypass graft May 2006    Dr Tyrone Sage  . Abdominal hysterectomy   . Cholecystectomy   . Back surgery     lumbar L3-L5  . Exploratory laparotomy 7/03    Removed the cervix and now has vaginal cuff  . Carotid endarterectomy May 2006    Left - at same time as CABG. Dr Philip Aspen.  . Angioplasty / stenting femoral 11/03/2010    B aorto-fem angiogram with artherectomy and stenting of the R SFA 2/2 claudication.    History reviewed. No pertinent family history.  History  Substance Use Topics  . Smoking status: Current Every Day Smoker -- 0.5 packs/day    Types: Cigarettes  . Smokeless tobacco: Current User   Comment: will let doctor know  . Alcohol Use: No    OB History    Grav Para Term Preterm Abortions TAB SAB Ect Mult Living                  Review of Systems  Constitutional: Negative for fever.  Respiratory: Negative for cough and shortness of breath.   Cardiovascular: Negative for chest pain.  Gastrointestinal: Negative for nausea, abdominal pain and diarrhea.  Neurological: Negative for tingling, numbness and headaches.  All other systems reviewed and are negative.    Allergies  Chantix; Codeine; Codeine phosphate; and  Penicillins  Home Medications   Current Outpatient Rx  Name Route Sig Dispense Refill  . AMLODIPINE BESYLATE 10 MG PO TABS Oral Take 10 mg by mouth daily.    . ASPIRIN 325 MG PO TABS Oral Take 325 mg by mouth daily.    [name] FREESTYLE SYSTEM KIT  Use to check sugar up to once a day or when you feel your sugar might be low. May substitute any meter system her insurance covers 1 each 0  . LISINOPRIL 10 MG PO TABS  take 1 tablet by mouth once daily 30 tablet 1  . METOPROLOL SUCCINATE ER 25 MG PO TB24 Oral Take 1 tablet (25 mg total) by mouth daily. 30 tablet 2  . SIMVASTATIN 40 MG PO TABS Oral Take 1 tablet (40 mg total) by mouth every evening. 30 tablet 5  .  TOLTERODINE TARTRATE ER 4 MG PO CP24 Oral Take 1 capsule (4 mg total) by mouth daily. 30 capsule 0    BP 132/64  Pulse 81  Resp 24  SpO2 97%  Physical Exam  Nursing note and vitals reviewed. Constitutional: She is oriented to person, place, and time. She appears well-developed and well-nourished. No distress.  HENT:  Head: Normocephalic and atraumatic.  Eyes: EOM are normal. Pupils are equal, round, and reactive to light.  Neck: Normal range of motion.  Cardiovascular: Normal rate and normal heart sounds.   Pulmonary/Chest: Effort normal and breath sounds normal. No respiratory distress.  Abdominal: Soft. She exhibits no distension. There is no tenderness.  Musculoskeletal: Normal range of motion.  Neurological: She is alert and oriented to person, place, and time.  Skin: Skin is warm and dry.    ED Course  Procedures (including critical care time)  Labs Reviewed  POCT I-STAT, CHEM 8 - Abnormal; Notable for the following:    Glucose, Bld 183 (*)     All other components within normal limits  URINALYSIS, ROUTINE W REFLEX MICROSCOPIC - Abnormal; Notable for the following:    APPearance CLOUDY (*)     Hgb urine dipstick SMALL (*)     Bilirubin Urine SMALL (*)     Leukocytes, UA SMALL (*)     All other components within normal limits  URINE MICROSCOPIC-ADD ON - Abnormal; Notable for the following:    Squamous Epithelial / LPF FEW (*)     Bacteria, UA MANY (*)     All other components within normal limits  URINE CULTURE   Dg Lumbar Spine 2-3 Views  07/08/2012  *RADIOLOGY REPORT*  Clinical Data: Leg pain. Fall.  Back pain.  LUMBAR SPINE - 2-3 VIEW  Comparison: 03/17/2010.  05/24/2009.  Findings: Left L4-L5 rod and pedicle screws are present. Discectomy with interbody bone graft.  Grade 1 anterolisthesis of L4 on L5.  No hardware complication.  Vertebral body height is preserved.  Abdominal aortic atherosclerosis is present. Lumbosacral junction appears within normal limits.   IMPRESSION: No acute osseous abnormality.   Original Report Authenticated By: Andreas Newport, M.D.    Dg Hip Complete Right  07/08/2012  *RADIOLOGY REPORT*  Clinical Data: Right hip and low back pain.  Fall 2 weeks ago.  RIGHT HIP - COMPLETE 2+ VIEW  Comparison: 03/17/2010.  Findings: Left-sided L4-L5 rod and pedicle screw fixation.  Sacral arcades appear intact.  The right obturator ring appears within normal limits.  Right superficial femoral artery stent is present, long segment and new since 03/17/2010.  The hip joint spaces appear symmetric.  There is no  femur fracture identified.  IMPRESSION: No acute osseous abnormality.   Original Report Authenticated By: Andreas Newport, M.D.      1. Leg pain       MDM  3:50 PM Pt seen and examined. Pt complains of one day of right upper leg pain, mostly in the soft tissue of her right groin (femur, not abdomen). Pt denies any falls today, but did have one two weeks ago where she landed on her right side. Pt also with scar over and some mild lumbar pain. Will get XR of lumbar spine and right hip. Will also check urine and lytes as patient complaining of frequency of urination.   8:24 PM Pt left before she could be discharged. Pt may have early UTI and if she comes back, will give antibiotics.      Daleen Bo, MD 07/08/12 [phone]

## 2012-07-08 NOTE — ED Notes (Signed)
Right leg is colder than other, had stent a year ago.  CVA 3 years ago.

## 2012-07-08 NOTE — ED Notes (Signed)
No palpable pulses in lower extremities.

## 2012-07-08 NOTE — ED Notes (Signed)
MD at bedside. 

## 2012-07-08 NOTE — ED Notes (Signed)
Gave old and new ECG to Dr. Bernette Mayers after I performed. 3:48 pm JG.[name]

## 2012-07-08 NOTE — ED Notes (Signed)
Pt called cab from nurse 1st

## 2012-07-08 NOTE — ED Provider Notes (Signed)
I saw and evaluated the patient, reviewed the resident's note and I agree with the findings and plan.  Pt with recent fall complaining of L thigh pain. Warm leg with pulses.   Charles B. Bernette Mayers, MD 07/08/12 1810

## 2012-07-10 NOTE — Addendum Note (Signed)
Addended by: Neomia Dear on: 07/10/2012 09:38 AM   Modules accepted: Orders

## 2012-07-13 LAB — URINE CULTURE: Colony Count: >=100000

## 2012-07-14 NOTE — ED Notes (Signed)
+  Urine. Patient treated with Keflex. Sensitive to same. Per protocol MD. °

## 2012-07-29 ENCOUNTER — Other Ambulatory Visit: Payer: Self-pay | Admitting: *Deleted

## 2012-07-29 DIAGNOSIS — I6350 Cerebral infarction due to unspecified occlusion or stenosis of unspecified cerebral artery: Secondary | ICD-10-CM

## 2012-07-29 DIAGNOSIS — R351 Nocturia: Secondary | ICD-10-CM

## 2012-07-29 DIAGNOSIS — I10 Essential (primary) hypertension: Secondary | ICD-10-CM

## 2012-07-29 DIAGNOSIS — I2510 Atherosclerotic heart disease of native coronary artery without angina pectoris: Secondary | ICD-10-CM

## 2012-07-30 ENCOUNTER — Ambulatory Visit: Payer: BC Managed Care – PPO | Admitting: Internal Medicine

## 2012-07-30 ENCOUNTER — Ambulatory Visit: Payer: BC Managed Care – PPO

## 2012-07-30 MED ORDER — AMLODIPINE BESYLATE 10 MG PO TABS: 10.0000 mg | ORAL_TABLET | Freq: Every day | ORAL | Status: DC

## 2012-07-30 MED ORDER — METOPROLOL SUCCINATE ER 25 MG PO TB24: 25.0000 mg | ORAL_TABLET | Freq: Every day | ORAL | Status: DC

## 2012-07-30 MED ORDER — TOLTERODINE TARTRATE ER 4 MG PO CP24: 4.0000 mg | ORAL_CAPSULE | Freq: Every day | ORAL | Status: DC

## 2012-07-30 MED ORDER — ASPIRIN 325 MG PO TABS: 325.0000 mg | ORAL_TABLET | Freq: Every day | ORAL | Status: DC

## 2012-07-30 MED ORDER — LISINOPRIL 10 MG PO TABS: 10.0000 mg | ORAL_TABLET | Freq: Every day | ORAL | Status: DC

## 2012-07-30 MED ORDER — SIMVASTATIN 40 MG PO TABS: 40.0000 mg | ORAL_TABLET | Freq: Every day | ORAL | Status: DC

## 2012-07-30 NOTE — Telephone Encounter (Signed)
Cancelled last two appt. At high risk. Needs appt - can be with me in CC or [facility] long appt slot (more than one issue needs addressed)

## 2012-07-30 NOTE — Telephone Encounter (Signed)
Flag sent to front desk pool for appt per Dr Rogelia Boga and left message on home phone ID recording - Rx are at pharmacy and [facility] will call pt with appt.

## 2012-08-13 ENCOUNTER — Ambulatory Visit: Payer: BC Managed Care – PPO | Admitting: Internal Medicine

## 2012-08-20 ENCOUNTER — Ambulatory Visit: Payer: BC Managed Care – PPO | Admitting: Internal Medicine

## 2012-09-03 ENCOUNTER — Ambulatory Visit: Payer: BC Managed Care – PPO | Admitting: Internal Medicine

## 2012-09-26 ENCOUNTER — Other Ambulatory Visit: Payer: Self-pay | Admitting: Internal Medicine

## 2012-10-08 ENCOUNTER — Encounter: Payer: Self-pay | Admitting: Internal Medicine

## 2012-10-08 ENCOUNTER — Encounter: Payer: Self-pay | Admitting: Licensed Clinical Social Worker

## 2012-10-08 ENCOUNTER — Ambulatory Visit (INDEPENDENT_AMBULATORY_CARE_PROVIDER_SITE_OTHER): Payer: BC Managed Care – PPO | Admitting: Internal Medicine

## 2012-10-08 VITALS — BP 133/75 | HR 85 | Temp 98.1°F | Resp 20 | Ht 64.0 in | Wt 184.6 lb

## 2012-10-08 DIAGNOSIS — F17200 Nicotine dependence, unspecified, uncomplicated: Secondary | ICD-10-CM

## 2012-10-08 DIAGNOSIS — Z720 Tobacco use: Secondary | ICD-10-CM

## 2012-10-08 DIAGNOSIS — I2510 Atherosclerotic heart disease of native coronary artery without angina pectoris: Secondary | ICD-10-CM

## 2012-10-08 DIAGNOSIS — E119 Type 2 diabetes mellitus without complications: Secondary | ICD-10-CM

## 2012-10-08 DIAGNOSIS — Z79899 Other long term (current) drug therapy: Secondary | ICD-10-CM

## 2012-10-08 DIAGNOSIS — I6350 Cerebral infarction due to unspecified occlusion or stenosis of unspecified cerebral artery: Secondary | ICD-10-CM

## 2012-10-08 DIAGNOSIS — E785 Hyperlipidemia, unspecified: Secondary | ICD-10-CM

## 2012-10-08 DIAGNOSIS — E663 Overweight: Secondary | ICD-10-CM

## 2012-10-08 DIAGNOSIS — R32 Unspecified urinary incontinence: Secondary | ICD-10-CM

## 2012-10-08 DIAGNOSIS — Z742 Need for assistance at home and no other household member able to render care: Secondary | ICD-10-CM

## 2012-10-08 DIAGNOSIS — I10 Essential (primary) hypertension: Secondary | ICD-10-CM

## 2012-10-08 LAB — BASIC METABOLIC PANEL WITH GFR
BUN: 7 mg/dL (ref 6–23)
Chloride: 103 mEq/L (ref 96–112)
Creat: 0.73 mg/dL (ref 0.50–1.10)
GFR, Est Non African American: 87 mL/min
Glucose, Bld: 347 mg/dL — ABNORMAL HIGH (ref 70–99)
Potassium: 4.2 mEq/L (ref 3.5–5.3)

## 2012-10-08 LAB — LIPID PANEL
HDL: 43 mg/dL (ref >39)
LDL Cholesterol: 113 mg/dL — ABNORMAL HIGH (ref 0–99)
Total CHOL/HDL Ratio: 4.1 Ratio
Triglycerides: 109 mg/dL (ref <150)
VLDL: 22 mg/dL (ref 0–40)

## 2012-10-08 MED ORDER — METFORMIN HCL 500 MG PO TABS: 500.0000 mg | ORAL_TABLET | Freq: Two times a day (BID) | ORAL | Status: DC

## 2012-10-08 NOTE — Progress Notes (Signed)
Ms. Mitzie Na was referred to CSW today during scheduled [facility] appt.  CSW was under the impression, pt's BC/BS was a medicare replacement policy.  However, this was not the case.  Pt's PCP would like referral to [facility] as pt is in need of community care management.  Ms. Mitzie Na states she has limited funds and has declined private pay in home services and assisted living.  CSW met with Ms. Mitzie Na to discuss her enrollment into Medicare.  Pt states she enrolled one month prior to her 66th birthday.  CSW placed call to [facility] with pt in room.  Ginette Otto SSA has no record of pt enrolling in Medicare and will need to apply in person or over the phone.  SSA recommending [name] scheduling an appt and applying in-person, next available day Jan. 24th.  Pt will need to check with her transportation prior to scheduling appt.  Pt also able to apply over the phone but SSA suggesting Ms. Mitzie Na having someone that would be able to assist her over the phone as SSA states it is difficult to understand pt over the phone.  Pt informed and provided with the phone number and address to [facility]. CSW discussed referral to [facility] once enrolled with Medicare.  Pt in agreement once pt was informed there was no cost.  CSW will inquire if [facility] can assist with pt's enrollment to Medicare if possible.

## 2012-10-08 NOTE — Assessment & Plan Note (Signed)
Rec decreased cakes and cookies.

## 2012-10-08 NOTE — Assessment & Plan Note (Signed)
I gave her 1800 QUIT now number.

## 2012-10-08 NOTE — Assessment & Plan Note (Addendum)
BP Readings from Last 3 Encounters:  10/08/12 133/75  07/08/12 126/61  06/25/12 147/78    Lab Results  Component Value Date   NA 144 07/08/2012   K 3.6 07/08/2012   CREATININE 0.80 07/08/2012    Assessment:  Blood pressure control: controlled  Progress toward BP goal:  at goal  Comments: Controlled.   Plan:  Medications:  continue current medications  Educational resources provided:    Self management tools provided:    Other plans: Cannot name names of meds but seemed to recognize names as I read them. BB, ACEI, CCB. Monitor BP. No home monitoring.

## 2012-10-08 NOTE — Assessment & Plan Note (Signed)
This is most troublesome to her. I treated an E coli infxn last visit but no improvement. I had Rx'd Detrol as she had some components of urge incontinence bc never got 2/2 cost. She got an OTC patch that the pharmacy rec Oxytrol (which is detrol - I didn't realize it was OTC) but that didn't help. I have instructed her to decrease fluid intake. Day time incontinence about 3 times and 4-5 times at night, all requiring changing clothes. At night, her bed gets wet. No burning, blood, vaginal D/C. No GI issues. When she goes to a toilet, she can urinate but doesn't feel like it all comes out. PVR only 37 cc today. She does not feel the sensation that she needs to urinate - the urine just comes out. She voided prior to leaving home today but had urinary incontinence on way her - didn't know it until urine was down her leg. Never had this issues before.  I have treated the UTI. No components of stress incontinence. Doubt overflow. She is not on a diuretic. She does have mobility issues. Poorly controlled DM so maybe autonomic dysfxn?  Check labs. Referral to urology but doubt pt will go 2/2 the required co-payment.

## 2012-10-08 NOTE — Assessment & Plan Note (Addendum)
She lives in an apartment. She uses a cane inside. She has a friend that lives "across the way." she is able to do her laundry. Her friend takes her to the store and cleans her house. She cannot cook. She gets meals on wheels but that only provides one meal. She eats cakes and cookies for other meals bc she does feel hungry. She did have a fall last month but was able to get to the phone and call a neighbor who had to help her up. She feels safe at home. She adamantly refuses SNF or ALF bc "All they want to do is take your money." As I have documented before, she does have capacity even though she doesn't make good decisions. [name] is working with her. She states she applied for medicare but hasn't gotten her card. [name] is looking further into this. States turned down for disability and has Hillery Jacks for disability doctor.  [name] will make [facility] referral. PT in agreement.

## 2012-10-08 NOTE — Patient Instructions (Signed)
General Instructions: Try to get a glucose meter and check your sugar in the morning and if you feel it is getting low.  Please try to see urology.  Use Depends until you can see urology.  Call 1800 QUIT now for help quitting smoking.  Try to eats fruits and veggies instead of cake and cookies. It will help your diabetes.  Start the metformin 500 mg twice a day for your diabetes.   Treatment Goals:  Goals (1 Years of Data) as of 10/08/2012    None      Progress Toward Treatment Goals:  Treatment Goal 10/08/2012  Hemoglobin A1C deteriorated  Blood pressure at goal  Stop smoking smoking the same amount    Self Care Goals & Plans:  Self Care Goal 10/08/2012  Monitor my health keep track of my blood glucose  Eat healthy foods eat fruit for snacks and desserts  Be physically active (No Data)  Stop smoking call [facility] (1-800-QUIT-NOW)    Home Blood Glucose Monitoring 10/08/2012  Check my blood sugar once a day  When to check my blood sugar before breakfast     Care Management & Community Referrals:  Referral 10/08/2012  Referrals made for care management support social worker  Referrals made to community resources meal support

## 2012-10-08 NOTE — Assessment & Plan Note (Signed)
Cont to take her ASA daily.

## 2012-10-08 NOTE — Assessment & Plan Note (Signed)
Lab Results  Component Value Date   HGBA1C 12.3 10/08/2012   HGBA1C 6.7 06/25/2012   HGBA1C 6.0 03/26/2012     Assessment:  Diabetes control: poor control (HgbA1C >9%)  Progress toward A1C goal:  deteriorated  Comments:   Plan:  Medications:  Metformin  Home glucose monitoring:   Frequency: once a day   Timing: before breakfast  Instruction/counseling given: discussed diet  Educational resources provided:    Self management tools provided:    Other plans: Will resume Metformin 500 BID. Rx sent. It had been stopped last visit 2/2 hypoglycemia. She is eating cookies and cakes for two of her three meals bc she cannot prep meals. She gets meals on wheels but that only provides one meal a day. Encouraged fruits and veggies for snacks. F/U 3 months.

## 2012-10-08 NOTE — Progress Notes (Signed)
  Subjective:    Patient ID: Vanessa Calderon, female    DOB: 12-25-1945, 67 y.o.   MRN: [id number]  HPI  Please see the A&P for the status of the pt's chronic medical problems.   Review of Systems  Constitutional: Negative for fever, chills, diaphoresis and unexpected weight change.  HENT: Negative for sore throat, rhinorrhea and trouble swallowing.   Eyes: Negative for discharge and itching.  Respiratory: Positive for cough. Negative for chest tightness and shortness of breath.   Cardiovascular: Positive for leg swelling. Negative for chest pain and palpitations.  Gastrointestinal: Negative for nausea, diarrhea and blood in stool.  Genitourinary: Positive for urgency and frequency. Negative for dysuria, hematuria and vaginal discharge.  Skin: Positive for rash.  Neurological: Negative for dizziness, light-headedness and headaches.  Psychiatric/Behavioral: Positive for sleep disturbance.       Objective:   Physical Exam  Constitutional: She is oriented to person, place, and time. She appears well-developed and well-nourished. No distress.  HENT:  Head: Normocephalic and atraumatic.  Right Ear: External ear normal.  Left Ear: External ear normal.  Nose: Nose normal.  Eyes: Conjunctivae normal and EOM are normal. Pupils are equal, round, and reactive to light.  Cardiovascular: Normal rate, regular rhythm and normal heart sounds.  Exam reveals no friction rub.   No murmur heard. Pulmonary/Chest: Effort normal and breath sounds normal. No respiratory distress.  Musculoskeletal: Normal range of motion. She exhibits no edema and no tenderness.  Neurological: She is alert and oriented to person, place, and time.  Skin: Skin is warm and dry. She is not diaphoretic.  Psychiatric: She has a normal mood and affect. Her behavior is normal. Judgment and thought content normal.          Assessment & Plan:

## 2012-10-08 NOTE — Assessment & Plan Note (Signed)
BB, ASA, statin. 

## 2012-10-08 NOTE — Assessment & Plan Note (Signed)
Will check FLP. PT does take her statin.

## 2012-10-24 ENCOUNTER — Ambulatory Visit: Payer: BC Managed Care – PPO

## 2012-10-25 ENCOUNTER — Other Ambulatory Visit: Payer: Self-pay | Admitting: Internal Medicine

## 2012-10-25 ENCOUNTER — Telehealth: Payer: Self-pay | Admitting: Internal Medicine

## 2012-10-25 MED ORDER — ROSUVASTATIN CALCIUM 10 MG PO TABS: 10.0000 mg | ORAL_TABLET | Freq: Every day | ORAL | Status: DC

## 2012-10-25 NOTE — Telephone Encounter (Signed)
No answer. Left message regarding increased LDL and need for statin change.

## 2012-11-15 NOTE — Addendum Note (Signed)
Addended by: Neomia Dear on: 11/15/2012 06:48 PM   Modules accepted: Orders

## 2012-11-26 ENCOUNTER — Other Ambulatory Visit: Payer: Self-pay | Admitting: Internal Medicine

## 2012-11-26 ENCOUNTER — Telehealth: Payer: Self-pay | Admitting: Licensed Clinical Social Worker

## 2012-11-26 MED ORDER — ROSUVASTATIN CALCIUM 10 MG PO TABS: 10.0000 mg | ORAL_TABLET | Freq: Every day | ORAL | Status: DC

## 2012-11-26 NOTE — Telephone Encounter (Signed)
CSW left message with [name] requesting pt to schedule morning appt with CSW.  CSW available to set aside day to assist [name] with signing up for Medicare over the phone.  CSW left schedule and contact number.

## 2012-11-29 ENCOUNTER — Emergency Department (HOSPITAL_COMMUNITY): Payer: BC Managed Care – PPO

## 2012-11-29 ENCOUNTER — Emergency Department (HOSPITAL_COMMUNITY)
Admission: EM | Admit: 2012-11-29 | Discharge: 2012-11-30 | Disposition: A | Discharge status: Home / Self Care | Payer: BC Managed Care – PPO | Attending: Emergency Medicine | Admitting: Emergency Medicine

## 2012-11-29 DIAGNOSIS — R296 Repeated falls: Secondary | ICD-10-CM | POA: Insufficient documentation

## 2012-11-29 DIAGNOSIS — E119 Type 2 diabetes mellitus without complications: Secondary | ICD-10-CM | POA: Insufficient documentation

## 2012-11-29 DIAGNOSIS — Y92009 Unspecified place in unspecified non-institutional (private) residence as the place of occurrence of the external cause: Secondary | ICD-10-CM | POA: Insufficient documentation

## 2012-11-29 DIAGNOSIS — M25561 Pain in right knee: Secondary | ICD-10-CM

## 2012-11-29 DIAGNOSIS — I2510 Atherosclerotic heart disease of native coronary artery without angina pectoris: Secondary | ICD-10-CM | POA: Insufficient documentation

## 2012-11-29 DIAGNOSIS — F17200 Nicotine dependence, unspecified, uncomplicated: Secondary | ICD-10-CM | POA: Insufficient documentation

## 2012-11-29 DIAGNOSIS — Z79899 Other long term (current) drug therapy: Secondary | ICD-10-CM | POA: Insufficient documentation

## 2012-11-29 DIAGNOSIS — Z8639 Personal history of other endocrine, nutritional and metabolic disease: Secondary | ICD-10-CM | POA: Insufficient documentation

## 2012-11-29 DIAGNOSIS — Z862 Personal history of diseases of the blood and blood-forming organs and certain disorders involving the immune mechanism: Secondary | ICD-10-CM | POA: Insufficient documentation

## 2012-11-29 DIAGNOSIS — Y939 Activity, unspecified: Secondary | ICD-10-CM | POA: Insufficient documentation

## 2012-11-29 DIAGNOSIS — S8990XA Unspecified injury of unspecified lower leg, initial encounter: Secondary | ICD-10-CM | POA: Insufficient documentation

## 2012-11-29 DIAGNOSIS — I10 Essential (primary) hypertension: Secondary | ICD-10-CM | POA: Insufficient documentation

## 2012-11-29 DIAGNOSIS — Z7982 Long term (current) use of aspirin: Secondary | ICD-10-CM | POA: Insufficient documentation

## 2012-11-29 DIAGNOSIS — Z951 Presence of aortocoronary bypass graft: Secondary | ICD-10-CM | POA: Insufficient documentation

## 2012-11-29 DIAGNOSIS — Z8673 Personal history of transient ischemic attack (TIA), and cerebral infarction without residual deficits: Secondary | ICD-10-CM | POA: Insufficient documentation

## 2012-11-29 MED ORDER — OXYCODONE-ACETAMINOPHEN 5-325 MG PO TABS
2.0000 | ORAL_TABLET | Freq: Once | ORAL | Status: AC
  Administered 2012-11-29: 2 via ORAL
  Filled 2012-11-29: qty 2

## 2012-11-29 NOTE — ED Provider Notes (Signed)
History     CSN: [id number]  Arrival date & time 11/29/12  2001   First MD Initiated Contact with Patient 11/29/12 2007      Chief Complaint  Patient presents with  . Fall  . Knee Pain   HPI  History provided by the patient. Patient is a 67 year old female with history of hypertension, hyperlipidemia, diabetes, CAD and CVA with subsequent mild right-sided weakness who presents with complaints of right knee pain after a fall. Patient was at home alone usually uses a cane or sometimes a walker to help with mobility secondary to slight right-sided weakness. This morning patient reports getting out of the shower and feeling slightly off balance and stumbling causing a fall landing on her right knee. She denies any syncope, head injury or LOC. There was no associated chest pain, shortness of breath or heart palpitations. Since that time patient has had some pain that has been increasing to the right knee with more stiffness. She has been able to get around some in the house but symptoms became much worse this evening and so she came to the emergency room by EMS. Patient did take some aspirin as usual this morning but did not use any other treatments. She denies any associated weakness or numbness in the foot. Denies any other injuries or complaints.    Past Medical History  Diagnosis Date  . CORONARY ARTERY DISEASE 05/15/2008    CABG 2006, DES to the saphenous vein graft to the ramus in December of 2006.  ECHO 2010 : basal inferior wall akinesis. Post-op septal wall dyssynchrony. Grade 1diastolic dysfunction.       [name] DIABETES MELLITUS, TYPE II 05/15/2008    Well controlled on oral meds.    [name] HYPERLIPIDEMIA 05/11/2008    Mgmt with statin    . HYPERTENSION 05/15/2008    Needs BB 2/2 CAD and ACEI 2/2 DM.     [name] Overweight (BMI 25.0-29.9) 03/27/2012  . Tobacco use 03/27/2012    She has tried Chantix in past with intolerable side effects.   [name] CVA 09/23/2009    Left CEA May 2006 at same time as CABG  2010 : Acute left hemisphere infarct of posterior limb internal capsule and lateral thalamus.  Prior CVA's but dates unknown, imaging shows widespread multifocal remote lacunar infarcts throughout the brain and brainstem.  MRA 2010 : no severe proximal flow limiting stenosis.    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 Coronary artery bypass graft  May 2006    Dr Tyrone Sage  . Abdominal hysterectomy    . Cholecystectomy    . Back surgery      lumbar L3-L5  . Exploratory laparotomy  7/03    Removed the cervix and now has vaginal cuff  . Carotid endarterectomy  May 2006    Left - at same time as CABG. Dr Philip Aspen.  . Angioplasty / stenting femoral  11/03/2010    B aorto-fem angiogram with artherectomy and stenting of the R SFA 2/2 claudication.    No family history on file.  History  Substance Use Topics  . Smoking status: Current Every Day Smoker -- 0.50 packs/day    Types: Cigarettes  . Smokeless tobacco: Current User     Comment: will let doctor know  . Alcohol Use: No    OB History   Grav Para Term Preterm Abortions TAB SAB Ect Mult Living  Review of Systems  Respiratory: Negative for shortness of breath.   Cardiovascular: Negative for chest pain.  Neurological: Negative for weakness, numbness and headaches.  All other systems reviewed and are negative.    Allergies  Chantix; Codeine; Codeine phosphate; and Penicillins  Home Medications   Current Outpatient Rx  Name  Route  Sig  Dispense  Refill  . amLODipine (NORVASC) 10 MG tablet   Oral   Take 1 tablet (10 mg total) by mouth daily.   30 tablet   1   . aspirin 325 MG tablet   Oral   Take 1 tablet (325 mg total) by mouth daily.   30 tablet   1   . lisinopril (PRINIVIL,ZESTRIL) 10 MG tablet      take 1 tablet by mouth once daily   30 tablet   2   . metFORMIN (GLUCOPHAGE) 500 MG tablet   Oral   Take 1 tablet (500 mg  total) by mouth 2 (two) times daily with a meal.   60 tablet   5   . metoprolol succinate (TOPROL-XL) 25 MG 24 hr tablet   Oral   Take 1 tablet (25 mg total) by mouth daily.   30 tablet   1   . OVER THE COUNTER MEDICATION   Oral   Take 1 capsule by mouth 3 (three) times daily. Omega XL 300 mg capsule         . rosuvastatin (CRESTOR) 10 MG tablet   Oral   Take 1 tablet (10 mg total) by mouth at bedtime.   30 tablet   2     I had cancelled zocor and started Crestor in Jan   . tolterodine (DETROL LA) 4 MG 24 hr capsule   Oral   Take 1 capsule (4 mg total) by mouth daily.   30 capsule   1     BP 137/61  Pulse 77  Temp(Src) 98.8 F (37.1 C) (Oral)  Resp 18  SpO2 95%  Physical Exam  Nursing note and vitals reviewed. Constitutional: She is oriented to person, place, and time. She appears well-developed and well-nourished. No distress.  HENT:  Head: Normocephalic.  Cardiovascular: Normal rate and regular rhythm.   Pulmonary/Chest: Effort normal and breath sounds normal. No respiratory distress.  Abdominal: Soft.  Musculoskeletal: She exhibits edema and tenderness.  Slightly reduced range of motion of right knee secondary to pain. There is mild swelling and diffuse tenderness over the anterior aspect of the knee. Negative anterior posterior drawer test. No increased laxity with valgus rare stress. Normal distal dorsal pedal pulses and sensation in the foot. Skin normal without erythema or increased warmth.   Neurological: She is alert and oriented to person, place, and time.  Skin: Skin is warm and dry. No rash noted.  Psychiatric: She has a normal mood and affect. Her behavior is normal.    ED Course  Procedures      Ct Knee Right Wo Contrast  11/30/2012  *RADIOLOGY REPORT*  Clinical Data: Larey Seat.  Pain is worse with movement.  No redness or swelling.  Full range of motion.  History of diabetes, stents.  CT OF THE RIGHT KNEE WITHOUT CONTRAST  Technique:   Multidetector CT imaging was performed according to the standard protocol. Multiplanar CT image reconstructions were also generated.  Comparison: Plain films 11/29/2012  Findings: There are significant degenerative changes involving the medial, lateral, and patellofemoral compartments.  No joint effusion present.  Irregularity seen on the plain films along  the medial and lateral aspects of the proximal tibia are related to degenerative changes.  No evidence for acute fracture or subluxation.  Distal aspect of femoral stent is visible.  IMPRESSION:  1.  Significant degenerative changes. 2.  No evidence for acute fracture or joint effusion.   Original Report Authenticated By: Norva Pavlov, M.D.    Dg Knee Complete 4 Views Right  11/29/2012  *RADIOLOGY REPORT*  Clinical Data: Fall.  Knee pain.  Prior femoral stent.  RIGHT KNEE - COMPLETE 4+ VIEW  Comparison: 03/17/2010  Findings: Distal aspect of the femoral stent is visible.  Surgical clips overlie the medial aspect of the knee.  There are degenerative changes involving the medial and patellofemoral compartments.  No definite joint effusion.  Along the lateral aspect of the lateral tibial plateau, there is mild irregularity.  A similar irregularity is identified along the medial aspect of the proximal tibia.  The findings raise the question of tibial plateau fractures.  Alternatively, findings could be related to degenerative changes.  IMPRESSION:  1.  Degenerative changes. 2.  Subtle medial and or lateral tibial plateau fractures are possible.  See above.   Original Report Authenticated By: Norva Pavlov, M.D.      1. Knee pain, acute, right       MDM  Patient seen and evaluated. Patient appears well in no acute distress. Slightly reduced range of motion of right knee with mild swelling and tenderness.   Patient is ambulatory with her cane. CT scan does not show tibial plateau fracture. Will place patient in knee brace have her continue to  followup with PCP.     Angus Seller, PA-C 11/30/12 0600

## 2012-11-29 NOTE — ED Notes (Signed)
Per EMS: Pt fell 0900 this AM on right knee. Reports 9/10 right knee pain, worse with movement. No redness/swelling noted. Full ROM. Pt AO x 4. 136/74. 86 bpm. 97% RA. 141 BG. Hx: diabetic, HTN, MI, 3 stents.

## 2012-11-30 NOTE — ED Provider Notes (Signed)
Medical screening examination/treatment/procedure(s) were performed by non-physician practitioner and as supervising physician I was immediately available for consultation/collaboration.  Anthony T Allen, MD 11/30/12 1639 

## 2012-12-04 NOTE — Telephone Encounter (Signed)
CSW spoke with [name] regarding completing her Medicare application over the telephone with CSW.  Pt states she can stay after her scheduled [facility] appt to work on application.  CSW will meet with Vanessa Calderon after [facility] appt on 12/17/2012.  CSW encouraged pt to arrange transportation for pick up around 12:30pm.

## 2012-12-12 ENCOUNTER — Other Ambulatory Visit: Payer: Self-pay | Admitting: *Deleted

## 2012-12-12 DIAGNOSIS — E119 Type 2 diabetes mellitus without complications: Secondary | ICD-10-CM

## 2012-12-12 DIAGNOSIS — E663 Overweight: Secondary | ICD-10-CM

## 2012-12-12 DIAGNOSIS — I10 Essential (primary) hypertension: Secondary | ICD-10-CM

## 2012-12-12 DIAGNOSIS — I2510 Atherosclerotic heart disease of native coronary artery without angina pectoris: Secondary | ICD-10-CM

## 2012-12-12 NOTE — Telephone Encounter (Signed)
Fax from [facility] - requesting 90 day supply. Pt has changed pharmacy.

## 2012-12-13 MED ORDER — ROSUVASTATIN CALCIUM 10 MG PO TABS: 10.0000 mg | ORAL_TABLET | Freq: Every day | ORAL | Status: DC

## 2012-12-13 MED ORDER — METOPROLOL SUCCINATE ER 25 MG PO TB24: 25.0000 mg | ORAL_TABLET | Freq: Every day | ORAL | Status: DC

## 2012-12-13 MED ORDER — METFORMIN HCL 500 MG PO TABS: 500.0000 mg | ORAL_TABLET | Freq: Two times a day (BID) | ORAL | Status: DC

## 2012-12-13 MED ORDER — LISINOPRIL 10 MG PO TABS: ORAL_TABLET | ORAL | Status: DC

## 2012-12-13 MED ORDER — AMLODIPINE BESYLATE 10 MG PO TABS: 10.0000 mg | ORAL_TABLET | Freq: Every day | ORAL | Status: DC

## 2012-12-13 NOTE — Telephone Encounter (Signed)
Has appt 12/17/12 with me. 3 month supply

## 2012-12-17 ENCOUNTER — Ambulatory Visit (INDEPENDENT_AMBULATORY_CARE_PROVIDER_SITE_OTHER): Payer: BC Managed Care – PPO | Admitting: Dietician

## 2012-12-17 ENCOUNTER — Ambulatory Visit (INDEPENDENT_AMBULATORY_CARE_PROVIDER_SITE_OTHER): Payer: BC Managed Care – PPO | Admitting: Internal Medicine

## 2012-12-17 ENCOUNTER — Other Ambulatory Visit: Payer: Self-pay | Admitting: Dietician

## 2012-12-17 ENCOUNTER — Encounter: Payer: Self-pay | Admitting: Internal Medicine

## 2012-12-17 ENCOUNTER — Encounter: Payer: Self-pay | Admitting: Licensed Clinical Social Worker

## 2012-12-17 VITALS — BP 137/74 | HR 75 | Temp 98.8°F | Ht 66.0 in | Wt 183.3 lb

## 2012-12-17 DIAGNOSIS — I6350 Cerebral infarction due to unspecified occlusion or stenosis of unspecified cerebral artery: Secondary | ICD-10-CM

## 2012-12-17 DIAGNOSIS — R32 Unspecified urinary incontinence: Secondary | ICD-10-CM

## 2012-12-17 DIAGNOSIS — Z742 Need for assistance at home and no other household member able to render care: Secondary | ICD-10-CM

## 2012-12-17 DIAGNOSIS — E119 Type 2 diabetes mellitus without complications: Secondary | ICD-10-CM

## 2012-12-17 DIAGNOSIS — I10 Essential (primary) hypertension: Secondary | ICD-10-CM

## 2012-12-17 DIAGNOSIS — G894 Chronic pain syndrome: Secondary | ICD-10-CM | POA: Insufficient documentation

## 2012-12-17 DIAGNOSIS — Z6825 Body mass index (BMI) 25.0-25.9, adult: Secondary | ICD-10-CM

## 2012-12-17 DIAGNOSIS — R269 Unspecified abnormalities of gait and mobility: Secondary | ICD-10-CM

## 2012-12-17 DIAGNOSIS — Z0000 Encounter for general adult medical examination without abnormal findings: Secondary | ICD-10-CM

## 2012-12-17 DIAGNOSIS — E785 Hyperlipidemia, unspecified: Secondary | ICD-10-CM

## 2012-12-17 DIAGNOSIS — R351 Nocturia: Secondary | ICD-10-CM

## 2012-12-17 DIAGNOSIS — F17200 Nicotine dependence, unspecified, uncomplicated: Secondary | ICD-10-CM

## 2012-12-17 DIAGNOSIS — I2510 Atherosclerotic heart disease of native coronary artery without angina pectoris: Secondary | ICD-10-CM

## 2012-12-17 DIAGNOSIS — E663 Overweight: Secondary | ICD-10-CM

## 2012-12-17 DIAGNOSIS — Z720 Tobacco use: Secondary | ICD-10-CM

## 2012-12-17 LAB — GLUCOSE, CAPILLARY: Glucose-Capillary: 169 mg/dL — ABNORMAL HIGH (ref 70–99)

## 2012-12-17 MED ORDER — LANCETS 30G MISC: 1.0000 | Freq: Two times a day (BID) | Status: DC

## 2012-12-17 MED ORDER — TOLTERODINE TARTRATE ER 4 MG PO CP24: 4.0000 mg | ORAL_CAPSULE | Freq: Every day | ORAL | Status: DC

## 2012-12-17 MED ORDER — GLUCOSE BLOOD VI STRP: ORAL_STRIP | Status: DC

## 2012-12-17 MED ORDER — BAYER CONTOUR NEXT EZ W/DEVICE KIT: 1.0000 | PACK | Freq: Two times a day (BID) | Status: DC

## 2012-12-17 NOTE — Assessment & Plan Note (Signed)
She fell last week when trying to stand up from toilet. She went to ER. Plain films and CT were negative. She still has pain. No more falls. ROM OK. Some crepitus. She reports she has worked with PT regarding gait and doesn't think another round will be beneficial. My goal is to get [facility] involved and really impact her social situation.

## 2012-12-17 NOTE — Assessment & Plan Note (Signed)
Asymptomatic. RF mgmt with ASA, BB, statin. Trying to control DM, improve diet, etc.

## 2012-12-17 NOTE — Assessment & Plan Note (Signed)
  Assessment:  Progress toward smoking cessation:  smoking less  Barriers to progress toward smoking cessation:  cost of medications;stress  Comments: See below  Plan:  Instruction/counseling given:  I advised patient to stop smoking.  Educational resources provided:  smoking cessation handout (tips, strategies, fact sheets)  Self management tools provided:  smoking cessation plan (STAR Quit Plan)  Medications to assist with smoking cessation:  none Patient agreed to the following self-care plans for smoking cessation:   (PATIENT SAYS SHE QUITING ON HER OWN)   Other: Pt states cutting down. At 1/2 PPD. There are so many issues that this is not taking precedent today.

## 2012-12-17 NOTE — Progress Notes (Signed)
  Subjective:    Patient ID: Vanessa Calderon, female    DOB: Dec 07, 1945, 67 y.o.   MRN: [id number]  HPI  Please see the A&P for the status of the pt's chronic medical problems.   Review of Systems  Constitutional: Negative for activity change, appetite change and unexpected weight change.  HENT: Negative for sore throat and trouble swallowing.   Respiratory: Positive for cough and shortness of breath. Negative for choking and chest tightness.   Cardiovascular: Negative for chest pain and leg swelling.  Endocrine: Positive for polyuria.  Genitourinary: Positive for urgency. Negative for decreased urine volume.  Musculoskeletal: Positive for joint swelling, arthralgias and gait problem.       Objective:   Physical Exam  Constitutional: She is oriented to person, place, and time. She appears well-developed and well-nourished. No distress.  HENT:  Head: Normocephalic and atraumatic.  Right Ear: External ear normal.  Left Ear: External ear normal.  Nose: Nose normal.  Eyes: Conjunctivae and EOM are normal. Right eye exhibits no discharge. Left eye exhibits no discharge. No scleral icterus.  Neck: Normal range of motion. No tracheal deviation present. No thyromegaly present.  Cardiovascular: Normal rate, regular rhythm and normal heart sounds.   Pulmonary/Chest: Effort normal and breath sounds normal.  No wheeze. Some upper airway noise in L base. Clear overall. Good air movement  Musculoskeletal: Normal range of motion. She exhibits no edema.  Neurological: She is alert and oriented to person, place, and time.  Skin: Skin is warm and dry. She is not diaphoretic.  Psychiatric: She has a normal mood and affect. Her behavior is normal. Judgment and thought content normal.          Assessment & Plan:

## 2012-12-17 NOTE — Patient Instructions (Addendum)
General Instructions:  1. Cont Advil for your knee and shoulder. 2. Try adding tylenol to the Advil for one week. 3. Call me if no better and I will try something else but will also ask that you go to PT 4. Lupita Leash and [name] will also meet with you today 5. The bladder pill is at [facility]  Treatment Goals:  Goals (1 Years of Data) as of 12/17/12   None      Progress Toward Treatment Goals:  Treatment Goal 12/17/2012  Hemoglobin A1C unchanged  Blood pressure at goal  Stop smoking smoking less    Self Care Goals & Plans:  Self Care Goal 12/17/2012  Monitor my health keep track of my blood glucose; bring my glucose meter and log to each visit  Eat healthy foods eat baked foods instead of fried foods  Be physically active (No Data)  Stop smoking (No Data)    Home Blood Glucose Monitoring 12/17/2012  Check my blood sugar (No Data)  When to check my blood sugar -     Care Management & Community Referrals:  Referral 12/17/2012  Referrals made for care management support nutritionist; diabetes educator; social worker  Referrals made to community resources -

## 2012-12-17 NOTE — Assessment & Plan Note (Signed)
This is clearly related to diet. She is having trouble eating healthy 2/2 transportation issues, her mobility (can't stand to cook), and financial issues. [name] helping pt today.

## 2012-12-17 NOTE — Progress Notes (Signed)
Medical Nutrition Therapy:  Appt start time: 1030 end time:  1100.  Assessment:  Primary concerns today: Blood sugar control and needs meter and testing supplies.   Usual eating pattern includes 1 meals and 2-3 snacks per day. Note  weight loss of ~ 10 # over past year prior to increase in a1C.  Usual physical activity includes ADLs. Walks with cane limited by stroke  denies difficulty swallowing, some difficulty chewing hard and tough foods.  Frequent foods include cakes, candy and cookies.   24-hr recall: (Up at  7-8 AM) B ( 10 AM)-  MEALS ON WHEELS Snks ( not set times) CANDY, CHIPS, cakes and pies    Progress Towards Goal(s):  In progress.   Nutritional Diagnosis:  NB-1.1 lnutrition related knowledge deficit as related to lack of prior exposure to nutrition knowledge as evidenced by her report. Of  thinking to eat meals and healthier choices she had to cook.  NB-3.3 Limited access to nutrition-related supplies a glucometer as related to recent acute elevation in a1C As evidenced by her report of having meter byt no testing supplies( think [employer] was not covering [employer] contour and now that she has Medicare B she should not have that problem.    Intervention:  Nutrition education about healthier foods patient could eat that she would not have to prepare. She think friend will buy on her request. Nutrition education about importance of self monitoring  Supplies- meter provided and patient demonstrated ability to use. She request rx for supplies be sent to her drug store for twice a day testing. Coordination of care- request rx for testing supplies, discuss with social work home situation  Monitoring/Evaluation:  Dietary intake, exercise, meter/blood sugar, and body weight in 4 week(s).

## 2012-12-17 NOTE — Assessment & Plan Note (Signed)
Lab Results  Component Value Date   HGBA1C 12.3 10/08/2012   HGBA1C 6.7 06/25/2012   HGBA1C 6.0 03/26/2012     Assessment:  Diabetes control: poor control (HgbA1C >9%)  Progress toward A1C goal:  unchanged  Comments: See below  Plan:  Medications:  continue current medications  Home glucose monitoring:   Frequency:  (Not checking)   Timing:    Instruction/counseling given: other instruction/counseling: To visit with Vanessa Calderon  Educational resources provided: brochure  Self management tools provided: instructions for home glucose monitoring  Other plans: I had resumed her metformin at last visit 2/2 hyperglycemia. Too early to recheck A1C. She cont to get meals on wheels but that is one meal only. Eats cookies and cakes otherwise. Vanessa Calderon is working on resources as that needs put in place prior to any chance of getting better CBG control.

## 2012-12-17 NOTE — Assessment & Plan Note (Signed)
BP Readings from Last 3 Encounters:  12/17/12 137/74  11/30/12 136/65  10/08/12 133/75    Lab Results  Component Value Date   NA 140 10/08/2012   K 4.2 10/08/2012   CREATININE 0.73 10/08/2012    Assessment:  Blood pressure control: controlled  Progress toward BP goal:  at goal  Comments: See below  Plan:  Medications:  continue current medications  Educational resources provided: brochure  Self management tools provided: home blood pressure logbook  Other plans: BP is at goal. Cont meds

## 2012-12-17 NOTE — Assessment & Plan Note (Signed)
She was changed to Crestor 1/14. Too early to recheck as I do not think she changed immediately. Will check next visit.

## 2012-12-17 NOTE — Patient Instructions (Addendum)
Please try to eat 3 times a day   10 am meals on wheels meal   3 pM- sandwich -  peanutbutter or low fat lunch meat or tuna   8 Pm- yogurt or fruit or PLAIN CHEERIOS with 1/2 banana & 1% milk  Try to eat hard boiled eggs, nuts, fruit, vegetables and yogurt instead of cakes, cookies, pies and chips  Please bring meter to all visits.

## 2012-12-17 NOTE — Assessment & Plan Note (Signed)
On ASA 

## 2012-12-17 NOTE — Assessment & Plan Note (Signed)
See last note. To see [name] today.

## 2012-12-17 NOTE — Assessment & Plan Note (Signed)
She has tried to [employer] patch. Will  Try the pill. She has refused referral to uro / gyn in past.

## 2012-12-17 NOTE — Assessment & Plan Note (Signed)
She has R knee pain since the fall. There is crepitus on exam but good ROM.  Also L shoulder pain for 5 months. No plain films (has had or R shoulder). No injury. Pt states has seen PT and doesn't want to return. ROM OK but pain with over the head movement. Using advil and helps but then wears off. Renal fxn OK. Add tylenol. She is to call if no better. I would consider changing Advil to Mobic since it is longer acting and would again stress PT or PMR since prior CVA.

## 2012-12-17 NOTE — Progress Notes (Signed)
CSW had planned to meet with Ms. Mitzie Na today and complete Medicare application.  However, Ms. Mitzie Na states she received the card in the mail.  Pt showed CSW red/white/blue traditional Medicare card, with part A starting in Nov 2013 and part B starting April 2014.  CSW and pt discussed [facility] referral.  Pt in agreement and reiterating to CSW that she does not talk well over the phone.  CSW informed Ms. Mitzie Na will pass that along on the referral.  Pt denies add'l needs at this time and is aware CSW is available to assist as needed.

## 2012-12-17 NOTE — Assessment & Plan Note (Signed)
She refused pneumovax.

## 2013-01-14 ENCOUNTER — Ambulatory Visit (INDEPENDENT_AMBULATORY_CARE_PROVIDER_SITE_OTHER): Payer: BC Managed Care – PPO | Admitting: Internal Medicine

## 2013-01-14 ENCOUNTER — Encounter: Payer: Self-pay | Admitting: Internal Medicine

## 2013-01-14 ENCOUNTER — Ambulatory Visit (INDEPENDENT_AMBULATORY_CARE_PROVIDER_SITE_OTHER): Payer: Medicare Other | Admitting: Dietician

## 2013-01-14 VITALS — BP 130/80 | HR 82 | Temp 98.6°F | Ht 66.0 in | Wt 188.5 lb

## 2013-01-14 VITALS — Wt 188.5 lb

## 2013-01-14 DIAGNOSIS — I69959 Hemiplegia and hemiparesis following unspecified cerebrovascular disease affecting unspecified side: Secondary | ICD-10-CM

## 2013-01-14 DIAGNOSIS — F17200 Nicotine dependence, unspecified, uncomplicated: Secondary | ICD-10-CM

## 2013-01-14 DIAGNOSIS — E119 Type 2 diabetes mellitus without complications: Secondary | ICD-10-CM

## 2013-01-14 DIAGNOSIS — I6350 Cerebral infarction due to unspecified occlusion or stenosis of unspecified cerebral artery: Secondary | ICD-10-CM

## 2013-01-14 DIAGNOSIS — I10 Essential (primary) hypertension: Secondary | ICD-10-CM

## 2013-01-14 DIAGNOSIS — R351 Nocturia: Secondary | ICD-10-CM

## 2013-01-14 DIAGNOSIS — I2510 Atherosclerotic heart disease of native coronary artery without angina pectoris: Secondary | ICD-10-CM

## 2013-01-14 DIAGNOSIS — R32 Unspecified urinary incontinence: Secondary | ICD-10-CM

## 2013-01-14 DIAGNOSIS — Z720 Tobacco use: Secondary | ICD-10-CM

## 2013-01-14 LAB — POCT GLYCOSYLATED HEMOGLOBIN (HGB A1C): Hemoglobin A1C: 7.8

## 2013-01-14 MED ORDER — ASPIRIN 325 MG PO TABS: 325.0000 mg | ORAL_TABLET | Freq: Every day | ORAL | Status: DC

## 2013-01-14 NOTE — Assessment & Plan Note (Signed)
Lab Results  Component Value Date   HGBA1C 7.8 01/14/2013   HGBA1C 12.3 10/08/2012   HGBA1C 6.7 06/25/2012     Assessment: Diabetes control: fair control Progress toward A1C goal:  improved Comments: Pt states cutting back on cookie and cakes.  Plan: Medications:  continue current medications Home glucose monitoring: Frequency: once a day Timing:   Instruction/counseling given: discussed diet Educational resources provided:   Self management tools provided:   Other plans:

## 2013-01-14 NOTE — Progress Notes (Signed)
Patient ID: Vanessa Calderon, female   DOB: 02-18-1946, 67 y.o.   MRN: [id number]  Subjective:   Patient ID: Vanessa Calderon female   DOB: 24-Jan-1946 67 y.o.   MRN: [id number]  HPI: Vanessa Calderon is a 67 y.o. female with PMH DMII, HTN, CAD, and CVAs presents to the clinic for a follow up.  She was seen by Dr. Rogelia Boga on 4/1 c/o pain in her R knee and L shoulder pain. She was prescribed Ibuprofen, which has improved her pain.   She was also changed from Detrol patch to the pill for her urinary incontinence, which she states is only at night. The Detrol po has improved her symptoms, but she is requesting a Urology referral.   She states that her diet has improved and she has cut back on cakes and cookies.  She is still smoking 1/2-1ppd. She has tried to quit in the past.   Past Medical History  Diagnosis Date  . CORONARY ARTERY DISEASE 05/15/2008    CABG 2006, DES to the saphenous vein graft to the ramus in December of 2006.  ECHO 2010 : basal inferior wall akinesis. Post-op septal wall dyssynchrony. Grade 1diastolic dysfunction.       [name] DIABETES MELLITUS, TYPE II 05/15/2008    Well controlled on oral meds.    [name] HYPERLIPIDEMIA 05/11/2008    Mgmt with statin    . HYPERTENSION 05/15/2008    Needs BB 2/2 CAD and ACEI 2/2 DM.     [name] Overweight (BMI 25.0-29.9) 03/27/2012  . Tobacco use 03/27/2012    She has tried Chantix in past with intolerable side effects.   [name] CVA 09/23/2009    Left CEA May 2006 at same time as CABG 2010 : Acute left hemisphere infarct of posterior limb internal capsule and lateral thalamus.  Prior CVA's but dates unknown, imaging shows widespread multifocal remote lacunar infarcts throughout the brain and brainstem.  MRA 2010 : no severe proximal flow limiting stenosis.    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 amLODipine (NORVASC) 10 MG tablet Take 1 tablet (10 mg total) by mouth daily.  90  tablet  0  . aspirin 325 MG tablet Take 1 tablet (325 mg total) by mouth daily.  30 tablet  1  . Blood Glucose Monitoring Suppl (CONTOUR NEXT EZ MONITOR) W/DEVICE KIT 1 each by Does not apply route 2 (two) times daily.  1 kit  0  . glucose blood (BAYER CONTOUR NEXT TEST) test strip TWICE A DAY TESTING, 250.02  100 each  12  . ibuprofen (ADVIL,MOTRIN) 100 MG tablet Take 100 mg by mouth 2 (two) times daily.      . Lancets 30G MISC 1 each by Does not apply route 2 (two) times daily. DX CODE 250.02,  100 each  5  . lisinopril (PRINIVIL,ZESTRIL) 10 MG tablet take 1 tablet by mouth once daily  90 tablet  0  . metFORMIN (GLUCOPHAGE) 500 MG tablet Take 1 tablet (500 mg total) by mouth 2 (two) times daily with a meal.  180 tablet  0  . metoprolol succinate (TOPROL-XL) 25 MG 24 hr tablet Take 1 tablet (25 mg total) by mouth daily.  90 tablet  0  . OVER THE COUNTER MEDICATION Take 1 capsule by mouth 3 (three) times daily. Omega XL 300 mg capsule      . rosuvastatin (CRESTOR) 10 MG tablet Take 1 tablet (10  mg total) by mouth at bedtime.  90 tablet  0  . tolterodine (DETROL LA) 4 MG 24 hr capsule Take 1 capsule (4 mg total) by mouth daily.  30 capsule  1  . [DISCONTINUED] tolterodine (DETROL LA) 4 MG 24 hr capsule Take 1 capsule (4 mg total) by mouth daily.  30 capsule  0   No current facility-administered medications for this visit.   No family history on file. History   Social History  . Marital Status: Single    Spouse Name: N/A    Number of Children: N/A  . Years of Education: N/A   Social History Main Topics  . Smoking status: Current Every Day Smoker -- 0.50 packs/day    Types: Cigarettes  . Smokeless tobacco: Current User     Comment: trying to cut back.  . Alcohol Use: No  . Drug Use: No  . Sexually Active: None   Other Topics Concern  . None   Social History Narrative   Pt grew up in [place]. Has daughter in [place]. Divorced. Lived in [place] about 2010 but then returned to [place]  for a couple of yrs and returned to [place] in 2013. No family here but has friends. Some gait instability since CVA but no freq falls. Decreased vision in L eye. Has license but no car. Lives in apartment independently.    Review of Systems: A 10 point ROS was performed; pertinent positives and negatives were noted in the HPI  Objective:  Physical Exam: Filed Vitals:   01/14/13 1026  BP: 130/80  Pulse: 82  Temp: 98.6 F (37 C)  TempSrc: Oral  Height: 5\' 6"  (1.676 m)  Weight: 188 lb 8 oz (85.503 kg)  SpO2: 98%   Constitutional: Vital signs reviewed.  Patient is a well-developed and well-nourished female in no acute distress and cooperative with exam. Head: Normocephalic and atraumatic Eyes: PERRL, EOMI, conjunctivae normal, No scleral icterus.  Neck: Supple, Trachea midline normal ROM Cardiovascular: RRR, S1 normal, S2 normal, no MRG, pulses symmetric and intact bilaterally Pulmonary/Chest: CTAB, no wheezes, rales, or rhonchi Abdominal: Soft. Non-tender, non-distended Musculoskeletal: No joint deformities, erythema Neurological: A&O x3 Skin: Warm, dry and intact. Psychiatric: Normal mood and affect.  Assessment & Plan:   Please refer to Problem List based Assessment and Plan

## 2013-01-14 NOTE — Patient Instructions (Addendum)
Get strips and needles at [facility]. Bring meter to your visits.  Make follow up in 2-3 months.   Eat less sausage- it has fat and salt and can increase your weight & blood sugar:Try 1 sausage link instead of two with egg  Eat Healthy snacks   15 almonds 3 celery sticks + 1 Tablespoon of peanut butter 5 baby carrots 5 cherry tomatoes + 1 Tablespoon ranch dressing 1 hard-boiled egg 1 cup cucumber slices + 1 Tablespoon ranch dressing  cup of fresh blueberries 1 cup of salad greens + 1/2 cup of diced cucumber + drizzle of vinegar and oil 1 frozen sugar-free popsicle 1 cup of light popcorn 2 saltine crackers 10 gold-fish crackers  cup sugar-free gelatin 1 piece of string cheese stick 8 green olives 2 Tablespoons pumpkin or sesame seeds  of a whole avocado (~4 g.) About 10-20 Grams of Carbohydrate   cup dried fruit and nut mix 1 cup chicken noodle soup, tomato soup (made with water),  or vegetable soup 1 small apple or orange 3 cups light popcorn 1/3 cup hummus + 1 cup raw fresh cut veggies (green peppers, carrots, broccoli, cucumber, celery, cauliflower or a combination of these)  cup cottage cheese +  cup canned or fresh fruit 1 cheese quesadilla (made with one 6-inch corn or whole wheat tortilla + 1 oz shredded cheese) +  cup salsa 2 rice cakes (with a 4-inch diameter) + 1 Tablespoon peanut butter 5 whole wheat crackers (or  oz) + 1 piece of string cheese  [place] sandwich (1 slice whole wheat bread + 2 oz [place] + mustard)  cup tuna salad + 4 saltines

## 2013-01-14 NOTE — Progress Notes (Signed)
Medical Nutrition Therapy:  Appt start time: 930 end time:  1015.  Assessment:  Primary concerns today: Blood sugar control and needs meter and testing supplies.   Usual eating pattern includes 2 meal and 1-2 snacks per day. weight increase 5.2# since last visit.A1C 7.8% today. Patient did not bring meter, reports CBGs were 197 and 200s fasting at home, stopped testing last week when she ran out of strips. Now has Medicare- could consider Silver Sneakers.   Frequent foods include cakes, candy and cookies.   24-hr recall: (Up at  7-8 AM) B (10 AM)-  MEALS ON WHEELS x5 days and  Sausage x2,  Egg,  Toast x 2 days Snks ( not set times) CANDY, CHIPS, cakes and pies 5-6 PM- cereal and low fat milk or peanut butter & jelly sandwich   Progress Towards Goal(s):  In progress.   Nutritional Diagnosis:  NB-1.1 lnutrition related knowledge deficit as related to lack of prior exposure to nutrition knowledge as evidenced by her report. Of  thinking to eat meals and healthier choices she had to cook.  NB-3.3 Limited access to nutrition-related supplies a glucometer as related to recent acute elevation in a1C As evidenced by her report of having meter but no testing supplies     Intervention:  Nutrition education about healthier foods patient could eat that she would not have to prepare.  Medication:  patient demonstrated ability to use insulin pen. She was encouraged to get supplies for meter at [facility] And bring meter to all visits   Monitoring/Evaluation:  Dietary intake, exercise, meter/blood sugar, and body weight in 8-12 week(s).

## 2013-01-14 NOTE — Patient Instructions (Addendum)
**  Continue to take all of your medications as prescribed. Be sure to restart the Aspirin; this is especially important given your history of a stroke. I have sent a new prescription for Aspirin to your pharmacy.  **I will refer you to Urology for your urinary incontinence.  **Follow up in 3 months with Dr. Teressa Lower Can Quit Smoking If you are ready to quit smoking or are thinking about it, congratulations! You have chosen to help yourself be healthier and live longer! There are lots of different ways to quit smoking. Nicotine gum, nicotine patches, a nicotine inhaler, or nicotine nasal spray can help with physical craving. Hypnosis, support groups, and medicines help break the habit of smoking. TIPS TO GET OFF AND STAY OFF CIGARETTES  Learn to predict your moods. Do not let a bad situation be your excuse to have a cigarette. Some situations in your life might tempt you to have a cigarette.  Ask friends and co-workers not to smoke around you.  Make your home smoke-free.  Never have "just one" cigarette. It leads to wanting another and another. Remind yourself of your decision to quit.  On a card, make a list of your reasons for not smoking. Read it at least the same number of times a day as you have a cigarette. Tell yourself everyday, "I do not want to smoke. I choose not to smoke."  Ask someone at home or work to help you with your plan to quit smoking.  Have something planned after you eat or have a cup of coffee. Take a walk or get other exercise to perk you up. This will help to keep you from overeating.  Try a relaxation exercise to calm you down and decrease your stress. Remember, you may be tense and nervous the first two weeks after you quit. This will pass.  Find new activities to keep your hands busy. Play with a pen, coin, or rubber band. Doodle or draw things on paper.  Brush your teeth right after eating. This will help cut down the craving for the taste of tobacco after  meals. You can try mouthwash too.  Try gum, breath mints, or diet candy to keep something in your mouth. IF YOU SMOKE AND WANT TO QUIT:  Do not stock up on cigarettes. Never buy a carton. Wait until one pack is finished before you buy another.  Never carry cigarettes with you at work or at home.  Keep cigarettes as far away from you as possible. Leave them with someone else.  Never carry matches or a lighter with you.  Ask yourself, "Do I need this cigarette or is this just a reflex?"  Bet with someone that you can quit. Put cigarette money in a piggy bank every morning. If you smoke, you give up the money. If you do not smoke, by the end of the week, you keep the money.  Keep trying. It takes 21 days to change a habit!  Talk to your doctor about using medicines to help you quit. These include nicotine replacement gum, lozenges, or skin patches. Document Released: 07/01/2009 Document Revised: 11/27/2011 Document Reviewed: 07/01/2009 [facility] Patient Information 2013 [place], [place].

## 2013-01-14 NOTE — Assessment & Plan Note (Signed)
  Assessment: Progress toward smoking cessation:  smoking the same amount Barriers to progress toward smoking cessation:  lack of motivation to quit Comments: Tried to quit but had trouble and today does not seem motivated.  Plan: Instruction/counseling given:  I counseled patient on the dangers of tobacco use and advised patient to stop smoking. Educational resources provided:  [facility] [employer]) brochure Self management tools provided:  smoking cessation plan (STAR Quit Plan) Medications to assist with smoking cessation:  None Patient agreed to the following self-care plans for smoking cessation: call [facility] (1-800-QUIT-NOW);cut down the number of cigarettes smoked

## 2013-01-14 NOTE — Assessment & Plan Note (Signed)
BP Readings from Last 3 Encounters:  01/14/13 130/80  12/17/12 137/74  11/30/12 136/65    Lab Results  Component Value Date   NA 140 10/08/2012   K 4.2 10/08/2012   CREATININE 0.73 10/08/2012    Assessment: Blood pressure control: controlled Progress toward BP goal:  at goal Comments:   Plan: Medications:  continue current medications Educational resources provided:   Self management tools provided:   Other plans:

## 2013-01-14 NOTE — Assessment & Plan Note (Addendum)
Pt given Detrol LA 4mg  po at last visit. Urinary incontinece improved somewhat per pt, but still present. Pt requesting Urology referral, so will refer.  - Contionue Detrol - Urology referral

## 2013-01-14 NOTE — Assessment & Plan Note (Signed)
She states that she has not been taking her ASA for 3-4 months b/c she ran out of the medication. Discussed the importance of taking ASA, and will refill her prescription.

## 2013-01-16 HISTORY — PX: CYSTOSCOPY: SUR368

## 2013-01-20 NOTE — Progress Notes (Signed)
Case discussed with Dr. Kathryn Glenn at the time of the visit, immediately after the resident saw the patient.  I reviewed the resident's history and exam and pertinent patient test results.  I agree with the assessment, diagnosis and plan of care documented in the resident's note.     

## 2013-02-12 ENCOUNTER — Other Ambulatory Visit: Payer: Self-pay | Admitting: Internal Medicine

## 2013-02-12 DIAGNOSIS — R351 Nocturia: Secondary | ICD-10-CM

## 2013-02-12 DIAGNOSIS — E119 Type 2 diabetes mellitus without complications: Secondary | ICD-10-CM

## 2013-02-12 MED ORDER — GLUCOSE BLOOD VI STRP: ORAL_STRIP | Status: DC

## 2013-02-12 MED ORDER — TOLTERODINE TARTRATE ER 4 MG PO CP24: 4.0000 mg | ORAL_CAPSULE | Freq: Every day | ORAL | Status: DC

## 2013-02-18 ENCOUNTER — Encounter: Payer: Self-pay | Admitting: Internal Medicine

## 2013-02-20 ENCOUNTER — Encounter: Payer: Self-pay | Admitting: Internal Medicine

## 2013-02-20 NOTE — Progress Notes (Signed)
This encounter was created in error - please disregard.

## 2013-03-04 ENCOUNTER — Ambulatory Visit (INDEPENDENT_AMBULATORY_CARE_PROVIDER_SITE_OTHER): Payer: Medicare Other | Admitting: Internal Medicine

## 2013-03-04 ENCOUNTER — Encounter: Payer: Self-pay | Admitting: Internal Medicine

## 2013-03-04 VITALS — BP 131/76 | HR 61 | Temp 99.0°F | Ht 66.0 in | Wt 185.5 lb

## 2013-03-04 DIAGNOSIS — I10 Essential (primary) hypertension: Secondary | ICD-10-CM

## 2013-03-04 DIAGNOSIS — I2510 Atherosclerotic heart disease of native coronary artery without angina pectoris: Secondary | ICD-10-CM

## 2013-03-04 DIAGNOSIS — Z6825 Body mass index (BMI) 25.0-25.9, adult: Secondary | ICD-10-CM

## 2013-03-04 DIAGNOSIS — Z0000 Encounter for general adult medical examination without abnormal findings: Secondary | ICD-10-CM

## 2013-03-04 DIAGNOSIS — E119 Type 2 diabetes mellitus without complications: Secondary | ICD-10-CM

## 2013-03-04 DIAGNOSIS — R32 Unspecified urinary incontinence: Secondary | ICD-10-CM

## 2013-03-04 DIAGNOSIS — R269 Unspecified abnormalities of gait and mobility: Secondary | ICD-10-CM

## 2013-03-04 DIAGNOSIS — I6350 Cerebral infarction due to unspecified occlusion or stenosis of unspecified cerebral artery: Secondary | ICD-10-CM

## 2013-03-04 DIAGNOSIS — G894 Chronic pain syndrome: Secondary | ICD-10-CM

## 2013-03-04 DIAGNOSIS — R351 Nocturia: Secondary | ICD-10-CM

## 2013-03-04 DIAGNOSIS — E785 Hyperlipidemia, unspecified: Secondary | ICD-10-CM

## 2013-03-04 DIAGNOSIS — Z742 Need for assistance at home and no other household member able to render care: Secondary | ICD-10-CM

## 2013-03-04 DIAGNOSIS — E663 Overweight: Secondary | ICD-10-CM

## 2013-03-04 LAB — GLUCOSE, CAPILLARY: Glucose-Capillary: 160 mg/dL — ABNORMAL HIGH (ref 70–99)

## 2013-03-04 MED ORDER — LISINOPRIL 10 MG PO TABS: ORAL_TABLET | ORAL | Status: DC

## 2013-03-04 MED ORDER — AMLODIPINE BESYLATE 10 MG PO TABS: 10.0000 mg | ORAL_TABLET | Freq: Every day | ORAL | Status: DC

## 2013-03-04 MED ORDER — TOLTERODINE TARTRATE ER 4 MG PO CP24: 4.0000 mg | ORAL_CAPSULE | Freq: Every day | ORAL | Status: DC

## 2013-03-04 MED ORDER — ROSUVASTATIN CALCIUM 10 MG PO TABS: 10.0000 mg | ORAL_TABLET | Freq: Every day | ORAL | Status: DC

## 2013-03-04 MED ORDER — METOPROLOL SUCCINATE ER 25 MG PO TB24: 25.0000 mg | ORAL_TABLET | Freq: Every day | ORAL | Status: DC

## 2013-03-04 MED ORDER — METFORMIN HCL 500 MG PO TABS: 500.0000 mg | ORAL_TABLET | Freq: Two times a day (BID) | ORAL | Status: DC

## 2013-03-04 NOTE — Patient Instructions (Addendum)
General Instructions:  1. I will let you know results of eye test 2. Let me know if you have any problems.  Treatment Goals:  Goals (1 Years of Data) as of 03/04/13     Lifestyle    . Prevent Falls       Progress Toward Treatment Goals:  Treatment Goal 03/04/2013  Hemoglobin A1C at goal  Blood pressure at goal  Stop smoking smoking less  Prevent falls unchanged    Self Care Goals & Plans:  Self Care Goal 03/04/2013  Manage my medications take my medicines as prescribed; refill my medications on time  Monitor my health keep track of my blood glucose; bring my glucose meter and log to each visit; keep track of my blood pressure; bring my blood pressure log to each visit; check my feet daily  Eat healthy foods drink diet soda or water instead of juice or soda; eat foods that are low in salt; eat baked foods instead of fried foods  Be physically active take a walk every day  Stop smoking call [facility] (1-800-QUIT-NOW)    Home Blood Glucose Monitoring 03/04/2013  Check my blood sugar 2 times a day  When to check my blood sugar before breakfast; before dinner     Care Management & Community Referrals:  Referral 03/04/2013  Referrals made for care management support none needed  Referrals made to community resources -

## 2013-03-05 NOTE — Assessment & Plan Note (Signed)
She looked amazing today. She had on new outfit that she had gotten while shopping with her friend. New hair cut. Not distressed. Need to check but I do think that [facility] is involved. Did not want SCAT - states girlfriend can take her to appts, shopping, errands, etc.

## 2013-03-05 NOTE — Progress Notes (Signed)
  Subjective:    Patient ID: Vanessa Calderon, female    DOB: 06-Oct-1945, 67 y.o.   MRN: [id number]  Diabetes Pertinent negatives for hypoglycemia include no headaches. Pertinent negatives for diabetes include no chest pain.    Please see the A&P for the status of the pt's chronic medical problems.   Review of Systems  Constitutional: Negative for unexpected weight change.  HENT: Positive for dental problem. Negative for trouble swallowing.   Eyes: Negative for itching.  Respiratory: Negative for shortness of breath.   Cardiovascular: Negative for chest pain and leg swelling.  Gastrointestinal: Negative for nausea, abdominal pain, diarrhea and constipation.  Genitourinary: Negative for urgency.       + nocturia 3-4 times  Musculoskeletal: Positive for arthralgias.  Skin: Negative for rash.  Neurological: Negative for headaches.  Psychiatric/Behavioral: Positive for sleep disturbance.       Objective:   Physical Exam  Constitutional: She is oriented to person, place, and time. She appears well-developed and well-nourished. No distress.  HENT:  Head: Normocephalic and atraumatic.  Right Ear: External ear normal.  Left Ear: External ear normal.  Nose: Nose normal.  Mouth/Throat: Oropharynx is clear and moist. No oropharyngeal exudate.  Eyes: Conjunctivae and EOM are normal. Right eye exhibits no discharge. Left eye exhibits no discharge. No scleral icterus.  Neck: Normal range of motion. Neck supple. No thyromegaly present.  Cardiovascular: Normal rate, regular rhythm and normal heart sounds.   Pulmonary/Chest: Effort normal and breath sounds normal.  Abdominal: Soft. Bowel sounds are normal.  Musculoskeletal: Normal range of motion. She exhibits no edema and no tenderness.  Lymphadenopathy:    She has no cervical adenopathy.  Neurological: She is alert and oriented to person, place, and time.  Skin: Skin is warm and dry. She is not diaphoretic.  Psychiatric: She has a normal  mood and affect. Her behavior is normal. Judgment and thought content normal.          Assessment & Plan:

## 2013-03-05 NOTE — Assessment & Plan Note (Signed)
Lab Results  Component Value Date   HGBA1C 7.8 01/14/2013   HGBA1C 12.3 10/08/2012   HGBA1C 6.7 06/25/2012     Assessment: Diabetes control: fair control Progress toward A1C goal:  at goal Comments: See below  Plan: Medications:  continue current medications Home glucose monitoring: Frequency: 2 times a day Timing: before breakfast;before dinner Instruction/counseling given: reminded to get eye exam Educational resources provided:   Self management tools provided:   Other plans: Her A1C has made sig improvements back on metformin and after seeing Tobey Bride that she states helped. She has decreased her sweet intake and more fruits. She states she has reliable access to food. Not checking CBG's as no test strips at home. Since A1C not yet steady, I am OK with her checking CBG's at home for now. Didn't want to see optho so retinal scan today. Foot exam today.

## 2013-03-05 NOTE — Assessment & Plan Note (Signed)
She mentioned in passing her L shoulder and R leg pain but we did not discuss in depth as not as issue for her today.

## 2013-03-05 NOTE — Assessment & Plan Note (Signed)
BP Readings from Last 3 Encounters:  03/04/13 131/76  01/14/13 130/80  12/17/12 137/74    Lab Results  Component Value Date   NA 140 10/08/2012   K 4.2 10/08/2012   CREATININE 0.73 10/08/2012    Assessment: Blood pressure control: controlled Progress toward BP goal:  at goal Comments: At goal  Plan: Medications:  continue current medications Educational resources provided:   Self management tools provided: home blood pressure logbook Other plans: She is at goal on her Lisinopril, norvasc, and toprol XL. Cont all.

## 2013-03-05 NOTE — Assessment & Plan Note (Signed)
Will need to get FLP next visit as I had changed to Crestor to decrease LDL to goal.

## 2013-03-05 NOTE — Assessment & Plan Note (Signed)
Wt on slow decline over the past 5 yrs from 220 to 185 lbs!

## 2013-03-05 NOTE — Assessment & Plan Note (Signed)
Remains on ASA, BP control, and statin.

## 2013-03-05 NOTE — Assessment & Plan Note (Signed)
She declined pneumococcal, MMG, colon today.

## 2013-03-05 NOTE — Assessment & Plan Note (Signed)
The Detrol has helped during the day but still nocturia. Has appt 19th

## 2013-03-05 NOTE — Assessment & Plan Note (Signed)
Asymptomatic. RF mgmt with ASA, BB, statin. Trying to control DM, improve diet, etc. Has made improvements in her diet. BP well controlled. LDL almost at goal on statin.

## 2013-03-05 NOTE — Assessment & Plan Note (Signed)
She has had two falls. One hurt her R knee. But she has not decreased her activity level. Goes shopping with her girlfriend. Will cont to monitor for now.

## 2013-03-27 ENCOUNTER — Other Ambulatory Visit: Payer: Self-pay

## 2013-04-16 ENCOUNTER — Encounter: Payer: Self-pay | Admitting: Internal Medicine

## 2013-05-13 ENCOUNTER — Ambulatory Visit (INDEPENDENT_AMBULATORY_CARE_PROVIDER_SITE_OTHER): Payer: Medicare Other | Admitting: Internal Medicine

## 2013-05-13 ENCOUNTER — Encounter: Payer: Self-pay | Admitting: Internal Medicine

## 2013-05-13 VITALS — BP 138/83 | HR 88 | Temp 97.2°F | Ht 66.0 in | Wt 187.9 lb

## 2013-05-13 DIAGNOSIS — Z6825 Body mass index (BMI) 25.0-25.9, adult: Secondary | ICD-10-CM

## 2013-05-13 DIAGNOSIS — I2510 Atherosclerotic heart disease of native coronary artery without angina pectoris: Secondary | ICD-10-CM

## 2013-05-13 DIAGNOSIS — E785 Hyperlipidemia, unspecified: Secondary | ICD-10-CM

## 2013-05-13 DIAGNOSIS — E663 Overweight: Secondary | ICD-10-CM

## 2013-05-13 DIAGNOSIS — I10 Essential (primary) hypertension: Secondary | ICD-10-CM

## 2013-05-13 DIAGNOSIS — R269 Unspecified abnormalities of gait and mobility: Secondary | ICD-10-CM

## 2013-05-13 DIAGNOSIS — G894 Chronic pain syndrome: Secondary | ICD-10-CM

## 2013-05-13 DIAGNOSIS — E119 Type 2 diabetes mellitus without complications: Secondary | ICD-10-CM

## 2013-05-13 DIAGNOSIS — I739 Peripheral vascular disease, unspecified: Secondary | ICD-10-CM

## 2013-05-13 DIAGNOSIS — Z0000 Encounter for general adult medical examination without abnormal findings: Secondary | ICD-10-CM

## 2013-05-13 DIAGNOSIS — I6350 Cerebral infarction due to unspecified occlusion or stenosis of unspecified cerebral artery: Secondary | ICD-10-CM

## 2013-05-13 DIAGNOSIS — R32 Unspecified urinary incontinence: Secondary | ICD-10-CM

## 2013-05-13 LAB — LIPID PANEL
Cholesterol: 164 mg/dL (ref 0–200)
HDL: 42 mg/dL (ref >39)
LDL Cholesterol: 96 mg/dL (ref 0–99)
Triglycerides: 129 mg/dL (ref <150)

## 2013-05-13 LAB — POCT GLYCOSYLATED HEMOGLOBIN (HGB A1C): Hemoglobin A1C: 7

## 2013-05-13 MED ORDER — DICLOFENAC SODIUM 1 % TD GEL: 2.0000 g | Freq: Four times a day (QID) | TRANSDERMAL | Status: DC

## 2013-05-13 NOTE — Assessment & Plan Note (Signed)
BP is great today! Cont her lisinopril 10 and toprol XL 25.

## 2013-05-13 NOTE — Assessment & Plan Note (Signed)
Doing better with the Detrol. 4 episodes of nocturia but no incontinence.

## 2013-05-13 NOTE — Progress Notes (Signed)
  Subjective:    Patient ID: Vanessa Calderon, female    DOB: 1946/07/09, 67 y.o.   MRN: [id number]  HPI  Please see the A&P for the status of the pt's chronic medical problems.   Review of Systems  Genitourinary: Negative for difficulty urinating.       + nocturia  Musculoskeletal: Positive for arthralgias and gait problem.  Skin: Positive for rash.  Neurological: Positive for weakness.  Psychiatric/Behavioral: Positive for sleep disturbance.       Objective:   Physical Exam  Constitutional: She is oriented to person, place, and time. She appears well-developed and well-nourished. No distress.  HENT:  Head: Normocephalic and atraumatic.  Right Ear: External ear normal.  Left Ear: External ear normal.  Nose: Nose normal.  Mouth/Throat: Oropharynx is clear and moist.  Eyes: Conjunctivae and EOM are normal. Right eye exhibits no discharge. Left eye exhibits no discharge. No scleral icterus.  Neck: Normal range of motion. Neck supple. No thyromegaly present.  Cardiovascular: Normal rate, regular rhythm and normal heart sounds.   She had some irr at first but didn't recur.   Pulmonary/Chest: Effort normal.  Musculoskeletal: Normal range of motion. She exhibits no edema and no tenderness.  See A&P for more detail  Neurological: She is alert and oriented to person, place, and time.  Skin: Skin is warm and dry. She is not diaphoretic.  Psychiatric: She has a normal mood and affect. Her behavior is normal. Judgment and thought content normal.          Assessment & Plan:

## 2013-05-13 NOTE — Assessment & Plan Note (Signed)
She needs to get her ASA and take

## 2013-05-13 NOTE — Assessment & Plan Note (Signed)
She cont to have falls. Will need neurorehab at some point.

## 2013-05-13 NOTE — Assessment & Plan Note (Signed)
Weight up two lbs but overall down from 220 - 185 - 187 today.

## 2013-05-13 NOTE — Assessment & Plan Note (Addendum)
Pt states she is taking the Crestor 10mg . Needs FLP today to see if Crestor has brought the LDL to goal.

## 2013-05-13 NOTE — Assessment & Plan Note (Addendum)
Refuses all vaccinations. Will get MMG today.

## 2013-05-13 NOTE — Assessment & Plan Note (Signed)
She is not taking an ASA. I instructed her to get and take QD.

## 2013-05-13 NOTE — Assessment & Plan Note (Signed)
Again, I encouraged her to get her ASA and take.

## 2013-05-13 NOTE — Patient Instructions (Signed)
You are doing great with your diabets and weight. Keep up the good work.  For your knee 1. Take Ibuprofen 400 mg 4 times a day with food 2. Take tylenol 500 mg 4 times a day  3. Use your knee brace as often as possible 4. Use the new gel on your Right knee four times a day 5. If no better after one week, please call  Please see me in 3-5 months

## 2013-05-13 NOTE — Assessment & Plan Note (Signed)
She has had yrs of R knee pain, unknown etiology. She was seen 11/2012 in ED for increased pain after fall. Plain and CT 2014 confirmed severe OA. She had another fall one month ago in her bathroom and injured the R knee again and the pain has increased. It hurts all the time and increased in severity with standing and walking. She was given a knee brace (elastic type) in the ED and using that helps the pain a bit along with IBU. Her knee has never given out. The pain is medial superior. She has never seen ortho for this. There is tenderness to palp of the medial superior knee but no effusion and no crepitus. Likely dx is OA. Plan is  1. IBU 400 PO QID with food.  2. Tylenol 500 QID  3. Voltaren gell QID 4. Cont knee brace  If no better steroid injection with [facility] resident when [place], or [place] attending or sports med. And PT. If she fails that then ortho but high surgical risk.

## 2013-05-13 NOTE — Assessment & Plan Note (Signed)
A1C is now 7.0! She states she is eating less and less sweets also. On metformin only. Eye exam today. Refuses all vaccines.

## 2013-05-23 ENCOUNTER — Ambulatory Visit (HOSPITAL_COMMUNITY)
Admission: RE | Admit: 2013-05-23 | Discharge: 2013-05-23 | Disposition: A | Discharge status: Home / Self Care | Payer: Medicare Other | Source: Ambulatory Visit | Attending: Internal Medicine | Admitting: Internal Medicine

## 2013-05-23 DIAGNOSIS — Z0000 Encounter for general adult medical examination without abnormal findings: Secondary | ICD-10-CM

## 2013-05-23 DIAGNOSIS — Z1231 Encounter for screening mammogram for malignant neoplasm of breast: Secondary | ICD-10-CM | POA: Insufficient documentation

## 2013-06-12 ENCOUNTER — Other Ambulatory Visit: Payer: Self-pay | Admitting: Internal Medicine

## 2013-06-12 ENCOUNTER — Encounter: Payer: Self-pay | Admitting: Internal Medicine

## 2013-06-12 NOTE — Progress Notes (Signed)
Patient ID: Vanessa Calderon, female   DOB: [birth year], 67 y.o.   MRN: [id number] This patient has been identified as a high fall risk and is a female that is 65+.  Please consider ordering a DEXA scan to screen for osteoporosis.

## 2013-06-12 NOTE — Addendum Note (Signed)
Addended by: Bufford Spikes on: 06/12/2013 02:05 PM   Modules accepted: Orders

## 2013-06-17 ENCOUNTER — Encounter: Payer: Self-pay | Admitting: Internal Medicine

## 2013-10-21 ENCOUNTER — Encounter: Payer: Self-pay | Admitting: Internal Medicine

## 2013-10-21 ENCOUNTER — Ambulatory Visit (INDEPENDENT_AMBULATORY_CARE_PROVIDER_SITE_OTHER): Payer: BC Managed Care – PPO | Admitting: Internal Medicine

## 2013-10-21 VITALS — BP 179/93 | HR 91 | Temp 97.9°F | Ht 66.0 in | Wt 186.6 lb

## 2013-10-21 DIAGNOSIS — Z0000 Encounter for general adult medical examination without abnormal findings: Secondary | ICD-10-CM

## 2013-10-21 DIAGNOSIS — E785 Hyperlipidemia, unspecified: Secondary | ICD-10-CM

## 2013-10-21 DIAGNOSIS — R32 Unspecified urinary incontinence: Secondary | ICD-10-CM

## 2013-10-21 DIAGNOSIS — I2510 Atherosclerotic heart disease of native coronary artery without angina pectoris: Secondary | ICD-10-CM

## 2013-10-21 DIAGNOSIS — R269 Unspecified abnormalities of gait and mobility: Secondary | ICD-10-CM

## 2013-10-21 DIAGNOSIS — Z8673 Personal history of transient ischemic attack (TIA), and cerebral infarction without residual deficits: Secondary | ICD-10-CM

## 2013-10-21 DIAGNOSIS — I10 Essential (primary) hypertension: Secondary | ICD-10-CM

## 2013-10-21 DIAGNOSIS — E119 Type 2 diabetes mellitus without complications: Secondary | ICD-10-CM

## 2013-10-21 DIAGNOSIS — G894 Chronic pain syndrome: Secondary | ICD-10-CM

## 2013-10-21 DIAGNOSIS — I739 Peripheral vascular disease, unspecified: Secondary | ICD-10-CM

## 2013-10-21 DIAGNOSIS — Z742 Need for assistance at home and no other household member able to render care: Secondary | ICD-10-CM

## 2013-10-21 DIAGNOSIS — I6350 Cerebral infarction due to unspecified occlusion or stenosis of unspecified cerebral artery: Secondary | ICD-10-CM

## 2013-10-21 DIAGNOSIS — E663 Overweight: Secondary | ICD-10-CM

## 2013-10-21 LAB — BASIC METABOLIC PANEL WITH GFR
BUN: 11 mg/dL (ref 6–23)
CALCIUM: 9.4 mg/dL (ref 8.4–10.5)
CO2: 27 mEq/L (ref 19–32)
CREATININE: 0.74 mg/dL (ref 0.50–1.10)
Chloride: 105 mEq/L (ref 96–112)
GFR, EST NON AFRICAN AMERICAN: 85 mL/min
GFR, Est African American: >89 mL/min
Glucose, Bld: 112 mg/dL — ABNORMAL HIGH (ref 70–99)
Potassium: 3.6 mEq/L (ref 3.5–5.3)
Sodium: 145 mEq/L (ref 135–145)

## 2013-10-21 LAB — POCT GLYCOSYLATED HEMOGLOBIN (HGB A1C): Hemoglobin A1C: 6.4

## 2013-10-21 LAB — GLUCOSE, CAPILLARY: Glucose-Capillary: 124 mg/dL — ABNORMAL HIGH (ref 70–99)

## 2013-10-21 MED ORDER — METFORMIN HCL 500 MG PO TABS: 500.0000 mg | ORAL_TABLET | Freq: Every day | ORAL | Status: DC

## 2013-10-21 MED ORDER — DICLOFENAC SODIUM 1 % TD GEL: 2.0000 g | Freq: Four times a day (QID) | TRANSDERMAL | Status: DC

## 2013-10-21 NOTE — Assessment & Plan Note (Signed)
Lives alone. Drives. Has friend and they go shopping. Doesn't cook.

## 2013-10-21 NOTE — Assessment & Plan Note (Signed)
Refer to overview for PMHx. She describes claudication when walks 2 blocks in R calf. No rest pain. RF mgmt.

## 2013-10-21 NOTE — Progress Notes (Signed)
   Subjective:    Patient ID: Vanessa Calderon, female    DOB: Aug 02, 1946, 68 y.o.   MRN: [id number]  Shoulder Injury  Pertinent negatives include no chest pain.   Please see the A&P for the status of the pt's chronic medical problems.    Review of Systems  Constitutional: Negative for activity change and unexpected weight change.  HENT: Negative for rhinorrhea and trouble swallowing.   Eyes: Positive for visual disturbance.  Respiratory: Positive for cough and shortness of breath.   Cardiovascular: Negative for chest pain.  Gastrointestinal: Negative for abdominal pain and diarrhea.  Genitourinary: Positive for urgency and difficulty urinating.  Musculoskeletal: Positive for arthralgias, gait problem and myalgias.  Neurological: Negative for dizziness, light-headedness and headaches.  Psychiatric/Behavioral: Positive for sleep disturbance.       Objective:   Physical Exam  Constitutional: She is oriented to person, place, and time. She appears well-developed and well-nourished. No distress.  HENT:  Head: Normocephalic and atraumatic.  Right Ear: External ear normal.  Left Ear: External ear normal.  Nose: Nose normal.  Eyes: Conjunctivae and EOM are normal.  Neck: Neck supple.  Cardiovascular: Normal rate, regular rhythm and normal heart sounds.  Exam reveals no friction rub.   No murmur heard. Pulmonary/Chest: Effort normal and breath sounds normal. No stridor. No respiratory distress.  Musculoskeletal: She exhibits no edema.  Decreased ROM all movements. Diffusely tender but worst anterior superior.   Lymphadenopathy:    She has no cervical adenopathy.  Neurological: She is alert and oriented to person, place, and time.  Skin: Skin is warm and dry. She is not diaphoretic.  Psychiatric: She has a normal mood and affect. Her behavior is normal. Judgment and thought content normal.          Assessment & Plan:

## 2013-10-21 NOTE — Assessment & Plan Note (Signed)
Takes her statin and LDL is at goal

## 2013-10-21 NOTE — Assessment & Plan Note (Addendum)
Refuses flu and PNA. Discuss colon at next appt  C/o trouble sleeping. Goes to bed at 78 or 8 PM and has trouble sleeping. Gets up about 3 times to go to bathroom. Trouble falling asleep when back to bed. Wakes up 5 or 6 AM. No naps. In bed 10 hours. Likely getting enough sleep.

## 2013-10-21 NOTE — Assessment & Plan Note (Signed)
She is now on ASA 325. Asks if stroke will get better and explained that since 5 yrs ago, she likely ahs made most pf progress that she will make.

## 2013-10-21 NOTE — Patient Instructions (Signed)
1. Please make an appt in 2 weeks to chck your blood pressure. Please take all meds prior to the appt 2. I filled out handicap parking 3. For your shoulder   See sports medicine  Take tylenol 500 mg and ibuprofen 200 mg with food four times a day   Use the Voltarin gel on your Left shoulder and Right knee 4. I am referring you to neuro rehab since you are falling freq 5. Once you see all doctors for your shoulder, I will send you to an eye doctor for your left eye. Please call me after you see the doctors for your shoulder 6. See me in 3 months

## 2013-10-21 NOTE — Assessment & Plan Note (Signed)
She is only taking Toviaz. Gets up 3 times night;y. No daytime incontinence and doesn't have to wear pad. Overall better.

## 2013-10-21 NOTE — Assessment & Plan Note (Addendum)
Lab Results  Component Value Date   HGBA1C 6.4 10/21/2013   She stopped her metformin 500 BID bc she thought it was making her stressed and depressed. Stated that stress and depression did not get better after stopping it. A1C well controlled. Checks CBG QD fasting. No log or meter today. States 180 - 190. Therefore, asked that she try metformin 500 QD and she is wiling.   Refused PNA and flu vaccine.

## 2013-10-21 NOTE — Assessment & Plan Note (Signed)
Poor control bc only taking lisinopril (and didn't take this AM) and stopped her BB and norvasc. Asked her to take her lisinopril QD and to take on AM of next appt in 2 weeks. Will need to add back Amlodipine 10 and Toprol 25 (toprol first since CAD).

## 2013-10-21 NOTE — Assessment & Plan Note (Signed)
2 falls in past 2 months. States either trips or loses balance. Also states cannot see anything out of L eye but that is OLD. Referral to neurorehab. Uses cane. Filled out handicap placard.

## 2013-10-21 NOTE — Assessment & Plan Note (Signed)
She cont to have R knee pain. She now has new L shoulder pain that started without preceding trauma. It hurts all the time and movement doesn't make it worse. She has not noticed weakness. She is taking OTC meds like tylenol and Advil without relief. She asks for OxyContin (although might have meant oxycodone) bc she took a friends and it helped.   Her R knee is 2/2 OA as shown by plain films and CT. She is still walking although with freq falls and with cane. I feel that she is a high risk surgical candidate and is not an appropriate opioid candidate. Therefore, cont to try conservative measures. Since she is not c/o her R knee knee, cont IBU and tylenol.   Her L shoulder is likely rotater cuff. My plan is to refer to sports med. I refused opioids and will cont with IBU, tylenol and Voltaren gel.

## 2013-10-21 NOTE — Assessment & Plan Note (Signed)
She is taking ASA 325. But she stopped her BB although cont to take her statin. She needs BB added back but she has poor med Financial controller. She had been involved with [facility] but was dismissed after 2 months 2/2 nonadherance. Her med adherence is a big issue and her med issues will only progress if she cannot take her meds as directed. Will see if [facility] is willing to make another go. Will not Rx BB at this time but pt will return one month for BP check and BB may be added then.

## 2013-10-28 ENCOUNTER — Encounter: Payer: Self-pay | Admitting: Internal Medicine

## 2013-10-28 ENCOUNTER — Ambulatory Visit: Payer: Medicare Other | Admitting: Internal Medicine

## 2013-11-03 ENCOUNTER — Ambulatory Visit: Payer: Medicare Other | Admitting: Family Medicine

## 2013-11-05 ENCOUNTER — Ambulatory Visit: Payer: Medicare Other | Admitting: Family Medicine

## 2013-11-05 NOTE — Addendum Note (Signed)
Addended by: Hulan Fray on: 11/05/2013 05:17 PM   Modules accepted: Orders

## 2013-11-19 NOTE — Addendum Note (Signed)
Addended by: Hulan Fray on: 11/19/2013 07:24 AM   Modules accepted: Orders

## 2013-12-08 ENCOUNTER — Encounter: Payer: Self-pay | Admitting: Internal Medicine

## 2013-12-08 ENCOUNTER — Encounter: Payer: Self-pay | Admitting: Licensed Clinical Social Worker

## 2013-12-08 ENCOUNTER — Ambulatory Visit (INDEPENDENT_AMBULATORY_CARE_PROVIDER_SITE_OTHER): Payer: BC Managed Care – PPO | Admitting: Internal Medicine

## 2013-12-08 VITALS — BP 179/91 | HR 90 | Temp 97.4°F | Wt 186.2 lb

## 2013-12-08 DIAGNOSIS — E119 Type 2 diabetes mellitus without complications: Secondary | ICD-10-CM

## 2013-12-08 DIAGNOSIS — Z742 Need for assistance at home and no other household member able to render care: Secondary | ICD-10-CM

## 2013-12-08 DIAGNOSIS — R32 Unspecified urinary incontinence: Secondary | ICD-10-CM

## 2013-12-08 DIAGNOSIS — R61 Generalized hyperhidrosis: Secondary | ICD-10-CM | POA: Insufficient documentation

## 2013-12-08 DIAGNOSIS — I10 Essential (primary) hypertension: Secondary | ICD-10-CM

## 2013-12-08 LAB — T4, FREE: Free T4: 0.92 ng/dL (ref 0.80–1.80)

## 2013-12-08 LAB — TSH: TSH: 1.333 u[IU]/mL (ref 0.350–4.500)

## 2013-12-08 NOTE — Assessment & Plan Note (Signed)
Will have our nurse [facility] call Dr. Mikle Bosworth office to request refill as he may want to see patient again.

## 2013-12-08 NOTE — Progress Notes (Signed)
CSW met with Vanessa Calderon during her scheduled [facility] appt this morning.  Vanessa Calderon, AKA Vanessa Calderon (married name now divorced), prefers to be addressed by her maiden name as she is no longer married.  Pt confirmed she now has [facility] and denies having issues obtaining her medications.  Vanessa Calderon lives alone with no services.  During this visit, Vanessa Calderon is well dressed, groomed and using cane, improvement from [place] previous visit.  It was unclear during this visit what medications Vanessa Calderon truly had missed.  Vanessa Calderon informed CSW she was in need of Toviaz prescription, but pronounced incorrectly.  Pt states she needed a hard copy of the prescription because she could not wait for [employer].  CSW discussed [facility] referral vs Home health RN referral.  Pt states she still drives and runs errands during the day.  Pt in agreement with referral to [facility].  Referral completed.  Pt denies add'l needs at this time and is aware CSW is available to assist as needed.

## 2013-12-08 NOTE — Progress Notes (Signed)
Case discussed with Dr. Heber Etna at the time of the visit.  We reviewed the resident's history and exam and pertinent patient test results.  I agree with the assessment, diagnosis and plan of care documented in the resident's note.

## 2013-12-08 NOTE — Progress Notes (Signed)
[facility] Subjective:   Patient ID: Vanessa Calderon female   DOB: 1945-10-24 68 y.o.   MRN: [id number]  HPI: [name] is a 68 y.o. female with a PMH significant for T2DM, HTN, CAD, HLD, CVA.  She presents today for an acute visit for excessive sweating during the daytime for the past ~3 weeks.  She does report associated symptoms of fatigue, and mild nonproductive cough.  She denies fever, dysuria, weight loss, night sweats, pruritis, diarrhea, pain other than chronic r knee pain.  She has never traveled outside the country, no contact with livestock, no new medications, she occasionally drinks ETOH and smokes tobacco but no sudden abstinence.  She was last sexually active 4 years ago.  She reports she underwent menopause 6 years ago after hysterectomy.  She has been taking Metformin 519m BID and reports checking her BS once a day in afternoon with sugars ~140s.  Last A1c 6.6.  Her last TSh was in 2011 and was 0.539.  Last HIV in 2011: non reactive.  She does report increased urinary frequency associated with her history of incontinence, she is asking for a refill of Toviaz that was prescribed by Dr. [facility].  She reports she did not take her lisinopril this AM as she does not have any.  It appears it was last prescribed on 03/04/13 with a years worth or refills.  She seems to be a bit confused about her medications and changes her answer especially about lisinopril.  She does not have anyone at home helping her with her medications.  Past Medical History  Diagnosis Date  . CORONARY ARTERY DISEASE 05/15/2008    CABG 2006, DES to the saphenous vein graft to the ramus in December of 2006.  ECHO 2010 : basal inferior wall akinesis. Post-op septal wall dyssynchrony. Grade 1diastolic dysfunction.       .Marland KitchenDIABETES MELLITUS, TYPE II 05/15/2008    Well controlled on oral meds.    .Marland KitchenHYPERLIPIDEMIA 05/11/2008    Mgmt with statin    . HYPERTENSION 05/15/2008     Needs BB 2/2 CAD and ACEI 2/2 DM.     .Marland KitchenOverweight (BMI 25.0-29.9) 03/27/2012  . Tobacco use 03/27/2012    She has tried Chantix in past with intolerable side effects.   .Marland KitchenCVA 09/23/2009    Left CEA May 2006 at same time as CABG 2010 : Acute left hemisphere infarct of posterior limb internal capsule and lateral thalamus.  Prior CVA's but dates unknown, imaging shows widespread multifocal remote lacunar infarcts throughout the brain and brainstem.  MRA 2010 : no severe proximal flow limiting stenosis.    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 aspirin 325 MG tablet Take 1 tablet (325 mg total) by mouth daily.  30 tablet  11  . Blood Glucose Monitoring Suppl (CONTOUR NEXT EZ MONITOR) W/DEVICE KIT 1 each by Does not apply route 2 (two) times daily.  1 kit  0  . diclofenac sodium (VOLTAREN) 1 % GEL Apply 2 g topically 4 (four) times daily.  100 g  2  . fesoterodine (TOVIAZ) 8 MG TB24 tablet Take 1 tablet (8 mg total) by mouth daily.  30 tablet    . glucose blood (BAYER CONTOUR NEXT TEST) test strip TWICE A DAY TESTING, 250.02  100 each  11  . ibuprofen (ADVIL,MOTRIN) 100 MG tablet Take 100 mg by mouth 2 (two)  times daily.      . Lancets 30G MISC 1 each by Does not apply route 2 (two) times daily. DX CODE 250.02,  100 each  5  . lisinopril (PRINIVIL,ZESTRIL) 10 MG tablet take 1 tablet by mouth once daily  90 tablet  3  . metFORMIN (GLUCOPHAGE) 500 MG tablet Take 1 tablet (500 mg total) by mouth daily with breakfast.  90 tablet  3  . OVER THE COUNTER MEDICATION Take 1 capsule by mouth 3 (three) times daily. Omega XL 300 mg capsule      . rosuvastatin (CRESTOR) 10 MG tablet Take 1 tablet (10 mg total) by mouth at bedtime.  90 tablet  3  . [DISCONTINUED] tolterodine (DETROL LA) 4 MG 24 hr capsule Take 1 capsule (4 mg total) by mouth daily.  30 capsule  0   No current facility-administered medications for this visit.   No  family history on file. History   Social History  . Marital Status: Single    Spouse Name: N/A    Number of Children: N/A  . Years of Education: 34   Social History Main Topics  . Smoking status: Current Every Day Smoker -- 0.50 packs/day    Types: Cigarettes  . Smokeless tobacco: Current User     Comment: trying to cut back.  . Alcohol Use: No  . Drug Use: No  . Sexual Activity: None   Other Topics Concern  . None   Social History Narrative   Pt grew up in [place]. Has daughter in [place]. Divorced. Lived in [place] about 2010 but then returned to [place] for a couple of yrs and returned to [place] in 2013. No family here but has friends. Some gait instability since CVA but no freq falls. Decreased vision in L eye. Has license but no car. Lives in apartment independently.    Review of Systems: Review of Systems  Constitutional: Positive for malaise/fatigue. Negative for fever, chills and weight loss.  Respiratory: Negative for cough and shortness of breath.   Cardiovascular: Negative for chest pain and leg swelling.  Gastrointestinal: Negative for vomiting and abdominal pain.  Genitourinary: Positive for frequency. Negative for dysuria.  Musculoskeletal: Positive for falls (1 or 2) and joint pain (right knee).  Neurological: Negative for weakness.  Psychiatric/Behavioral: Positive for depression. Negative for suicidal ideas and substance abuse. The patient is not nervous/anxious.      Objective:  Physical Exam: Filed Vitals:   12/08/13 1032  BP: 179/91  Pulse: 90  Temp: 97.4 F (36.3 C)  TempSrc: Oral  Weight: 186 lb 3.2 oz (84.46 kg)  SpO2: 96%  Physical Exam  Nursing note and vitals reviewed. Constitutional: She is well-developed, well-nourished, and in no distress.  Appears older than stated age  HENT:  Head: Normocephalic and atraumatic.  No cervical LAD appreciated   Neck: Normal range of motion. Neck supple. No thyromegaly present.  Cardiovascular:  Normal rate, regular rhythm and normal heart sounds.   No murmur heard. Pulmonary/Chest: Effort normal and breath sounds normal. No respiratory distress. She has no wheezes.  Upper airway congestion clears with cough  Abdominal: Soft. Bowel sounds are normal. There is no tenderness.  Neurological:  2+ BR, and achillies reflexes, unable to transfer to exam bed for pattellar.    Assessment & Plan:  Case discussed with Dr. Eppie Gibson See Problem Based Assessment and Plan Medications Ordered No orders of the defined types were placed in this encounter.   Other Orders Orders Placed This Encounter  Procedures  . TSH  . T4, Free  . HIV 1, 2 Antibody, with Reflex  . Urinalysis, Reflex Microscopic  . Consult to [facility]    Confusion about medication.  Help with organizing medication.    Order Specific Question:  Reason for consult    Answer:  Non-compliance with lisinopril.    Order Specific Question:  Diagnoses of    Answer:  Diabetes    Order Specific Question:  Expected date of contact    Answer:  within 10 days

## 2013-12-08 NOTE — Assessment & Plan Note (Signed)
Hypertensive again today at 172/91.  I do not think she is compliant with lisinopril.  Urged compliance with lisinopril and will try to get [facility] again involved in her care to help with medication compliance.  Will have her return in 2 weeks for BP recheck.

## 2013-12-08 NOTE — Patient Instructions (Signed)
Please return in 2 weeks and bring all of your medications. Please bring your Glucometer and blood sugar readings, remember to take your blood sugar when you feel sweaty.  Please bring your medicines with you each time you come.   Medicines may be  Eye drops  Herbal   Vitamins  Pills  Seeing these help [place] take care of you.

## 2013-12-08 NOTE — Assessment & Plan Note (Signed)
Currently well controlled. Taking Metformin twice a day, instructed she can cut down to once a day.  Stressed importance of checking Blood sugars during sweating episodes.  Patient will follow up in 2 weeks.

## 2013-12-08 NOTE — Assessment & Plan Note (Addendum)
Broad differential, no concerning symptoms of weight loss, pruritis, diarrhea, hemoptysis.  Low likelihood of TB or other infectious process, no new medications as causes.  Possibly perimenopausal/post menopause symptoms however unlikely to present suddenly.    -  May be secondary to hypoglycemia and will have patient try to check CBG during episodes. - Given patient's borderline low TSH in past will recheck TSH and free T4 today - Given patient's increased urinary frequency will check U/A (although unlikely UTI, no dysuria and frequency likely 2/2 incontinence and running out of medication) - Check HIV (none in last 4 years)  Update: Patient reports she is unable to provide urine sample at this time. ADDENDUM: TSH, T4 wnl.  HIV negative.

## 2013-12-08 NOTE — Assessment & Plan Note (Signed)
-   Referred to [facility], - Handicap decal paperwork completed.

## 2013-12-09 LAB — GLUCOSE, CAPILLARY: Glucose-Capillary: 123 mg/dL — ABNORMAL HIGH (ref 70–99)

## 2013-12-09 LAB — HIV ANTIBODY (ROUTINE TESTING W REFLEX): HIV: NONREACTIVE

## 2013-12-09 NOTE — Addendum Note (Signed)
Addended by: Marcelino Duster on: 12/09/2013 08:49 AM   Modules accepted: Orders

## 2013-12-22 ENCOUNTER — Ambulatory Visit: Payer: BC Managed Care – PPO | Admitting: Internal Medicine

## 2014-01-05 ENCOUNTER — Ambulatory Visit: Payer: BC Managed Care – PPO | Admitting: Internal Medicine

## 2014-01-09 ENCOUNTER — Ambulatory Visit (INDEPENDENT_AMBULATORY_CARE_PROVIDER_SITE_OTHER): Payer: BC Managed Care – PPO | Admitting: Internal Medicine

## 2014-01-09 VITALS — BP 135/72 | HR 84 | Temp 98.0°F | Wt 186.2 lb

## 2014-01-09 DIAGNOSIS — R61 Generalized hyperhidrosis: Secondary | ICD-10-CM

## 2014-01-09 DIAGNOSIS — Z742 Need for assistance at home and no other household member able to render care: Secondary | ICD-10-CM

## 2014-01-09 DIAGNOSIS — G894 Chronic pain syndrome: Secondary | ICD-10-CM

## 2014-01-09 DIAGNOSIS — E119 Type 2 diabetes mellitus without complications: Secondary | ICD-10-CM

## 2014-01-09 DIAGNOSIS — I10 Essential (primary) hypertension: Secondary | ICD-10-CM

## 2014-01-09 LAB — GLUCOSE, CAPILLARY: GLUCOSE-CAPILLARY: 176 mg/dL — AB (ref 70–99)

## 2014-01-09 MED ORDER — IBUPROFEN 800 MG PO TABS: 800.0000 mg | ORAL_TABLET | Freq: Two times a day (BID) | ORAL | Status: DC | PRN

## 2014-01-09 NOTE — Patient Instructions (Signed)
-  Continue taking your medications as prescribed.  -Call us if [facility] does not contact you or if your are unable to reach them.  -Follow up next month for diabetes care.   Please bring your medicines with you each time you come.   Medicines may be  Eye drops  Herbal   Vitamins  Pills  Seeing these help [place] take care of you.

## 2014-01-09 NOTE — Assessment & Plan Note (Addendum)
BP Readings from Last 3 Encounters:  01/09/14 135/72  12/08/13 179/91  10/21/13 179/93    Lab Results  Component Value Date   NA 145 10/21/2013   K 3.6 10/21/2013   CREATININE 0.74 10/21/2013    Assessment: Blood pressure control:   Progress toward BP goal:    Comments: on lisinopril 10mg  daily  Plan: Medications:  continue current medications Educational resources provided:   Self management tools provided:   Other plans: Follow up in 1 month

## 2014-01-11 ENCOUNTER — Encounter: Payer: Self-pay | Admitting: Internal Medicine

## 2014-01-11 NOTE — Assessment & Plan Note (Signed)
Was referred back to [facility] during her last visit--[facility] to follow up with pt.  Pt now has CNA for 2 hours per day during the week but per CNA, pt's daughter may increase the hours of care.  -The pt and the CNA would like [facility] to make recommendations in regards to DME needs at home such as a shower bench, etc.

## 2014-01-11 NOTE — Assessment & Plan Note (Signed)
She has been taking Ibuprofen 800mg  twice per day for years with good relief of her pain. Her BP is well controlled during this visit despite chronic use of his NSAIDs. Discussed risk on increase in BP with this medication but pt states that Tylenol does not work as well for her and she wants to continuing taking Ibuprofen 800mg  BID for her pain.  Rx Ibuprofen 800mg  BID PRN for pain

## 2014-01-11 NOTE — Assessment & Plan Note (Addendum)
No meter during this visit. Per her recent visit, metformin to be decreased to 500mg  daily ac due to concerns of episodes of excessive sweating that could be due to hypoglycemia and nl/low Hg A1c of 6.4%.  Pt's CNA notified during visit. Pt continues to check CBG twice daily before breakfast and before dinner time.  Pt's CNA to note if pt's CBG is 80< and >200 and to call the [facility].

## 2014-01-11 NOTE — Progress Notes (Signed)
   Subjective:    Patient ID: Vanessa Calderon, female    DOB: [birth year], 68 y.o.   MRN: [id number]  Hypertension Pertinent negatives include no chest pain, headaches or shortness of breath.   Vanessa Calderon is a 68 yr old woman with PMH of chronic pain syndrome, DM2, HTN, CVA with right hemiplegia, who presents for routine follow up visit for her chronic medical problems. She is accompanied by her CNA who has been working with her for almost 2 weeks now. Since her last visit, Vanessa Calderon has been taking her medications every day and is using a pill box. She has not reduced her metformin dose to 500mg  daily. Since [facility] at [facility], she has contacted Dr. Mikle Bosworth office has had [place] did not have a follow up visit at that office.   She lost her previous handicap application form and requests for a handicap application to be filled out again during this visit.  [facility] has not contacted her at home. Her CNA points out that the patient needs certain DME equipment (shower bench, etc), but they would like a formal evaluation from [facility] for equipment recommendations.  She has been taking Ibuprofen 800mg  twice per day for her hip pain for a few years now and requests a prescription for this medication.    Review of Systems  Constitutional: Positive for diaphoresis. Negative for fever, chills, appetite change, fatigue and unexpected weight change.  HENT:       Chronic dysarthria   Respiratory: Negative for cough and shortness of breath.   Cardiovascular: Negative for chest pain and leg swelling.  Gastrointestinal: Negative for abdominal pain.  Genitourinary: Negative for dysuria.  Neurological: Negative for light-headedness and headaches.       Chronic right hemiparesis   Psychiatric/Behavioral: Negative for agitation.       Objective:   Physical Exam  Nursing note and vitals reviewed. Constitutional: She is oriented to person, place, and time. She appears well-developed  and well-nourished. No distress.  Mild dysarthria   Cardiovascular: Normal rate and regular rhythm.   Pulmonary/Chest: Effort normal. No respiratory distress. She has no wheezes. She has no rales.  Abdominal: Soft.  Musculoskeletal: She exhibits no edema and no tenderness.  Neurological: She is alert and oriented to person, place, and time. Coordination abnormal.  Using a cane to stabilize her gait   Skin: Skin is warm and dry. She is not diaphoretic.  Psychiatric: She has a normal mood and affect.          Assessment & Plan:

## 2014-01-11 NOTE — Assessment & Plan Note (Signed)
She still has periods of excessive sweating but has not checked her CBG during these times. She will decrease metformin to 500mg  daily.

## 2014-01-12 NOTE — Progress Notes (Signed)
INTERNAL MEDICINE TEACHING ATTENDING ADDENDUM - Sylis Ketchum, MD: I reviewed and discussed at the time of visit with the resident Dr. Kennerly, the patient's medical history, physical examination, diagnosis and results of tests and treatment and I agree with the patient's care as documented. 

## 2014-02-06 ENCOUNTER — Telehealth: Payer: Self-pay | Admitting: *Deleted

## 2014-02-06 NOTE — Telephone Encounter (Signed)
I agree with appt, meter, and podiatry at appt. Thanks

## 2014-02-06 NOTE — Telephone Encounter (Signed)
Call from friend of Vanessa Calderon who wants to report an elevated CGB today. This morning pt took meds and ate breakfast, friend arrived after that and checked CGB - reading was 306 after eating. Checking past readings - In AM before breakfast CBG usually below 200 but on 11th - cbg was 210, on 18th - cbg was 170 and on 20th - cbg was 232 Pt takes Metformin 500 mg daily  Pt has scheduled appointment of 5/27. I will have her bring her meter She also reports an ingrown toenail and wants referral to Podiatry.  We can do that at next visit.

## 2014-02-11 ENCOUNTER — Encounter: Payer: Self-pay | Admitting: Internal Medicine

## 2014-02-11 ENCOUNTER — Ambulatory Visit (INDEPENDENT_AMBULATORY_CARE_PROVIDER_SITE_OTHER): Payer: BC Managed Care – PPO | Admitting: Internal Medicine

## 2014-02-11 VITALS — BP 135/78 | HR 98 | Temp 98.4°F | Wt 187.8 lb

## 2014-02-11 DIAGNOSIS — E663 Overweight: Secondary | ICD-10-CM

## 2014-02-11 DIAGNOSIS — G894 Chronic pain syndrome: Secondary | ICD-10-CM

## 2014-02-11 DIAGNOSIS — E119 Type 2 diabetes mellitus without complications: Secondary | ICD-10-CM

## 2014-02-11 DIAGNOSIS — I10 Essential (primary) hypertension: Secondary | ICD-10-CM

## 2014-02-11 DIAGNOSIS — IMO0001 Reserved for inherently not codable concepts without codable children: Secondary | ICD-10-CM

## 2014-02-11 DIAGNOSIS — E1165 Type 2 diabetes mellitus with hyperglycemia: Secondary | ICD-10-CM

## 2014-02-11 LAB — GLUCOSE, CAPILLARY: GLUCOSE-CAPILLARY: 307 mg/dL — AB (ref 70–99)

## 2014-02-11 LAB — POCT GLYCOSYLATED HEMOGLOBIN (HGB A1C): HEMOGLOBIN A1C: 7.6

## 2014-02-11 MED ORDER — METFORMIN HCL 500 MG PO TABS: 500.0000 mg | ORAL_TABLET | Freq: Two times a day (BID) | ORAL | Status: DC

## 2014-02-11 MED ORDER — IBUPROFEN 800 MG PO TABS: 800.0000 mg | ORAL_TABLET | Freq: Two times a day (BID) | ORAL | Status: DC | PRN

## 2014-02-11 MED ORDER — DICLOFENAC SODIUM 1 % TD GEL: 2.0000 g | Freq: Four times a day (QID) | TRANSDERMAL | Status: DC

## 2014-02-11 NOTE — Patient Instructions (Signed)
-  Please increase metformin back up to 500mg  twice daily  -I have placed referrals for opthalmology (since you noted high pressures in your eyes in the past) and podiatry (to help cut your nails)  -I refilled your ibuprofen and voltaren gel for your knee pain   Thank you for bringing your medicines today. This helps [place] keep you safe from mistakes.  Please be sure to bring all of your medications with you to every visit.  Should you have any new or worsening symptoms, please be sure to call the clinic at [phone].

## 2014-02-11 NOTE — Assessment & Plan Note (Signed)
Refilled ibuprofen 800mg  BID #60, also refilled voltaren gel

## 2014-02-11 NOTE — Assessment & Plan Note (Addendum)
Lab Results  Component Value Date   HGBA1C 7.6 02/11/2014   HGBA1C 6.4 10/21/2013   HGBA1C 7.0 05/13/2013     Assessment: Diabetes control:  uncontrolled Progress toward A1C goal:   deteriorated Comments: metformin was decreased to 500mg  daily for concerns of hypoglycemia - no hypoglycemic events, in fact has been hyperglycemic  Plan: Medications:  increase metformin back to 500 bid Home glucose monitoring: Frequency:  as needed Instruction/counseling given: reminded to get eye exam, reminded to bring blood glucose meter & log to each visit and reminded to bring medications to each visit Other plans: had referred to ophthalmology but wrong MD on insurance card - concern for h/o glaucoma, needs referral once insurance card fixed.  Also was going to refer to podiatry given thick toenails that need to be cut in setting of DM, but again, insurance card to be fixed

## 2014-02-11 NOTE — Assessment & Plan Note (Signed)
BP Readings from Last 3 Encounters:  02/11/14 135/78  01/09/14 135/72  12/08/13 179/91   Lab Results  Component Value Date   NA 145 10/21/2013   K 3.6 10/21/2013   CREATININE 0.74 10/21/2013    Assessment: Blood pressure control:  controlled Progress toward BP goal:   at goal  Plan: Medications:  continue current medications - amlodipine 10, lisinopril 10 are the medications she has with her - ideally, we would like to titrate lisinopril up and get amlodipine off, but med compliance has been a big issue for patient.  Because of noncompliance, BB (given CAD) had also been d/c

## 2014-02-11 NOTE — Progress Notes (Signed)
Subjective:   Patient ID: Vanessa Calderon female   DOB: [birth year] 68 y.o.   MRN: [id number]  Chief Complaint  Patient presents with  . 56mh recheck   HPI: Vanessa C GBoltzis a 68y.o. woman with h/o DM, HLD, HTN, CAD, h/o CVA, PVD, chronic pain syndrome and tobacco abuse who presents for follow up.    She was seen on 01/09/13 for DM & HTN.    She takes ibuprofen 8037mBID so would like #60 instead of #30.  Also requests voltaren gel refill.    Requests referrals to optho (feels she may need glasses, has been told she has high pressures in her eyes before) and podiatry (cut toe nails)  Review of Systems: Constitutional: Denies fever, chills, diaphoresis, appetite change and fatigue.  HEENT: Denies photophobia, eye pain, redness, hearing loss, ear pain, congestion, sore throat, rhinorrhea, sneezing, mouth sores, trouble swallowing, neck pain, neck stiffness and tinnitus. 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 Respiratory: Denies SOB, chest tightness, and wheezing.+DOE, coughs a lot - white mucus Cardiovascular: Denies chest pain, palpitations and leg swelling.  Gastrointestinal: Denies nausea, vomiting, abdominal pain, diarrhea, constipation,blood in stool and abdominal distention.  Genitourinary: Denies dysuria, frequency, hematuria, flank pain and difficulty urinating. +urge incont Musculoskeletal: chronic right knee pain Skin: Denies pallor, rash and wound.  Neurological: Denies dizziness, seizures, syncope, weakness, lightheadedness, numbness and headaches.  Hematological: no s/s of bleeding    Past Medical History  Diagnosis Date  . CORONARY ARTERY DISEASE 05/15/2008    CABG 2006, DES to the saphenous vein graft to the ramus in December of 2006.  ECHO 2010 : basal inferior wall akinesis. Post-op septal wall dyssynchrony. Grade 1diastolic dysfunction.       . Marland KitchenIABETES MELLITUS, TYPE II 05/15/2008    Well controlled on oral meds.    . Marland KitchenYPERLIPIDEMIA 05/11/2008    Mgmt with  statin    . HYPERTENSION 05/15/2008    Needs BB 2/2 CAD and ACEI 2/2 DM.     . Marland Kitchenverweight (BMI 25.0-29.9) 03/27/2012  . Tobacco use 03/27/2012    She has tried Chantix in past with intolerable side effects.   . Marland KitchenVA 09/23/2009    Left CEA May 2006 at same time as CABG 2010 : Acute left hemisphere infarct of posterior limb internal capsule and lateral thalamus.  Prior CVA's but dates unknown, imaging shows widespread multifocal remote lacunar infarcts throughout the brain and brainstem.  MRA 2010 : no severe proximal flow limiting stenosis.    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 aspirin 325 MG tablet Take 1 tablet (325 mg total) by mouth daily.  30 tablet  11  . Blood Glucose Monitoring Suppl (CONTOUR NEXT EZ MONITOR) W/DEVICE KIT 1 each by Does not apply route 2 (two) times daily.  1 kit  0  . diclofenac sodium (VOLTAREN) 1 % GEL Apply 2 g topically 4 (four) times daily.  100 g  2  . fesoterodine (TOVIAZ) 8 MG TB24 tablet Take 1 tablet (8 mg total) by mouth daily.  30 tablet    . glucose blood (BAYER CONTOUR NEXT TEST) test strip TWICE A DAY TESTING, 250.02  100 each  11  . ibuprofen (ADVIL,MOTRIN) 800 MG tablet Take 1 tablet (800 mg total) by mouth 2 (two) times daily as needed.  30 tablet  6  . Lancets 30G MISC 1 each by Does not apply route 2 (  two) times daily. DX CODE 250.02,  100 each  5  . lisinopril (PRINIVIL,ZESTRIL) 10 MG tablet take 1 tablet by mouth once daily  90 tablet  3  . metFORMIN (GLUCOPHAGE) 500 MG tablet Take 1 tablet (500 mg total) by mouth daily with breakfast.  90 tablet  3  . OVER THE COUNTER MEDICATION Take 1 capsule by mouth 3 (three) times daily. Omega XL 300 mg capsule      . rosuvastatin (CRESTOR) 10 MG tablet Take 1 tablet (10 mg total) by mouth at bedtime.  90 tablet  3  . [DISCONTINUED] tolterodine (DETROL LA) 4 MG 24 hr capsule Take 1 capsule (4 mg total) by mouth daily.  30 capsule  0     No current facility-administered medications for this visit.   No family history on file. History   Social History  . Marital Status: Single    Spouse Name: N/A    Number of Children: N/A  . Years of Education: 71   Social History Main Topics  . Smoking status: Current Every Day Smoker -- 0.50 packs/day    Types: Cigarettes  . Smokeless tobacco: Current User     Comment: trying to cut back.  . Alcohol Use: No  . Drug Use: No  . Sexual Activity: None   Other Topics Concern  . None   Social History Narrative   Vanessa grew up in [place]. Has daughter in [place]. Divorced. Lived in [place] about 2010 but then returned to [place] for a couple of yrs and returned to [place] in 2013. No family here but has friends. Some gait instability since CVA but no freq falls. Decreased vision in L eye. Has license but no car. Lives in apartment independently.     Objective:  Physical Exam: Filed Vitals:   02/11/14 1118  BP: 135/78  Pulse: 98  Temp: 98.4 F (36.9 C)  TempSrc: Oral  Weight: 187 lb 12.8 oz (85.186 kg)  SpO2: 93%   General: dysarthric, in wheelchair with walking cane in hand HEENT: PERRL, EOMI, no scleral icterus Cardiac: RRR, no rubs, murmurs or gallops Pulm: clear to auscultation bilaterally (coughing with deep breaths), moving normal volumes of air Abd: soft, nontender, nondistended, BS normoactive Ext: warm and well perfused, no pedal edema Neuro: alert and oriented X3, cranial nerves II-XII grossly intact  Assessment & Plan:  Case and care discussed with Dr. Dareen Piano.  Please see problem oriented charting for further details. Patient to return in 3 months for routine with PCP.

## 2014-02-12 NOTE — Progress Notes (Signed)
INTERNAL MEDICINE TEACHING ATTENDING ADDENDUM - Aldine Contes, MD: I reviewed and discussed at the time of visit with the resident Dr. Burnard Bunting, the patient's medical history, physical examination, diagnosis and results of tests and treatment and I agree with the patient's care as documented.

## 2014-03-07 ENCOUNTER — Other Ambulatory Visit: Payer: Self-pay | Admitting: Internal Medicine

## 2014-03-09 NOTE — Telephone Encounter (Signed)
Pls sch appt end of Aug / early sep routine F/U. Sent to front desk pool. Thanks

## 2014-04-22 ENCOUNTER — Encounter (HOSPITAL_COMMUNITY): Payer: Self-pay | Admitting: Emergency Medicine

## 2014-04-22 DIAGNOSIS — E663 Overweight: Secondary | ICD-10-CM | POA: Insufficient documentation

## 2014-04-22 DIAGNOSIS — Z7982 Long term (current) use of aspirin: Secondary | ICD-10-CM | POA: Insufficient documentation

## 2014-04-22 DIAGNOSIS — E119 Type 2 diabetes mellitus without complications: Secondary | ICD-10-CM | POA: Insufficient documentation

## 2014-04-22 DIAGNOSIS — Z880 Allergy status to penicillin: Secondary | ICD-10-CM | POA: Diagnosis not present

## 2014-04-22 DIAGNOSIS — I2510 Atherosclerotic heart disease of native coronary artery without angina pectoris: Secondary | ICD-10-CM | POA: Diagnosis not present

## 2014-04-22 DIAGNOSIS — Z791 Long term (current) use of non-steroidal anti-inflammatories (NSAID): Secondary | ICD-10-CM | POA: Insufficient documentation

## 2014-04-22 DIAGNOSIS — E785 Hyperlipidemia, unspecified: Secondary | ICD-10-CM | POA: Diagnosis not present

## 2014-04-22 DIAGNOSIS — F17200 Nicotine dependence, unspecified, uncomplicated: Secondary | ICD-10-CM | POA: Diagnosis not present

## 2014-04-22 DIAGNOSIS — Z9861 Coronary angioplasty status: Secondary | ICD-10-CM | POA: Diagnosis not present

## 2014-04-22 DIAGNOSIS — Z951 Presence of aortocoronary bypass graft: Secondary | ICD-10-CM | POA: Insufficient documentation

## 2014-04-22 DIAGNOSIS — I10 Essential (primary) hypertension: Secondary | ICD-10-CM | POA: Insufficient documentation

## 2014-04-22 DIAGNOSIS — Z79899 Other long term (current) drug therapy: Secondary | ICD-10-CM | POA: Insufficient documentation

## 2014-04-22 DIAGNOSIS — M7989 Other specified soft tissue disorders: Secondary | ICD-10-CM | POA: Diagnosis not present

## 2014-04-22 LAB — COMPREHENSIVE METABOLIC PANEL
ALK PHOS: 109 U/L (ref 39–117)
ALT: 12 U/L (ref 0–35)
AST: 14 U/L (ref 0–37)
Albumin: 3.7 g/dL (ref 3.5–5.2)
Anion gap: 15 (ref 5–15)
BUN: 10 mg/dL (ref 6–23)
CHLORIDE: 105 meq/L (ref 96–112)
CO2: 25 meq/L (ref 19–32)
Calcium: 9.4 mg/dL (ref 8.4–10.5)
Creatinine, Ser: 0.72 mg/dL (ref 0.50–1.10)
GFR, EST NON AFRICAN AMERICAN: 88 mL/min — AB (ref >90)
GLUCOSE: 233 mg/dL — AB (ref 70–99)
POTASSIUM: 3.1 meq/L — AB (ref 3.7–5.3)
Sodium: 145 mEq/L (ref 137–147)
Total Bilirubin: 0.2 mg/dL — ABNORMAL LOW (ref 0.3–1.2)
Total Protein: 7.8 g/dL (ref 6.0–8.3)

## 2014-04-22 LAB — CBC WITH DIFFERENTIAL/PLATELET
Basophils Absolute: 0 10*3/uL (ref 0.0–0.1)
Basophils Relative: 0 % (ref 0–1)
Eosinophils Absolute: 0 10*3/uL (ref 0.0–0.7)
Eosinophils Relative: 0 % (ref 0–5)
HCT: 36.8 % (ref 36.0–46.0)
Hemoglobin: 12.5 g/dL (ref 12.0–15.0)
LYMPHS ABS: 3.2 10*3/uL (ref 0.7–4.0)
Lymphocytes Relative: 28 % (ref 12–46)
MCH: 31.9 pg (ref 26.0–34.0)
MCHC: 34 g/dL (ref 30.0–36.0)
MCV: 93.9 fL (ref 78.0–100.0)
Monocytes Absolute: 0.9 10*3/uL (ref 0.1–1.0)
Monocytes Relative: 8 % (ref 3–12)
NEUTROS ABS: 7.2 10*3/uL (ref 1.7–7.7)
NEUTROS PCT: 64 % (ref 43–77)
Platelets: 351 10*3/uL (ref 150–400)
RBC: 3.92 MIL/uL (ref 3.87–5.11)
RDW: 12.7 % (ref 11.5–15.5)
WBC: 11.3 10*3/uL — AB (ref 4.0–10.5)

## 2014-04-22 LAB — CBG MONITORING, ED: Glucose-Capillary: 231 mg/dL — ABNORMAL HIGH (ref 70–99)

## 2014-04-22 LAB — PRO B NATRIURETIC PEPTIDE: PRO B NATRI PEPTIDE: 151.2 pg/mL — AB (ref 0–125)

## 2014-04-22 NOTE — ED Notes (Signed)
Pt states that she has been out of her medication for 2 days. Pt states that she has been feeling like her blood sugar is elevated but she also is having leg swelling in her right leg. Pt states unable to check sugar at home.

## 2014-04-23 ENCOUNTER — Emergency Department (HOSPITAL_COMMUNITY)
Admission: EM | Admit: 2014-04-23 | Discharge: 2014-04-23 | Disposition: A | Discharge status: Home / Self Care | Payer: Commercial Managed Care - HMO | Attending: Emergency Medicine | Admitting: Emergency Medicine

## 2014-04-23 ENCOUNTER — Ambulatory Visit (HOSPITAL_COMMUNITY): Admission: RE | Admit: 2014-04-23 | Payer: Medicare HMO | Source: Ambulatory Visit

## 2014-04-23 DIAGNOSIS — M7989 Other specified soft tissue disorders: Secondary | ICD-10-CM

## 2014-04-23 DIAGNOSIS — R739 Hyperglycemia, unspecified: Secondary | ICD-10-CM

## 2014-04-23 DIAGNOSIS — E119 Type 2 diabetes mellitus without complications: Secondary | ICD-10-CM | POA: Diagnosis not present

## 2014-04-23 LAB — URINALYSIS, ROUTINE W REFLEX MICROSCOPIC
Glucose, UA: >1000 mg/dL — AB
HGB URINE DIPSTICK: NEGATIVE
Ketones, ur: NEGATIVE mg/dL
NITRITE: NEGATIVE
PROTEIN: 30 mg/dL — AB
SPECIFIC GRAVITY, URINE: 1.024 (ref 1.005–1.030)
UROBILINOGEN UA: 2 mg/dL — AB (ref 0.0–1.0)
pH: 5.5 (ref 5.0–8.0)

## 2014-04-23 LAB — URINE MICROSCOPIC-ADD ON

## 2014-04-23 LAB — CBG MONITORING, ED: GLUCOSE-CAPILLARY: 329 mg/dL — AB (ref 70–99)

## 2014-04-23 MED ORDER — ENOXAPARIN SODIUM 150 MG/ML ~~LOC~~ SOLN: 1.5000 mg/kg | Freq: Once | SUBCUTANEOUS | Status: DC

## 2014-04-23 MED ORDER — ENOXAPARIN SODIUM 150 MG/ML ~~LOC~~ SOLN
1.5000 mg/kg | Freq: Once | SUBCUTANEOUS | Status: AC
  Administered 2014-04-23: 135 mg via SUBCUTANEOUS
  Filled 2014-04-23: qty 1

## 2014-04-23 MED ORDER — METFORMIN HCL 500 MG PO TABS
500.0000 mg | ORAL_TABLET | Freq: Once | ORAL | Status: AC
  Administered 2014-04-23: 500 mg via ORAL
  Filled 2014-04-23: qty 1

## 2014-04-23 NOTE — ED Provider Notes (Signed)
CSN: [id number]     Arrival date & time 04/22/14  2100 History   First MD Initiated Contact with Patient 04/23/14 0109     Chief Complaint  Patient presents with  . Hyperglycemia  . Leg Swelling     (Consider location/radiation/quality/duration/timing/severity/associated sxs/prior Treatment) HPI Pt presenting with c/o concern that her blood sugar is high due to being out of her metformin. She states that she is waiting for the prescription to come in from mail order.  She also c/o right leg swelling.  She states the swelling has been present for at least 2 weeks. No leg pain.  No chest pain or shortness of breath.  No fever/chills.  She has not checked her blood sugar at home.  NO hx of DVT/PE.  There are no other associated systemic symptoms, there are no other alleviating or modifying factors.   Past Medical History  Diagnosis Date  . CORONARY ARTERY DISEASE 05/15/2008    CABG 2006, DES to the saphenous vein graft to the ramus in December of 2006.  ECHO 2010 : basal inferior wall akinesis. Post-op septal wall dyssynchrony. Grade 1diastolic dysfunction.       [name] DIABETES MELLITUS, TYPE II 05/15/2008    Well controlled on oral meds.    [name] HYPERLIPIDEMIA 05/11/2008    Mgmt with statin    . HYPERTENSION 05/15/2008    Needs BB 2/2 CAD and ACEI 2/2 DM.     [name] Overweight (BMI 25.0-29.9) 03/27/2012  . Tobacco use 03/27/2012    She has tried Chantix in past with intolerable side effects.   [name] CVA 09/23/2009    Left CEA May 2006 at same time as CABG 2010 : Acute left hemisphere infarct of posterior limb internal capsule and lateral thalamus.  Prior CVA's but dates unknown, imaging shows widespread multifocal remote lacunar infarcts throughout the brain and brainstem.  MRA 2010 : no severe proximal flow limiting stenosis.    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 Coronary artery bypass graft  May 2006    Dr Servando Snare  . Abdominal hysterectomy     . Cholecystectomy    . Back surgery      lumbar L3-L5  . Exploratory laparotomy  7/03    Removed the cervix and now has vaginal cuff  . Carotid endarterectomy  May 2006    Left - at same time as CABG. Dr Tenna Delaine.  . Angioplasty / stenting femoral  11/03/2010    B aorto-fem angiogram with artherectomy and stenting of the R SFA 2/2 claudication.  . Cystoscopy  01/2013    Normal. Dr Matilde Sprang   History reviewed. No pertinent family history. History  Substance Use Topics  . Smoking status: Current Every Day Smoker -- 0.50 packs/day    Types: Cigarettes  . Smokeless tobacco: Current User     Comment: trying to cut back.  . Alcohol Use: No   OB History   Grav Para Term Preterm Abortions TAB SAB Ect Mult Living                 Review of Systems ROS reviewed and all otherwise negative except for mentioned in HPI    Allergies  Chantix; Codeine; Codeine phosphate; and Penicillins  Home Medications   Prior to Admission medications   Medication Sig Start Date End Date Taking? Authorizing Provider  amLODipine (NORVASC) 10 MG tablet Take 10 mg by mouth daily.   Yes Historical  Provider, MD  aspirin 325 MG tablet Take 325 mg by mouth daily.   Yes Historical Provider, MD  fesoterodine (TOVIAZ) 8 MG TB24 tablet Take 1 tablet (8 mg total) by mouth daily. 06/12/13  Yes Bartholomew Crews, MD  ibuprofen (ADVIL,MOTRIN) 800 MG tablet Take 800 mg by mouth every 8 (eight) hours as needed for mild pain.   Yes Historical Provider, MD  lisinopril (PRINIVIL,ZESTRIL) 10 MG tablet Take 10 mg by mouth daily.   Yes Historical Provider, MD  metFORMIN (GLUCOPHAGE) 500 MG tablet Take 500 mg by mouth 2 (two) times daily with a meal.   Yes Historical Provider, MD  rosuvastatin (CRESTOR) 10 MG tablet Take 10 mg by mouth daily.   Yes Historical Provider, MD   BP 143/68  Pulse 84  Temp(Src) 98.8 F (37.1 C) (Oral)  Resp 20  Ht 5\' 7"  (1.702 m)  Wt 197 lb (89.359 kg)  BMI 30.85 kg/m2  SpO2  98% Vitals reviewed Physical Exam Physical Examination: General appearance - alert, well appearing, and in no distress Mental status - alert, oriented to person, place, and time Eyes - no conjunctival injection, no scleral icterus Mouth - mucous membranes moist, pharynx normal without lesions Chest - clear to auscultation, no wheezes, rales or rhonchi, symmetric air entry Heart - normal rate, regular rhythm, normal S1, S2, no murmurs, rubs, clicks or gallops Abdomen - soft, nontender, nondistended, no masses or organomegaly Extremities - peripheral pulses normal, mild 1+ pedal edema nonpitting of right lower extremity,  no clubbing or cyanosis Skin - normal coloration and turgor, no rashes  ED Course  Procedures (including critical care time) Labs Review Labs Reviewed  URINALYSIS, ROUTINE W REFLEX MICROSCOPIC - Abnormal; Notable for the following:    APPearance CLOUDY (*)    Glucose, UA >1000 (*)    Bilirubin Urine SMALL (*)    Protein, ur 30 (*)    Urobilinogen, UA 2.0 (*)    Leukocytes, UA SMALL (*)    All other components within normal limits  CBC WITH DIFFERENTIAL - Abnormal; Notable for the following:    WBC 11.3 (*)    All other components within normal limits  COMPREHENSIVE METABOLIC PANEL - Abnormal; Notable for the following:    Potassium 3.1 (*)    Glucose, Bld 233 (*)    Total Bilirubin 0.2 (*)    GFR calc non Af Amer 88 (*)    All other components within normal limits  PRO B NATRIURETIC PEPTIDE - Abnormal; Notable for the following:    Pro B Natriuretic peptide (BNP) 151.2 (*)    All other components within normal limits  URINE MICROSCOPIC-ADD ON - Abnormal; Notable for the following:    Squamous Epithelial / LPF FEW (*)    Bacteria, UA MANY (*)    All other components within normal limits  CBG MONITORING, ED - Abnormal; Notable for the following:    Glucose-Capillary 231 (*)    All other components within normal limits  CBG MONITORING, ED - Abnormal; Notable  for the following:    Glucose-Capillary 329 (*)    All other components within normal limits  URINE CULTURE    Imaging Review No results found.   EKG Interpretation None      MDM   Final diagnoses:  Hyperglycemia  Swelling of lower extremity    Pt presenting with c/o right lower extremity swelling and hyperglycemia.  Blood sugar 231 in the ED. No signs of DKA. Pt given a dose of metformin,  she states her prescription is in the mail to her now for a refill.  She also has mild lower extremity swelling on the right.  Pt given first dose of lovenox and scheduled for DVT study tomorrow.  No signs of arterial occlusion- no pallor, pain, pulselessness.  Discharged with strict return precautions.  Pt agreeable with plan.    Threasa Beards, MD 04/24/14 [phone]

## 2014-04-23 NOTE — Discharge Instructions (Signed)
Return to the ED with any concerns including chest pain, shortness of breath, increased leg swelling, decreased level of alertness/lethargy, or any other alarming symptoms  You should return tomorrow for a lower extremity ultrasound to examine for a DVT

## 2014-04-24 ENCOUNTER — Ambulatory Visit (HOSPITAL_COMMUNITY)
Admission: RE | Admit: 2014-04-24 | Discharge: 2014-04-24 | Disposition: A | Discharge status: Home / Self Care | Payer: Medicare HMO | Source: Ambulatory Visit | Attending: Emergency Medicine | Admitting: Emergency Medicine

## 2014-04-24 DIAGNOSIS — M7989 Other specified soft tissue disorders: Secondary | ICD-10-CM | POA: Insufficient documentation

## 2014-04-24 NOTE — Progress Notes (Signed)
*  PRELIMINARY RESULTS* Vascular Ultrasound Right lower extremity venous duplex has been completed.  Preliminary findings: No evidence of DVT in visualized veins. No baker's cyst noted.  Landry Mellow, RDMS, RVT  04/24/2014, 11:33 AM

## 2014-04-25 LAB — URINE CULTURE: Colony Count: >=100000

## 2014-04-26 NOTE — Progress Notes (Signed)
ED Antimicrobial Stewardship Positive Culture Follow Up   Vanessa Calderon is an 68 y.o. female who presented to [facility] on 04/23/2014 with a chief complaint of  Chief Complaint  Patient presents with  . Hyperglycemia  . Leg Swelling    Recent Results (from the past 720 hour(s))  URINE CULTURE     Status: None   Collection Time    04/23/14 12:31 AM      Result Value Ref Range Status   Specimen Description URINE, CLEAN CATCH   Final   Special Requests NONE   Final   Culture  Setup Time     Final   Value: 04/23/2014 15:02     Performed at [place]     Final   Value: >=100,000 COLONIES/ML     Performed at [facility]   Culture     Final   Value: ESCHERICHIA COLI     Performed at [facility]   Report Status 04/25/2014 FINAL   Final   Organism ID, Bacteria ESCHERICHIA COLI   Final    Asymptomatic bacteriuria - no dysuria noted, squamous cells in urine micro.  No treatment needed.  ED Provider: Noland Fordyce, PA-C   Norva Riffle 04/26/2014, 10:32 AM Infectious Diseases Pharmacist Phone# [phone]

## 2014-05-06 ENCOUNTER — Other Ambulatory Visit: Payer: Self-pay | Admitting: *Deleted

## 2014-05-06 MED ORDER — ROSUVASTATIN CALCIUM 10 MG PO TABS: 10.0000 mg | ORAL_TABLET | Freq: Every day | ORAL | Status: DC

## 2014-05-06 MED ORDER — LISINOPRIL 10 MG PO TABS: 10.0000 mg | ORAL_TABLET | Freq: Every day | ORAL | Status: AC

## 2014-05-08 ENCOUNTER — Other Ambulatory Visit: Payer: Self-pay | Admitting: Internal Medicine

## 2014-05-08 NOTE — Telephone Encounter (Signed)
At my last appt with her, she told me she was not taking her BB so it was stopped. Pls sch her an appt next week with [facility] and Dr Maudie Mercury (if available) and have her bring all her med bottles to do med rec. Then when I see her 9/3, I will know what she is and is not taking and can make changes appropriately. Thanks

## 2014-05-08 NOTE — Telephone Encounter (Signed)
Message sent to front office to schedule pt an appt. 

## 2014-05-21 ENCOUNTER — Encounter: Payer: Self-pay | Admitting: Internal Medicine

## 2014-05-21 ENCOUNTER — Ambulatory Visit (INDEPENDENT_AMBULATORY_CARE_PROVIDER_SITE_OTHER): Payer: BC Managed Care – PPO | Admitting: Internal Medicine

## 2014-05-21 VITALS — BP 137/67 | HR 101 | Temp 98.8°F | Wt 185.1 lb

## 2014-05-21 DIAGNOSIS — E785 Hyperlipidemia, unspecified: Secondary | ICD-10-CM | POA: Diagnosis not present

## 2014-05-21 DIAGNOSIS — I6350 Cerebral infarction due to unspecified occlusion or stenosis of unspecified cerebral artery: Secondary | ICD-10-CM

## 2014-05-21 DIAGNOSIS — E663 Overweight: Secondary | ICD-10-CM

## 2014-05-21 DIAGNOSIS — E119 Type 2 diabetes mellitus without complications: Secondary | ICD-10-CM | POA: Diagnosis not present

## 2014-05-21 DIAGNOSIS — R32 Unspecified urinary incontinence: Secondary | ICD-10-CM

## 2014-05-21 DIAGNOSIS — I2510 Atherosclerotic heart disease of native coronary artery without angina pectoris: Secondary | ICD-10-CM

## 2014-05-21 DIAGNOSIS — Z0000 Encounter for general adult medical examination without abnormal findings: Secondary | ICD-10-CM

## 2014-05-21 DIAGNOSIS — I10 Essential (primary) hypertension: Secondary | ICD-10-CM

## 2014-05-21 LAB — BASIC METABOLIC PANEL WITH GFR
BUN: 10 mg/dL (ref 6–23)
CHLORIDE: 102 meq/L (ref 96–112)
CO2: 27 meq/L (ref 19–32)
Calcium: 9.2 mg/dL (ref 8.4–10.5)
Creat: 0.75 mg/dL (ref 0.50–1.10)
GFR, EST NON AFRICAN AMERICAN: 83 mL/min
GFR, Est African American: >89 mL/min
Glucose, Bld: 307 mg/dL — ABNORMAL HIGH (ref 70–99)
Potassium: 3.4 mEq/L — ABNORMAL LOW (ref 3.5–5.3)
Sodium: 138 mEq/L (ref 135–145)

## 2014-05-21 LAB — GLUCOSE, CAPILLARY: Glucose-Capillary: 292 mg/dL — ABNORMAL HIGH (ref 70–99)

## 2014-05-21 LAB — LIPID PANEL
Cholesterol: 181 mg/dL (ref 0–200)
HDL: 41 mg/dL (ref >39)
LDL CALC: 116 mg/dL — AB (ref 0–99)
Total CHOL/HDL Ratio: 4.4 Ratio
Triglycerides: 122 mg/dL (ref <150)
VLDL: 24 mg/dL (ref 0–40)

## 2014-05-21 LAB — POCT GLYCOSYLATED HEMOGLOBIN (HGB A1C): HEMOGLOBIN A1C: 7.6

## 2014-05-21 MED ORDER — METOPROLOL SUCCINATE ER 25 MG PO TB24: 25.0000 mg | ORAL_TABLET | Freq: Every day | ORAL | Status: AC

## 2014-05-21 MED ORDER — ROSUVASTATIN CALCIUM 10 MG PO TABS: 10.0000 mg | ORAL_TABLET | Freq: Every day | ORAL | Status: DC

## 2014-05-21 NOTE — Progress Notes (Signed)
   Subjective:    Patient ID: RHEALYN CULLEN, female    DOB: 21-Oct-1945, 68 y.o.   MRN: [id number]  Diabetes Associated symptoms include fatigue.  Eye Problem     Please see the A&P for the status of the pt's chronic medical problems.   Review of Systems  Constitutional: Positive for diaphoresis and fatigue. Negative for activity change, appetite change and unexpected weight change.  Genitourinary:       + nocturia  Hematological: Negative for adenopathy.  Psychiatric/Behavioral: Positive for sleep disturbance.       Objective:   Physical Exam  Constitutional: She is oriented to person, place, and time. She appears well-developed and well-nourished. No distress.  In [place]. Walked I from room to restroom.  HENT:  Head: Normocephalic and atraumatic.  Right Ear: External ear normal.  Left Ear: External ear normal.  Nose: Nose normal.  Eyes: Conjunctivae and EOM are normal.  Neurological: She is alert and oriented to person, place, and time.  Skin: Skin is warm and dry. She is not diaphoretic.  Psychiatric: She has a normal mood and affect. Her behavior is normal. Judgment and thought content normal.          Assessment & Plan:

## 2014-05-21 NOTE — Assessment & Plan Note (Signed)
Does not take her statin. I sent in an Rx.

## 2014-05-21 NOTE — Assessment & Plan Note (Addendum)
Her first statement to me was "I need something to sleep." On questioning, she lays down at 7 PM and falls asleep at 8:30 or 9. She wakes up at 11PM and spends about 1 hour in bathroom to urinate then falls asleep about 12:30 or 1 AM. She then sleeps until 7 AM. This is a total of 7 hrs of sleep which is prob nl for a 68 yo. She is tired during day but denies taking naps. I encouraged her to go to bed at 8:30 instead and exercise early in day.  She c/o excessive sweating. She states it occurs day and night and cause her to change clothes once in day. She states this occurs inside in [facility]. She has no visible diaphoresis now. She has had intentional wt loss. Denies LAD. I encouraged her get her MMG as due this month, get a colonoscopy (not interested at this time). She has had TAH so doubt Gyn cause. Otherwise, will F/U.   Overall, she is doing great in terms of DM and HTN. But, she is not taking life saving meds - BB and ASA and Statin. She doesn't seem to be making great healthcare choices. But is competent. Not homebound so home health services not possible. Edwena Blow will ask [facility] to do home med mgmt.

## 2014-05-21 NOTE — Patient Instructions (Addendum)
1. Continue taking you metformin - your sugar is doing well. 2. Continue taking your lisnopril - your BP is doing well 3. You need to take  An aspirin 81 mg a day for your heart and brain  Toprol XL for your heart  Crestor for your heart and brain 4. See me in one month 5. You need to get your mammogram 6. You do need a colonoscopy to screen for colon cancer - let me know when you are ready to do this

## 2014-05-21 NOTE — Assessment & Plan Note (Signed)
At first she said she was not taking her Lisbeth Ply then she said she was.

## 2014-05-21 NOTE — Assessment & Plan Note (Signed)
She is actually doing quite well on her DM. She takes her metformin (by her report) and her weight is intentionally down 12 lbs and her A1C is good. Will not make any changes.

## 2014-05-21 NOTE — Assessment & Plan Note (Signed)
I have asked her again to take an ASA.

## 2014-05-21 NOTE — Assessment & Plan Note (Signed)
She is taking her lisinopril but not BB. BP actually great. Add BB since CAD.

## 2014-05-21 NOTE — Assessment & Plan Note (Signed)
Intentional 12 lb weight loss (regression to mean though it appears).   Wt Readings from Last 3 Encounters:  05/21/14 185 lb 1.6 oz (83.961 kg)  04/22/14 197 lb (89.359 kg)  02/11/14 187 lb 12.8 oz (85.186 kg)

## 2014-05-21 NOTE — Assessment & Plan Note (Signed)
Despite many Rx's and requests to take BB, statin, and ASA she is not taking any of these. I sent in Rx for Toprol XL for once daily to increase compliance. I also sent in Rx for statin and asked her again to take ASA.

## 2014-05-22 ENCOUNTER — Telehealth: Payer: Self-pay | Admitting: Licensed Clinical Social Worker

## 2014-05-22 ENCOUNTER — Other Ambulatory Visit: Payer: Self-pay | Admitting: Internal Medicine

## 2014-05-22 ENCOUNTER — Other Ambulatory Visit: Payer: Self-pay | Admitting: Licensed Clinical Social Worker

## 2014-05-22 DIAGNOSIS — I10 Essential (primary) hypertension: Secondary | ICD-10-CM

## 2014-05-22 MED ORDER — POTASSIUM CHLORIDE ER 20 MEQ PO TBCR: 40.0000 meq | EXTENDED_RELEASE_TABLET | Freq: Every day | ORAL | Status: DC

## 2014-05-22 NOTE — Telephone Encounter (Signed)
Vanessa Calderon was referred to CSW as pt reported not take her beta-blocker, statin, or ASA during her scheduled [facility] visit.  These medications are available at low cost/generic form.  CSW placed call to Vanessa Calderon to determine possible barriers.  Vanessa Calderon states there are certain medication she is not taking, but unable to state which ones those are over the phone.  Discussed referral to [facility], to have pharmacist/nursing go to patient home to determine exactly what pt is or is not taking.  Vanessa Calderon in agreement.  Referral to [facility] completed.

## 2014-06-19 ENCOUNTER — Encounter: Payer: Self-pay | Admitting: Internal Medicine

## 2014-06-19 ENCOUNTER — Ambulatory Visit (INDEPENDENT_AMBULATORY_CARE_PROVIDER_SITE_OTHER): Payer: Medicare HMO | Admitting: Internal Medicine

## 2014-06-19 ENCOUNTER — Other Ambulatory Visit: Payer: Self-pay | Admitting: Dietician

## 2014-06-19 VITALS — BP 153/67 | HR 95 | Temp 98.3°F | Ht 66.0 in | Wt 175.8 lb

## 2014-06-19 DIAGNOSIS — Z0000 Encounter for general adult medical examination without abnormal findings: Secondary | ICD-10-CM | POA: Diagnosis not present

## 2014-06-19 DIAGNOSIS — E119 Type 2 diabetes mellitus without complications: Secondary | ICD-10-CM

## 2014-06-19 DIAGNOSIS — E669 Obesity, unspecified: Principal | ICD-10-CM

## 2014-06-19 DIAGNOSIS — E1169 Type 2 diabetes mellitus with other specified complication: Secondary | ICD-10-CM

## 2014-06-19 DIAGNOSIS — I10 Essential (primary) hypertension: Secondary | ICD-10-CM | POA: Diagnosis not present

## 2014-06-19 DIAGNOSIS — E876 Hypokalemia: Secondary | ICD-10-CM | POA: Diagnosis not present

## 2014-06-19 LAB — BASIC METABOLIC PANEL WITH GFR
BUN: 5 mg/dL — ABNORMAL LOW (ref 6–23)
CALCIUM: 10 mg/dL (ref 8.4–10.5)
CO2: 27 meq/L (ref 19–32)
Chloride: 108 mEq/L (ref 96–112)
Creat: 0.96 mg/dL (ref 0.50–1.10)
GFR, EST NON AFRICAN AMERICAN: 62 mL/min
GFR, Est African American: 71 mL/min
Glucose, Bld: 202 mg/dL — ABNORMAL HIGH (ref 70–99)
Potassium: 4.2 mEq/L (ref 3.5–5.3)
SODIUM: 146 meq/L — AB (ref 135–145)

## 2014-06-19 LAB — HM DIABETES EYE EXAM

## 2014-06-19 LAB — GLUCOSE, CAPILLARY: GLUCOSE-CAPILLARY: 219 mg/dL — AB (ref 70–99)

## 2014-06-19 MED ORDER — METFORMIN HCL 500 MG PO TABS: 500.0000 mg | ORAL_TABLET | Freq: Two times a day (BID) | ORAL | Status: AC

## 2014-06-19 NOTE — Progress Notes (Signed)
INTERNAL MEDICINE TEACHING ATTENDING ADDENDUM - Williard Keller, MD: I reviewed and discussed at the time of visit with the resident Dr. Glenn, the patient's medical history, physical examination, diagnosis and results of pertinent tests and treatment and I agree with the patient's care as documented.  

## 2014-06-19 NOTE — Addendum Note (Signed)
Addended by: Resa Miner on: 06/19/2014 12:31 PM   Modules accepted: Orders

## 2014-06-19 NOTE — Assessment & Plan Note (Addendum)
CBG elevated in the clinic today but she has been out of Metformin x1 week. Refilling today: Metformin 500mg  BID, 2 refills

## 2014-06-19 NOTE — Progress Notes (Signed)
Patient ID: Vanessa Calderon, female   DOB: 1946-02-25, 68 y.o.   MRN: [id number]  Subjective:   Patient ID: Vanessa Calderon female   DOB: [birth year] 68 y.o.   MRN: [id number]  HPI: [name] is a 68 y.o. F w/ PMH DM2, HLD, HTn, and CAD who presents for a f/u due to concerns for medication compliance by her PCP.   Pt was seen by PCP 9/3 and was not taking her BB, ASA, or statin. All of theses medications were called into her pharmacy. [facility] was consulted to help with home medication management. She states that they have not been out to see her yet.   Today she states that she is doing well. She is possibly taking all of her medications as prescribed, but has been out of her metformin x1 week. CBG 219 in the clinic this morning. She initially told me that she is not taking the lisinopril, but later tells me that she took it this morning but did not take the Metoprolol this morning but has been taking it daily.   Her blood pressure is elevated today in the clinic, but she did not take th BB this am.   She denies headaches, vision changes, fevers, chills, chest pain, palpitations, SOB, abdominal pain, N/V, changes in bowel habits, blood in her stool, changes in urination, or swelling in her legs.   She does endorse a cough and urinary incontinence.    Past Medical History  Diagnosis Date  . CORONARY ARTERY DISEASE 05/15/2008    CABG 2006, DES to the saphenous vein graft to the ramus in December of 2006.  ECHO 2010 : basal inferior wall akinesis. Post-op septal wall dyssynchrony. Grade 1diastolic dysfunction.       [name] DIABETES MELLITUS, TYPE II 05/15/2008    Well controlled on oral meds.    [name] HYPERLIPIDEMIA 05/11/2008    Mgmt with statin    . HYPERTENSION 05/15/2008    Needs BB 2/2 CAD and ACEI 2/2 DM.     [name] Overweight (BMI 25.0-29.9) 03/27/2012  . Tobacco use 03/27/2012    She has tried Chantix in past with intolerable side effects.   [name] CVA 09/23/2009    Left CEA May 2006 at same time as  CABG 2010 : Acute left hemisphere infarct of posterior limb internal capsule and lateral thalamus.  Prior CVA's but dates unknown, imaging shows widespread multifocal remote lacunar infarcts throughout the brain and brainstem.  MRA 2010 : no severe proximal flow limiting stenosis.    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 aspirin 325 MG tablet Take 325 mg by mouth daily.      [name] lisinopril (PRINIVIL,ZESTRIL) 10 MG tablet Take 1 tablet (10 mg total) by mouth daily.  90 tablet  1  . metoprolol succinate (TOPROL XL) 25 MG 24 hr tablet Take 1 tablet (25 mg total) by mouth daily.  90 tablet  0  . rosuvastatin (CRESTOR) 10 MG tablet Take 1 tablet (10 mg total) by mouth daily.  90 tablet  0  . fesoterodine (TOVIAZ) 8 MG TB24 tablet Take 1 tablet (8 mg total) by mouth daily.  30 tablet    . ibuprofen (ADVIL,MOTRIN) 800 MG tablet Take 800 mg by mouth every 8 (eight) hours as needed for mild pain.      . metFORMIN (GLUCOPHAGE) 500 MG tablet Take 1 tablet (500 mg total) by mouth 2 (two) times  daily with a meal.  60 tablet  2  . Potassium Chloride ER 20 MEQ TBCR Take 40 mEq by mouth daily.  4 tablet  0  . [DISCONTINUED] tolterodine (DETROL LA) 4 MG 24 hr capsule Take 1 capsule (4 mg total) by mouth daily.  30 capsule  0   No current facility-administered medications for this visit.   No family history on file. History   Social History  . Marital Status: Single    Spouse Name: N/A    Number of Children: N/A  . Years of Education: 59   Social History Main Topics  . Smoking status: Current Every Day Smoker -- 0.50 packs/day    Types: Cigarettes  . Smokeless tobacco: Current User     Comment: trying to cut back.  . Alcohol Use: No  . Drug Use: No  . Sexual Activity: None   Other Topics Concern  . None   Social History Narrative   Pt grew up in [place]. Has daughter in [place]. Divorced. Lived in [place] about 2010  but then returned to [place] for a couple of yrs and returned to [place] in 2013. No family here but has friends. Some gait instability since CVA but no freq falls. Decreased vision in L eye. Has license but no car. Lives in apartment independently.    Review of Systems: A 12 point ROS was performed; pertinent positives and negatives were noted in the HPI   Objective:  Physical Exam: Filed Vitals:   06/19/14 1058  BP: 153/67  Pulse: 95  Temp: 98.3 F (36.8 C)  TempSrc: Oral  Height: 5\' 6"  (1.676 m)  Weight: 175 lb 12.8 oz (79.742 kg)  SpO2: 97%   Constitutional: Vital signs reviewed.  Patient is a well-developed and well-nourished female in no acute distress and cooperative with exam. Alert and oriented x3.  Head: Normocephalic and atraumatic Mouth: MMM Eyes: PERRL, EOMI Cardiovascular: RRR, no MRG, pulses symmetric and intact bilaterally Pulmonary/Chest: Normal respiratory effort, CTAB, no wheezes, rales, or rhonchi. +productive cough Abdominal: Soft. Non-tender, non-distended, bowel sounds are normal Musculoskeletal: Moves all 4 extremities. Able to ambulate with cane. Neurological: A&O x3. Psychiatric: Normal mood and affect.  Assessment & Plan:   Please refer to Problem List based Assessment and Plan

## 2014-06-19 NOTE — Assessment & Plan Note (Addendum)
There is some confusion regarding what medications she is and is not taking. Initially she told me that she was taking the metoprolol and not the lisinopril, but she later told me that she took the lisinopril this morning but not the metoprolol. Will discuss with out CSW to see if [facility] is following the patient. Will also ask our clinic pharmacist to assist in improving medication compliance for the patient.  [facility] nursing for medication assistance.

## 2014-06-19 NOTE — Patient Instructions (Addendum)
**  Be sure to take your medications as prescribed. I am asking a home health nurse to come to your home to help you with your medications.    General Instructions:   Please bring your medicines with you each time you come to clinic.  Medicines may include prescription medications, over-the-counter medications, herbal remedies, eye drops, vitamins, or other pills.   Progress Toward Treatment Goals:  Treatment Goal 10/21/2013  Hemoglobin A1C at goal  Blood pressure unchanged  Stop smoking -  Prevent falls -    Self Care Goals & Plans:  Self Care Goal 06/19/2014  Manage my medications take my medicines as prescribed; bring my medications to every visit; refill my medications on time; follow the sick day instructions if I am sick  Monitor my health keep track of my blood glucose; check my feet daily  Eat healthy foods eat more vegetables; eat fruit for snacks and desserts; eat foods that are low in salt; eat baked foods instead of fried foods; eat smaller portions; drink diet soda or water instead of juice or soda  Be physically active find an activity I enjoy  Stop smoking -    Home Blood Glucose Monitoring 10/21/2013  Check my blood sugar once a day  When to check my blood sugar before breakfast     Care Management & Community Referrals:  Referral 10/21/2013  Referrals made for care management support [facility] case management program  Referrals made to community resources -

## 2014-06-19 NOTE — Assessment & Plan Note (Signed)
  Pt getting retinal exam today.

## 2014-06-19 NOTE — Assessment & Plan Note (Addendum)
K 3.4 05/21/14 and 3.1 04/22/14. There seems to be concern by the PCP regarding prescribing KCl supplementation, and she was given 40mEq x2 on 9/3. I am honestly not sure if she is taking her lisinopril, which can help to increase her serum potassium. Will recheck potassium today, and if it is low, will discuss with her PCP regarding supplementation.

## 2014-06-22 ENCOUNTER — Encounter: Payer: Self-pay | Admitting: Licensed Clinical Social Worker

## 2014-06-22 ENCOUNTER — Encounter: Payer: Self-pay | Admitting: *Deleted

## 2014-06-22 NOTE — Progress Notes (Signed)
Patient ID: Vanessa Calderon, female   DOB: 1945-11-04, 68 y.o.   MRN: [id number] Emailed Received on 10/2 at 1357  You referred Jayln Branscom Oct 23, 1945 to [facility].  We have been seeing this patient and I have faxed my last notes on 05/19/14.  Patient is telling her MD she has not seen [facility], per notes today's visit 06/19/14.  Patient had moved in August and RNCM did not have her address. Have seen her and is following.  MD notes were reviewed.  Please follow up with me. Thanks, Barbie Banner, RN BSN CCM Care Management Coordinator [facility]       [address] [place]                                    [place], [place] [address] [phone] business mobile phone [phone] fax/Office number [phone]

## 2014-06-24 ENCOUNTER — Encounter: Payer: Self-pay | Admitting: *Deleted

## 2014-06-26 ENCOUNTER — Emergency Department (HOSPITAL_COMMUNITY)
Admission: EM | Admit: 2014-06-26 | Discharge: 2014-06-26 | Discharge status: Against Medical Advice | Payer: Medicare HMO | Attending: Emergency Medicine | Admitting: Emergency Medicine

## 2014-06-26 ENCOUNTER — Encounter (HOSPITAL_COMMUNITY): Payer: Self-pay | Admitting: Emergency Medicine

## 2014-06-26 ENCOUNTER — Emergency Department (HOSPITAL_COMMUNITY): Payer: Medicare HMO

## 2014-06-26 DIAGNOSIS — N390 Urinary tract infection, site not specified: Secondary | ICD-10-CM

## 2014-06-26 DIAGNOSIS — E785 Hyperlipidemia, unspecified: Secondary | ICD-10-CM | POA: Diagnosis not present

## 2014-06-26 DIAGNOSIS — Z9889 Other specified postprocedural states: Secondary | ICD-10-CM | POA: Diagnosis not present

## 2014-06-26 DIAGNOSIS — I2510 Atherosclerotic heart disease of native coronary artery without angina pectoris: Secondary | ICD-10-CM | POA: Diagnosis not present

## 2014-06-26 DIAGNOSIS — R05 Cough: Secondary | ICD-10-CM | POA: Diagnosis present

## 2014-06-26 DIAGNOSIS — E876 Hypokalemia: Secondary | ICD-10-CM | POA: Insufficient documentation

## 2014-06-26 DIAGNOSIS — Z8673 Personal history of transient ischemic attack (TIA), and cerebral infarction without residual deficits: Secondary | ICD-10-CM | POA: Diagnosis not present

## 2014-06-26 DIAGNOSIS — E663 Overweight: Secondary | ICD-10-CM | POA: Diagnosis not present

## 2014-06-26 DIAGNOSIS — Z79899 Other long term (current) drug therapy: Secondary | ICD-10-CM | POA: Insufficient documentation

## 2014-06-26 DIAGNOSIS — I10 Essential (primary) hypertension: Secondary | ICD-10-CM | POA: Insufficient documentation

## 2014-06-26 DIAGNOSIS — Z951 Presence of aortocoronary bypass graft: Secondary | ICD-10-CM | POA: Insufficient documentation

## 2014-06-26 DIAGNOSIS — R9431 Abnormal electrocardiogram [ECG] [EKG]: Secondary | ICD-10-CM | POA: Insufficient documentation

## 2014-06-26 DIAGNOSIS — Z720 Tobacco use: Secondary | ICD-10-CM | POA: Diagnosis not present

## 2014-06-26 DIAGNOSIS — Z7982 Long term (current) use of aspirin: Secondary | ICD-10-CM | POA: Insufficient documentation

## 2014-06-26 DIAGNOSIS — R918 Other nonspecific abnormal finding of lung field: Secondary | ICD-10-CM

## 2014-06-26 DIAGNOSIS — R222 Localized swelling, mass and lump, trunk: Secondary | ICD-10-CM | POA: Insufficient documentation

## 2014-06-26 DIAGNOSIS — E119 Type 2 diabetes mellitus without complications: Secondary | ICD-10-CM | POA: Diagnosis not present

## 2014-06-26 LAB — BASIC METABOLIC PANEL
ANION GAP: 15 (ref 5–15)
BUN: 12 mg/dL (ref 6–23)
CHLORIDE: 100 meq/L (ref 96–112)
CO2: 28 mEq/L (ref 19–32)
Calcium: 9.8 mg/dL (ref 8.4–10.5)
Creatinine, Ser: 1.12 mg/dL — ABNORMAL HIGH (ref 0.50–1.10)
GFR calc non Af Amer: 50 mL/min — ABNORMAL LOW (ref >90)
GFR, EST AFRICAN AMERICAN: 58 mL/min — AB (ref >90)
Glucose, Bld: 220 mg/dL — ABNORMAL HIGH (ref 70–99)
Potassium: 2.7 mEq/L — CL (ref 3.7–5.3)
SODIUM: 143 meq/L (ref 137–147)

## 2014-06-26 LAB — I-STAT TROPONIN, ED: Troponin i, poc: 0 ng/mL (ref 0.00–0.08)

## 2014-06-26 LAB — CBC WITH DIFFERENTIAL/PLATELET
BASOS ABS: 0 10*3/uL (ref 0.0–0.1)
Basophils Relative: 0 % (ref 0–1)
Eosinophils Absolute: 0 10*3/uL (ref 0.0–0.7)
Eosinophils Relative: 0 % (ref 0–5)
HEMATOCRIT: 37.6 % (ref 36.0–46.0)
Hemoglobin: 12.6 g/dL (ref 12.0–15.0)
Lymphocytes Relative: 18 % (ref 12–46)
Lymphs Abs: 3.1 10*3/uL (ref 0.7–4.0)
MCH: 31.3 pg (ref 26.0–34.0)
MCHC: 33.5 g/dL (ref 30.0–36.0)
MCV: 93.5 fL (ref 78.0–100.0)
Monocytes Absolute: 1.2 10*3/uL — ABNORMAL HIGH (ref 0.1–1.0)
Monocytes Relative: 7 % (ref 3–12)
NEUTROS ABS: 13.1 10*3/uL — AB (ref 1.7–7.7)
Neutrophils Relative %: 75 % (ref 43–77)
PLATELETS: 353 10*3/uL (ref 150–400)
RBC: 4.02 MIL/uL (ref 3.87–5.11)
RDW: 12.6 % (ref 11.5–15.5)
WBC: 17.6 10*3/uL — AB (ref 4.0–10.5)

## 2014-06-26 LAB — URINALYSIS, ROUTINE W REFLEX MICROSCOPIC
BILIRUBIN URINE: NEGATIVE
Glucose, UA: NEGATIVE mg/dL
KETONES UR: NEGATIVE mg/dL
Nitrite: NEGATIVE
Protein, ur: 30 mg/dL — AB
SPECIFIC GRAVITY, URINE: 1.009 (ref 1.005–1.030)
UROBILINOGEN UA: 1 mg/dL (ref 0.0–1.0)
pH: 6.5 (ref 5.0–8.0)

## 2014-06-26 LAB — URINE MICROSCOPIC-ADD ON

## 2014-06-26 MED ORDER — POTASSIUM CHLORIDE CRYS ER 20 MEQ PO TBCR: 20.0000 meq | EXTENDED_RELEASE_TABLET | Freq: Every day | ORAL | Status: DC

## 2014-06-26 MED ORDER — POTASSIUM CHLORIDE CRYS ER 20 MEQ PO TBCR
40.0000 meq | EXTENDED_RELEASE_TABLET | Freq: Once | ORAL | Status: AC
  Administered 2014-06-26: 40 meq via ORAL
  Filled 2014-06-26: qty 2

## 2014-06-26 MED ORDER — METHYLPREDNISOLONE SODIUM SUCC 125 MG IJ SOLR
125.0000 mg | Freq: Once | INTRAMUSCULAR | Status: AC
  Administered 2014-06-26: 125 mg via INTRAVENOUS
  Filled 2014-06-26: qty 2

## 2014-06-26 MED ORDER — CEPHALEXIN 500 MG PO CAPS: 500.0000 mg | ORAL_CAPSULE | Freq: Two times a day (BID) | ORAL | Status: DC

## 2014-06-26 MED ORDER — ALBUTEROL SULFATE HFA 108 (90 BASE) MCG/ACT IN AERS
2.0000 | INHALATION_SPRAY | Freq: Once | RESPIRATORY_TRACT | Status: DC
  Filled 2014-06-26: qty 6.7

## 2014-06-26 MED ORDER — DEXTROSE 5 % IV SOLN
1.0000 g | Freq: Once | INTRAVENOUS | Status: DC
  Filled 2014-06-26: qty 10

## 2014-06-26 MED ORDER — IOHEXOL 350 MG/ML SOLN: 100.0000 mL | Freq: Once | INTRAVENOUS | Status: DC | PRN

## 2014-06-26 MED ORDER — ALBUTEROL SULFATE (2.5 MG/3ML) 0.083% IN NEBU
5.0000 mg | INHALATION_SOLUTION | Freq: Once | RESPIRATORY_TRACT | Status: AC
  Administered 2014-06-26: 5 mg via RESPIRATORY_TRACT
  Filled 2014-06-26: qty 6

## 2014-06-26 MED ORDER — POTASSIUM CHLORIDE 10 MEQ/100ML IV SOLN
10.0000 meq | Freq: Once | INTRAVENOUS | Status: DC
  Filled 2014-06-26: qty 100
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
## 2014-06-26 NOTE — Discharge Instructions (Signed)
You have chosen to leave against medical advice.  If you change your mind, please return to the ER at any time for further evaluation.  Please follow up with your primary care provider as soon as possible.  You have an abnormal finding on your chest xray that requires further study. If you develop fevers, chest pain, shortness of breath, or worsening cough, please call 911 and return to the ER immediately for a recheck.      Hypokalemia Hypokalemia means that the amount of potassium in the blood is lower than normal.Potassium is a chemical, called an electrolyte, that helps regulate the amount of fluid in the body. It also stimulates muscle contraction and helps nerves function properly.Most of the body's potassium is inside of cells, and only a very small amount is in the blood. Because the amount in the blood is so small, minor changes can be life-threatening. CAUSES  Antibiotics.  Diarrhea or vomiting.  Using laxatives too much, which can cause diarrhea.  Chronic kidney disease.  Water pills (diuretics).  Eating disorders (bulimia).  Low magnesium level.  Sweating a lot. SIGNS AND SYMPTOMS  Weakness.  Constipation.  Fatigue.  Muscle cramps.  Mental confusion.  Skipped heartbeats or irregular heartbeat (palpitations).  Tingling or numbness. DIAGNOSIS  Your health care provider can diagnose hypokalemia with blood tests. In addition to checking your potassium level, your health care provider may also check other lab tests. TREATMENT Hypokalemia can be treated with potassium supplements taken by mouth or adjustments in your current medicines. If your potassium level is very low, you may need to get potassium through a vein (IV) and be monitored in the hospital. A diet high in potassium is also helpful. Foods high in potassium are:  Nuts, such as peanuts and pistachios.  Seeds, such as sunflower seeds and pumpkin seeds.  Peas, lentils, and lima beans.  Whole grain and  bran cereals and breads.  Fresh fruit and vegetables, such as apricots, avocado, bananas, cantaloupe, kiwi, oranges, tomatoes, asparagus, and potatoes.  Orange and tomato juices.  Red meats.  Fruit yogurt. HOME CARE INSTRUCTIONS  Take all medicines as prescribed by your health care provider.  Maintain a healthy diet by including nutritious food, such as fruits, vegetables, nuts, whole grains, and lean meats.  If you are taking a laxative, be sure to follow the directions on the label. SEEK MEDICAL CARE IF:  Your weakness gets worse.  You feel your heart pounding or racing.  You are vomiting or having diarrhea.  You are diabetic and having trouble keeping your blood glucose in the normal range. SEEK IMMEDIATE MEDICAL CARE IF:  You have chest pain, shortness of breath, or dizziness.  You are vomiting or having diarrhea for more than 2 days.  You faint. MAKE SURE YOU:   Understand these instructions.  Will watch your condition.  Will get help right away if you are not doing well or get worse. Document Released: 09/04/2005 Document Revised: 06/25/2013 Document Reviewed: 03/07/2013 [facility] Patient Information 2015 [place], [place]. This information is not intended to replace advice given to you by your health care provider. Make sure you discuss any questions you have with your health care provider.  Long QT Syndrome Long QT syndrome is a disorder of the heart's electrical system. Long QT syndrome affects the process that allows the heart to recharge itself after each heartbeat (repolarization). In long QT syndrome, the heart takes longer to recharge, which can lead to:  A very fast heart rhythm (arrhythmia).  Fainting (syncope).  Sudden death. Long QT syndrome can be either acquired or present at birth (congenital). Congenital long QT syndrome is either associated with deafness at birth (Jervell and Lang-Nielsen syndrome), which is rare, or not associated with deafness  (Romano-Ward syndrome), which is the most common type. RISK FACTORS  Deafness at birth.  Family history of experienced unexplained fainting or sudden cardiac death.  Use of certain medicines. CAUSES  Acquired long QT syndrome can be caused by abnormal electrolyte levels, such as low potassium levels and low magnesium levels. It can also be caused by the use of certain medicines. These medicines can include:  Antihistamines.  Antidepressants and psychotropic drugs.  Antiarrhythmics.  Antibiotics, antifungals, and antivirals.  Gastrointestinal medicines.  Diuretics.  High blood pressure medicines.  Cholesterol-lowering medicines.  Migraine medicines. DIAGNOSIS  Different kinds of tests can be used to diagnose long QT syndrome. These include:  Electrocardiography, which records the heart's electrical activity.  Holter monitor, which records your heartbeat and can help diagnose heart arrhythmias.  Stress tests by exercise or by giving medicine that makes the heart beat faster.  Genetic tests. TREATMENT  Treatment of long QT syndrome may involve:  Stopping the use of medicines that may be the cause.  Correcting abnormal electrolyte levels.  Correcting abnormal thyroid levels.  Use of heart medicines such as beta blockers.  An implantable cardioverter-defibrillator. This is a device that can shock a fast heart rate into a normal heart rhythm. SEEK IMMEDIATE MEDICAL CARE IF:  You have chest pain that feels like squeezing or pressure.  You feel faint or like you are going to pass out.  You feel your heart racing or skipping beats.  You have shortness of breath. MAKE SURE YOU:   Understand these instructions.  Will watch your condition.  Will get help right away if you are not doing well or get worse. Document Released: 07/02/2009 Document Revised: 11/27/2011 Document Reviewed: 07/02/2009 [facility] Patient Information 2015 [place], [place]. This information is  not intended to replace advice given to you by your health care provider. Make sure you discuss any questions you have with your health care provider.  Urinary Tract Infection A urinary tract infection (UTI) can occur any place along the urinary tract. The tract includes the kidneys, ureters, bladder, and urethra. A type of germ called bacteria often causes a UTI. UTIs are often helped with antibiotic medicine.  HOME CARE   If given, take antibiotics as told by your doctor. Finish them even if you start to feel better.  Drink enough fluids to keep your pee (urine) clear or pale yellow.  Avoid tea, drinks with caffeine, and bubbly (carbonated) drinks.  Pee often. Avoid holding your pee in for a long time.  Pee before and after having sex (intercourse).  Wipe from front to back after you poop (bowel movement) if you are a woman. Use each tissue only once. GET HELP RIGHT AWAY IF:   You have back pain.  You have lower belly (abdominal) pain.  You have chills.  You feel sick to your stomach (nauseous).  You throw up (vomit).  Your burning or discomfort with peeing does not go away.  You have a fever.  Your symptoms are not better in 3 days. MAKE SURE YOU:   Understand these instructions.  Will watch your condition.  Will get help right away if you are not doing well or get worse. Document Released: 02/21/2008 Document Revised: 05/29/2012 Document Reviewed: 04/04/2012 [employer] Patient Information 2015 [place]. This information is not intended to replace advice given to you by your health care provider. Make sure you discuss any questions you have with your health care provider.
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
## 2014-06-26 NOTE — Progress Notes (Signed)
06/26/2014 A. Javonne Dorko RNCM 1829pm EDCM saw patient wanted to leave AMA.  EDCM spoke to patient at bedside.  Patient reports, "I want to go home!"  [facility] does not have permission by patient to call patient's daughter but was given permission to speak to [facility].  EDCM called Theophilus Kinds who reports she will be coming to pick patient up and try to get her to stay in the hospital.  Discussed patient with EDPA and EDRN.  No further EDCM needs at this time

## 2014-06-26 NOTE — ED Notes (Signed)
Social work at bedside.  

## 2014-06-26 NOTE — Progress Notes (Signed)
06/26/2014 A. Avabella Wailes RNCM 1957pm EDCM spoke with patient, Theophilus Kinds and EDPA at bedside.  Patient's caretaker Theophilus Kinds unable to convince patient to stay in the hospital for treatment.  Received patient's permission to inform Theophilus Kinds of patient's health issues.  [facility] asked patient if she was not to be admitted to the hospital, would it be okay to arrange home health for her?  Patient said, "Yes, you can do that."  Also received patient's permission to inform pcp of current health issues.  [facility] chosen for home health services.  Patient is currently active with [facility].  EDP placed orders for patient for home health RN, PT, OT, aide and social worker.  Patient leaving AMA.  No further EDCM needs at this time.

## 2014-06-26 NOTE — Progress Notes (Signed)
06/26/2014 a. Vanessa Calderon [facility] 2111pm [facility] faxed home health orders to [facility] at 2020pm with confirmation of receipt at 2023pm.  APS report filed by Education officer, museum, email sent to patient's pcp to update her on her condition.  No further EDCM needs at this time

## 2014-06-26 NOTE — ED Notes (Addendum)
Pt wants to leave AMA. Pt spoke with PA who explained risks of leaving. Pt verbalized risks of leaving and still wants to leave AMA. Pt refuse IV medications

## 2014-06-26 NOTE — ED Provider Notes (Signed)
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 Cough     (Consider location/radiation/quality/duration/timing/severity/associated sxs/prior Treatment) The history is provided by the patient and a caregiver.    Patient states she is here because her electricity has been out for one week because she did not pay her bill.  States she came to the ED because the apartment manager called 911 - she does not remember how she got here.  She denies calling anyone for help.  Family lives in [place].  Plans to pay her bill when she gets her check on Oct 24.   States she is feeling physically well.  Is eating and drinking well.  Denies any recent illness, any pain, difficulty breathing, N/V/D.    Past Medical History  Diagnosis Date  . CORONARY ARTERY DISEASE 05/15/2008    CABG 2006, DES to the saphenous vein graft to the ramus in December of 2006.  ECHO 2010 : basal inferior wall akinesis. Post-op septal wall dyssynchrony. Grade 1diastolic dysfunction.       [name] DIABETES MELLITUS, TYPE II 05/15/2008    Well controlled on oral meds.    [name] HYPERLIPIDEMIA 05/11/2008    Mgmt with statin    . HYPERTENSION 05/15/2008    Needs BB 2/2 CAD and ACEI 2/2 DM.     [name] Overweight (BMI 25.0-29.9) 03/27/2012  . Tobacco use 03/27/2012    She has tried Chantix in past with intolerable side effects.   [name] CVA 09/23/2009    Left CEA May 2006 at same time as CABG 2010 : Acute left hemisphere infarct of posterior limb internal capsule and lateral thalamus.  Prior CVA's but dates unknown, imaging shows widespread multifocal remote lacunar infarcts throughout the brain and brainstem.  MRA 2010 : no severe proximal flow limiting stenosis.    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 Coronary artery bypass graft  May 2006     Dr Servando Snare  . Abdominal hysterectomy    . Cholecystectomy    . Back surgery      lumbar L3-L5  . Exploratory laparotomy  7/03    Removed the cervix and now has vaginal cuff  . Carotid endarterectomy  May 2006    Left - at same time as CABG. Dr Tenna Delaine.  . Angioplasty / stenting femoral  11/03/2010    B aorto-fem angiogram with artherectomy and stenting of the R SFA 2/2 claudication.  . Cystoscopy  01/2013    Normal. Dr Matilde Sprang   No family history on file. History  Substance Use Topics  . Smoking status: Current Every Day Smoker -- 0.50 packs/day    Types: Cigarettes  . Smokeless tobacco: Current User     Comment: trying to cut back.  . Alcohol Use: No   OB History   Grav Para Term Preterm Abortions TAB SAB Ect Mult Living                 Review of Systems  All other systems reviewed and are negative.     Allergies  Chantix; Codeine; Codeine phosphate; and Penicillins  Home Medications   Prior to Admission medications   Medication Sig Start Date End Date Taking? Authorizing Provider  aspirin 325 MG tablet Take 325 mg  by mouth daily.   Yes Historical Provider, MD  fesoterodine (TOVIAZ) 8 MG TB24 tablet Take 1 tablet (8 mg total) by mouth daily. 06/12/13  Yes Bartholomew Crews, MD  lisinopril (PRINIVIL,ZESTRIL) 10 MG tablet Take 1 tablet (10 mg total) by mouth daily. 05/06/14  Yes Bartholomew Crews, MD  metFORMIN (GLUCOPHAGE) 500 MG tablet Take 1 tablet (500 mg total) by mouth 2 (two) times daily with a meal. 06/19/14  Yes Otho Bellows, MD  metoprolol succinate (TOPROL XL) 25 MG 24 hr tablet Take 1 tablet (25 mg total) by mouth daily. 05/21/14 05/21/15 Yes Bartholomew Crews, MD  rosuvastatin (CRESTOR) 10 MG tablet Take 10 mg by mouth every evening.   Yes Historical Provider, MD  ibuprofen (ADVIL,MOTRIN) 800 MG tablet Take 800 mg by mouth every 8 (eight) hours as needed for mild pain.    Historical Provider, MD   BP 138/93  Pulse 93  Temp(Src) 98.3 F (36.8 C)  (Oral)  Resp 16  SpO2 97% Physical Exam  Nursing note and vitals reviewed. Constitutional: She is oriented to person, place, and time. She appears well-developed and well-nourished. No distress.  HENT:  Head: Normocephalic and atraumatic.  Neck: Neck supple.  Cardiovascular: Normal rate and regular rhythm.   Pulmonary/Chest: Effort normal. No accessory muscle usage. No respiratory distress. She has decreased breath sounds. She has no wheezes. She has no rales.  cough  Abdominal: Soft. She exhibits no distension. There is no tenderness. There is no rebound and no guarding.  Neurological: She is alert and oriented to person, place, and time.  Skin: She is not diaphoretic.  Psychiatric: Her speech is normal. She is slowed. She exhibits abnormal recent memory.    ED Course  Procedures (including critical care time) Labs Review Labs Reviewed  CBC WITH DIFFERENTIAL - Abnormal; Notable for the following:    WBC 17.6 (*)    Neutro Abs 13.1 (*)    Monocytes Absolute 1.2 (*)    All other components within normal limits  BASIC METABOLIC PANEL - Abnormal; Notable for the following:    Potassium 2.7 (*)    Glucose, Bld 220 (*)    Creatinine, Ser 1.12 (*)    GFR calc non Af Amer 50 (*)    GFR calc Af Amer 58 (*)    All other components within normal limits  URINALYSIS, ROUTINE W REFLEX MICROSCOPIC - Abnormal; Notable for the following:    APPearance CLOUDY (*)    Hgb urine dipstick TRACE (*)    Protein, ur 30 (*)    Leukocytes, UA MODERATE (*)    All other components within normal limits  URINE MICROSCOPIC-ADD ON - Abnormal; Notable for the following:    Bacteria, UA MANY (*)    All other components within normal limits  I-STAT TROPOININ, ED    Imaging Review Dg Chest 2 View  06/26/2014   CLINICAL DATA:  Cough and congestion for 2 weeks, low oxygen saturation, personal history coronary artery disease, diabetes mellitus type 2, hypertension, hyperlipidemia, smoking  EXAM: CHEST  2  VIEW  COMPARISON:  03/16/2012  FINDINGS: Mild enlargement of cardiac silhouette post CABG.  Pulmonary vascularity normal.  New enlargement of RIGHT hilum and suprahilar/infrahilar regions, cannot exclude mass or adenopathy.  Mild atelectasis or infiltrate in RIGHT middle lobe.  Remaining lungs clear.  No pleural effusion or pneumothorax.  Osseous structures unremarkable.  IMPRESSION: New RIGHT hilar and perihilar opacities concerning for adenopathy or mass.  CT chest with contrast  recommended to exclude mass and adenopathy.  Mild atelectasis versus infiltrate in RIGHT middle lobe.   Electronically Signed   By: Lavonia Dana M.D.   On: 06/26/2014 16:17     EKG Interpretation   Date/Time:  Friday June 26 2014 17:03:20 EDT Ventricular Rate:  90 PR Interval:  156 QRS Duration: 98 QT Interval:  431 QTC Calculation: 527 R Axis:   3 Text Interpretation:  Sinus rhythm Probable left atrial enlargement  Abnormal inferior Q waves Borderline repolarization abnormality Prolonged  QT interval Baseline wander in lead(s) V3 prolonged QT new, likely rate  related  Confirmed by YAO  MD, DAVID ([id number]) on 06/26/2014 6:18:36 PM      4:13 PM Discussed pt with Dr Darl Householder.   Per Education officer, museum - "does what she wants to do"  "kind of with it, kind of not" at baseline.  Stated to be stubborn, in general, does not always take her medications.  Also notes that she has been completely at her baseline mentally and there have been no acute changes or concerns.   Theophilus Kinds - can pick up after 6:00.  She will put in APS report, provide resources.   6:15 PM Pt requesting/demanding to go home.  I have advised patient that she has a UTI and needs antibiotics, that she is hypokalemic and needs potassium (and discussed with her why this is important).  I have also informed her that her CXR is abnormal and the importance of obtaining further information about this as the range of possibilities includes deadly conditions such as cancer.   We have discussed that leaving AMA can lead to permanent disability or death.  She verbalizes understanding.  She is able to repeat back to me exactly what my concerns are and the diagnoses, she is able to tell me her primary care provider that she can follow up with.  I have offered her many things in order to make her stay more comfortable including blankets, pain medications, breathing treatments, tv, food, drinks - she declines.  States she wants to go home.  I have asked if there is anything specific she is concerned about or needs from [place] that we are not providing.  She states that there is not and she just wants to go home.  I have asked if she will wait until Theophilus Kinds comes to sit with her and potentially take her home and she states that she will not, that she will take a cab home.   7:42 PM Theophilus Kinds has come to patient's bedside.  With patient's consent, we discussed our findings and concerns again with the patient and with [facility].  Pt declines any further testing.  She declines allowing [place] to speak with her daughter but has allowed Amy, social work, to set up home health and contact her primary care provider.    MDM   Final diagnoses:  Lung mass  UTI (lower urinary tract infection)  Hypokalemia  Prolonged Q-T interval on ECG   Patient brought in by EMS, it seems because patient's electricity was cut off due to not paying bill.  Patient has had cough and was found to be hypoxic - chest xray abnormal, found to have mass.  With hypoxia and possible mass, ordered CT angio chest.  Discussed with Dr Darl Householder.  Labs, UA ordered - pt found to have UTI and hypokalemia.  Per caregiver her mental status is baseline and unchanged.  No other concerns.  Please see discussion above about patient's decision to leave  AMA.  Left AMA with prescriptions for keflex and potassium.     Clayton Bibles, PA-C 06/26/14 2346

## 2014-06-27 NOTE — ED Provider Notes (Signed)
Medical screening examination/treatment/procedure(s) were conducted as a shared visit with non-physician practitioner(s) and myself.  I personally evaluated the patient during the encounter.   EKG Interpretation   Date/Time:  Friday June 26 2014 17:03:20 EDT Ventricular Rate:  90 PR Interval:  156 QRS Duration: 98 QT Interval:  431 QTC Calculation: 527 R Axis:   3 Text Interpretation:  Sinus rhythm Probable left atrial enlargement  Abnormal inferior Q waves Borderline repolarization abnormality Prolonged  QT interval Baseline wander in lead(s) V3 prolonged QT new, likely rate  related  Confirmed by Dena Esperanza  MD, Alonnah Lampkins ([id number]) on 06/26/2014 6:18:36 PM      Vanessa Calderon is a 68 y.o. female smoker, CAD here by EMS. The Secondary school teacher called EMS because she didn't pay her electric bill. She has no complaints. Has been still smoking. She said that her oxygen always read low but she is not using oxygen. On initial exam, hypoxic to 89%. Minimal diffuse wheezing on exam. Heart exam and abdomen unremarkable. I am concerned for possible COPD vs PE. CXR showed possible lung mass. CT angio chest ordered. K 2.7. WBC 17, + UTI. Also prolonged QT on EKG. However, patient wanted to leave against medical advice and refused CT or further testing or observation. Understand risks and patient alert and oriented.    Wandra Arthurs, MD 06/27/14 2250

## 2014-06-30 ENCOUNTER — Telehealth: Payer: Self-pay | Admitting: *Deleted

## 2014-06-30 NOTE — Telephone Encounter (Signed)
[facility] is also working to contact pt. She will need to be seen prior to me signing home health orders bc it is not clear to me whether she needs assistance with ADL's. There is no question that she needs help with meds and medical care though.

## 2014-06-30 NOTE — Telephone Encounter (Signed)
Vanessa Calderon with [facility] [phone] called about referral from [facility] for home health - unable to reach pt. [facility] called pt at [phone] - left message on home phone ID recording about [facility] trying to get in touch with pt. Vanessa Calderon states she will leave open for few more days in case pt calls [facility]. [name] RN 06/30/14 10:25AM

## 2014-06-30 NOTE — Telephone Encounter (Signed)
Note from Dr Lynnae January 06/30/14 read to Lavella Lemons at [facility]

## 2014-07-01 ENCOUNTER — Telehealth: Payer: Self-pay | Admitting: *Deleted

## 2014-07-01 NOTE — Telephone Encounter (Signed)
Call from [facility] with [facility] - # [phone] Pt was brought to ED by EMS on 10/9, examined but left AMA.  Pt's potassium was 2.7, UTI and ? Lung mass. Referral to [facility] done but pt has [employer] so they can not see this patient. He states these is no ACO that covers [place].  APS to visit pt today because pt had feces on her on arrival.   Police were called PTA because electric bill was not paid. Dept of social services were called and Juliane Lack will talk with Golden Hurter tomorrow to help with this patient.   Juliane Lack would like to talk with you.

## 2014-07-02 NOTE — Telephone Encounter (Signed)
CSW spoke with Delmar Landau, [facility].  [facility] has potentially deferred financial services due to [facility] being pt's secondary insurance.  [employer] PPO states they are primary.  However, [facility] SW is currently APS contact.

## 2014-07-02 NOTE — Telephone Encounter (Signed)
CSW confirmed pt is current with [employer] will notify Dorann Ou, ACO is contracted with [facility] HMO Gold Plus.  Referral to home health/[facility] was initially order by Dr. Eulas Post on 06/22/14 and sent to [facility] pending insurance authorization.

## 2014-07-03 ENCOUNTER — Other Ambulatory Visit: Payer: Self-pay | Admitting: Internal Medicine

## 2014-07-03 ENCOUNTER — Telehealth: Payer: Self-pay | Admitting: Licensed Clinical Social Worker

## 2014-07-03 DIAGNOSIS — R9389 Abnormal findings on diagnostic imaging of other specified body structures: Secondary | ICD-10-CM

## 2014-07-03 NOTE — Telephone Encounter (Signed)
CSW placed call to [place] Worker.  [facility] SW states follow up with pt following her ED visit.  [facility]N states pt has been without power for 2 weeks/since promotion of new apartment ended.  Pt unable to afford the $200 deposit for [employer] connection.  [facility] made home visit in which pt had urine soaked furniture.  Apartment Press photographer states residents have reported Vanessa Calderon roaming around parking lot at night in her nightgown attempting to get into cars and crossing [employer].  APS was contacted after pt presented to ED with feces on her.  At this time, [facility] SW is contact for APS worker.

## 2014-07-09 ENCOUNTER — Ambulatory Visit (HOSPITAL_COMMUNITY)
Admission: RE | Admit: 2014-07-09 | Discharge: 2014-07-09 | Disposition: A | Discharge status: Home / Self Care | Payer: Medicare HMO | Source: Ambulatory Visit | Attending: Internal Medicine | Admitting: Internal Medicine

## 2014-07-09 ENCOUNTER — Ambulatory Visit (INDEPENDENT_AMBULATORY_CARE_PROVIDER_SITE_OTHER): Payer: BC Managed Care – PPO | Admitting: Internal Medicine

## 2014-07-09 ENCOUNTER — Encounter: Payer: Self-pay | Admitting: Licensed Clinical Social Worker

## 2014-07-09 ENCOUNTER — Encounter: Payer: Self-pay | Admitting: Internal Medicine

## 2014-07-09 VITALS — BP 133/66 | HR 97 | Temp 98.1°F | Resp 20 | Ht 63.5 in | Wt 171.8 lb

## 2014-07-09 DIAGNOSIS — Z0000 Encounter for general adult medical examination without abnormal findings: Secondary | ICD-10-CM

## 2014-07-09 DIAGNOSIS — Z23 Encounter for immunization: Secondary | ICD-10-CM

## 2014-07-09 DIAGNOSIS — E876 Hypokalemia: Secondary | ICD-10-CM

## 2014-07-09 DIAGNOSIS — R599 Enlarged lymph nodes, unspecified: Secondary | ICD-10-CM | POA: Insufficient documentation

## 2014-07-09 DIAGNOSIS — I2510 Atherosclerotic heart disease of native coronary artery without angina pectoris: Secondary | ICD-10-CM | POA: Insufficient documentation

## 2014-07-09 DIAGNOSIS — E119 Type 2 diabetes mellitus without complications: Secondary | ICD-10-CM

## 2014-07-09 DIAGNOSIS — R918 Other nonspecific abnormal finding of lung field: Secondary | ICD-10-CM | POA: Insufficient documentation

## 2014-07-09 DIAGNOSIS — Z720 Tobacco use: Secondary | ICD-10-CM | POA: Diagnosis not present

## 2014-07-09 DIAGNOSIS — I10 Essential (primary) hypertension: Secondary | ICD-10-CM

## 2014-07-09 DIAGNOSIS — E278 Other specified disorders of adrenal gland: Secondary | ICD-10-CM | POA: Diagnosis not present

## 2014-07-09 DIAGNOSIS — R9389 Abnormal findings on diagnostic imaging of other specified body structures: Secondary | ICD-10-CM

## 2014-07-09 DIAGNOSIS — Z742 Need for assistance at home and no other household member able to render care: Secondary | ICD-10-CM

## 2014-07-09 DIAGNOSIS — E669 Obesity, unspecified: Secondary | ICD-10-CM

## 2014-07-09 DIAGNOSIS — E1169 Type 2 diabetes mellitus with other specified complication: Secondary | ICD-10-CM

## 2014-07-09 LAB — CBC WITH DIFFERENTIAL/PLATELET
Basophils Absolute: 0 10*3/uL (ref 0.0–0.1)
Basophils Relative: 0 % (ref 0–1)
Eosinophils Absolute: 0 10*3/uL (ref 0.0–0.7)
Eosinophils Relative: 0 % (ref 0–5)
HCT: 34.2 % — ABNORMAL LOW (ref 36.0–46.0)
HEMOGLOBIN: 11.3 g/dL — AB (ref 12.0–15.0)
LYMPHS ABS: 2.6 10*3/uL (ref 0.7–4.0)
Lymphocytes Relative: 26 % (ref 12–46)
MCH: 30.8 pg (ref 26.0–34.0)
MCHC: 33 g/dL (ref 30.0–36.0)
MCV: 93.2 fL (ref 78.0–100.0)
MONOS PCT: 5 % (ref 3–12)
Monocytes Absolute: 0.5 10*3/uL (ref 0.1–1.0)
NEUTROS PCT: 69 % (ref 43–77)
Neutro Abs: 6.8 10*3/uL (ref 1.7–7.7)
PLATELETS: 388 10*3/uL (ref 150–400)
RBC: 3.67 MIL/uL — ABNORMAL LOW (ref 3.87–5.11)
RDW: 12.6 % (ref 11.5–15.5)
WBC: 9.9 10*3/uL (ref 4.0–10.5)

## 2014-07-09 LAB — BASIC METABOLIC PANEL
BUN: 9 mg/dL (ref 6–23)
CO2: 29 mEq/L (ref 19–32)
CREATININE: 0.92 mg/dL (ref 0.50–1.10)
Calcium: 9.4 mg/dL (ref 8.4–10.5)
Chloride: 102 mEq/L (ref 96–112)
Glucose, Bld: 352 mg/dL — ABNORMAL HIGH (ref 70–99)
Potassium: 3.9 mEq/L (ref 3.5–5.3)
Sodium: 143 mEq/L (ref 135–145)

## 2014-07-09 LAB — GLUCOSE, CAPILLARY: GLUCOSE-CAPILLARY: 296 mg/dL — AB (ref 70–99)

## 2014-07-09 MED ORDER — IOHEXOL 300 MG/ML  SOLN
80.0000 mL | Freq: Once | INTRAMUSCULAR | Status: AC | PRN
  Administered 2014-07-09: 80 mL via INTRAVENOUS

## 2014-07-09 NOTE — Patient Instructions (Signed)
General Instructions:  Please bring your medicines with you each time you come to clinic.  Medicines may include prescription medications, over-the-counter medications, herbal remedies, eye drops, vitamins, or other pills.  We will call you with your next appointments for further work up of this lung mass.    Please consider stopping smoking  Keep in touch with dr. Lynnae January and your social workers  Please keep checking your blood sugar and bring your medications to your visit  Progress Toward Treatment Goals:  Treatment Goal 10/21/2013  Hemoglobin A1C at goal  Blood pressure unchanged  Stop smoking -  Prevent falls -    Self Care Goals & Plans:  Self Care Goal 07/09/2014  Manage my medications take my medicines as prescribed; bring my medications to every visit  Monitor my health keep track of my blood pressure  Eat healthy foods -  Be physically active -  Stop smoking -    Home Blood Glucose Monitoring 10/21/2013  Check my blood sugar once a day  When to check my blood sugar before breakfast     Care Management & Community Referrals:  Referral 10/21/2013  Referrals made for care management support [facility] case management program  Referrals made to community resources -

## 2014-07-09 NOTE — Progress Notes (Signed)
CSW met with Vanessa Calderon and friend/caregiver during her scheduled [facility] appointment.  Pt is aware APS is involved and states "no one is placing me anywhere" "I am moving to [place]".  Caregiver/friend confirms pt's daughter is planning to move Ms. Gelber to [place] this weekend.  Friend/Caregiver states they are going to move whatever in the home that is salvageable.  Pt states she is in agreement and looking forward to the move. CSW attempted to contact daughter at phone number on chart, not a valid number.

## 2014-07-09 NOTE — Progress Notes (Signed)
Obtained updated phone number for [place], [phone], chart updated.  CSW placed call to daughter left message offering assistance with continuity of care.

## 2014-07-10 ENCOUNTER — Telehealth: Payer: Self-pay | Admitting: Licensed Clinical Social Worker

## 2014-07-10 NOTE — Telephone Encounter (Signed)
Ms. Caley daughther, Macie Burows, returned call to [place].  Daughters states her plan is to travel to [place] on Saturday 07/11/14 and transport Ms. Verner back to [place] to live with her.  CSW provided daughter with the phone number and fax number to [facility] for daughter to provide to pt's new PCP once established.  Daughter requesting to speak with PCP, CSW informed Ms. Rivers PCP is aware and is attempting to contact patient.  Daughter provided with name of PCP and encouraged to call [facility] back after lunch hours to main number.  Daughter aware and denies add'l needs at this time.
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
## 2014-07-12 ENCOUNTER — Encounter: Payer: Self-pay | Admitting: Internal Medicine

## 2014-07-12 DIAGNOSIS — R918 Other nonspecific abnormal finding of lung field: Secondary | ICD-10-CM | POA: Insufficient documentation

## 2014-07-12 NOTE — Assessment & Plan Note (Addendum)
Flu vaccine today  Needs colonoscopy Foot exam next visit May need prevnar next visit

## 2014-07-12 NOTE — Assessment & Plan Note (Addendum)
BP Readings from Last 3 Encounters:  07/09/14 133/66  06/26/14 122/65  06/19/14 153/67   Lab Results  Component Value Date   NA 143 07/09/2014   K 3.9 07/09/2014   CREATININE 0.92 07/09/2014   Assessment: Blood pressure control:  controlled Progress toward BP goal:   at goal Comments: able to list off her medications but could not initially identify metoprolol but said she is on two medications for blood pressure. She takes her own medications.   Plan: Medications:  continue current medications lisinopril 10mg  and toprol xl 25mg  daily Unclear if she is actually taking her metoprolol (HR 97) but she does say she takes 2 medications for blood pressure but definitely knows that she takes lisinopril.

## 2014-07-12 NOTE — Assessment & Plan Note (Addendum)
Likely lung cancer in setting of smoking. We reviewed the scans in detail today and the report. Next steps would be biopsy and pet scanning which she wishes to proceed with as outpatient.   -I have discussed the scans with Dr. Elsworth Soho from pulmonary who recommends IR guided biopsy at this time and if negative will proceed with bronchoscopy /EBUS & needle aspirate subcarinal Lns.  -while she did not want me to call her daughter with the results, she did give permission to review results with daughter if she calls the clinic -recommended her to try to quit smoking  Of note, since my visit with Ms. Harrow I see an updated CSW phone note claming the daughter called and plans to arrive in [place] on Saturday and return to [place] with her mother. It appears PCP was trying to call her back but unclear if they were able to speak yet. I will thus, hold on placing biopsy consult until we can confirm where Ms. Bither is. It will be essential to keep track of her and her new PCP so that she can get further work up as soon as possible.  -cbc and bmet today (leukocytosis has resolved, Hb down to 11.3, and bmet also much improved)

## 2014-07-12 NOTE — Assessment & Plan Note (Signed)
Lab Results  Component Value Date   HGBA1C 7.6 05/21/2014   HGBA1C 7.6 02/11/2014   HGBA1C 6.4 10/21/2013    Assessment: Diabetes control:  stable at 7.6 a1c Comments: did not bring meter or medications with her today  Plan: Medications:  continue current medications metformin 500mg  bid. cbg 296 today. Home glucose monitoring: Frequency:   Timing:   Instruction/counseling given: reminded to bring blood glucose meter & log to each visit and reminded to bring medications to each visit Educational resources provided:   Self management tools provided:   Other plans:

## 2014-07-12 NOTE — Assessment & Plan Note (Signed)
Recheck bmet today. If still severely low, will admit to inpatient service  bmet with K 3.9 (improved from 2.7 in ED)

## 2014-07-12 NOTE — Assessment & Plan Note (Signed)
  Assessment: Comments: still smoking, has trouble affording but borrows from neighbors and friends   Plan: Instruction/counseling given:  I counseled patient on the dangers of tobacco use, advised patient to stop smoking, and reviewed strategies to maximize success. Educational resources provided:    Self management tools provided:    Medications to assist with smoking cessation:  None Patient agreed to the following self-care plans for smoking cessation:    Other plans: has apparently tried chantix in the past. Does not appear to have intention to quit at this time but likely has new dx of lung cancer so this may change
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
## 2014-07-13 ENCOUNTER — Telehealth: Payer: Self-pay | Admitting: *Deleted

## 2014-07-13 NOTE — Telephone Encounter (Signed)
Called pt # and line rang busy x 4 Called daughter Rod Holler and [facility] x1  Phone note in [facility] 07/10/14 states pt was leaving for [place] on Saturday to live with daughter. Will call back

## 2014-07-13 NOTE — Progress Notes (Signed)
Internal Medicine Clinic Attending  Case discussed with Dr. Eula Fried soon after the resident saw the patient.  We reviewed the resident's history and exam and pertinent patient test results.  I agree with the assessment, diagnosis, and plan of care documented in the resident's note. I spoke with the daughter who was in [place] and intending to return to [place] on Wed 28th. Not enough time to arrange bx here and would prefer w/u to be done all at one center. Daughter understands to take mother to [place] ASAP on arrival in [place]. I printed off records and got CD of CT scan for daughter to pick up.

## 2014-07-13 NOTE — Telephone Encounter (Signed)
Message copied by Inge Rise on Mon Jul 13, 2014  8:54 AM ------      Message from: Rigoberto Noel      Created: Sat Jul 11, 2014  3:53 PM      Regarding: FW: pulm referral, new mass, need help       Pl double book OV first available      ----- Message -----         From: Wilber Oliphant, MD         Sent: 07/11/2014   1:18 PM           To: Rigoberto Noel, MD      Subject: pulm referral, new mass, need help                       Dear Dr. Elsworth Soho,            I recently saw [name] in [facility] for ED follow up visit. She is a 68 year old smoker female found to have on CT chest with contrast            Irregular-shaped infiltrative mass in the central aspect of the      right middle lobe measuring approximately 4.3 x 4.3 x 2.0 cm, with      extensive right hilar, mediastinal and right supraclavicular      Lymphadenopathy             Would you mind please taking a look at the scan and letting me know how to best set up a biopsy for her as the next step?             Thank you!            Samaya       ------

## 2014-07-17 ENCOUNTER — Emergency Department (HOSPITAL_COMMUNITY): Payer: BC Managed Care – PPO

## 2014-07-17 ENCOUNTER — Encounter (HOSPITAL_COMMUNITY): Payer: Self-pay | Admitting: Emergency Medicine

## 2014-07-17 ENCOUNTER — Inpatient Hospital Stay (HOSPITAL_COMMUNITY): Payer: BC Managed Care – PPO

## 2014-07-17 ENCOUNTER — Inpatient Hospital Stay (HOSPITAL_COMMUNITY)
Admission: EM | Admit: 2014-07-17 | Discharge: 2014-07-23 | DRG: 055 | Disposition: A | Discharge status: Home / Self Care | Payer: BC Managed Care – PPO | Attending: Internal Medicine | Admitting: Internal Medicine

## 2014-07-17 DIAGNOSIS — R918 Other nonspecific abnormal finding of lung field: Secondary | ICD-10-CM

## 2014-07-17 DIAGNOSIS — E785 Hyperlipidemia, unspecified: Secondary | ICD-10-CM | POA: Diagnosis present

## 2014-07-17 DIAGNOSIS — F17210 Nicotine dependence, cigarettes, uncomplicated: Secondary | ICD-10-CM | POA: Diagnosis present

## 2014-07-17 DIAGNOSIS — Z951 Presence of aortocoronary bypass graft: Secondary | ICD-10-CM

## 2014-07-17 DIAGNOSIS — C7931 Secondary malignant neoplasm of brain: Secondary | ICD-10-CM | POA: Diagnosis present

## 2014-07-17 DIAGNOSIS — F329 Major depressive disorder, single episode, unspecified: Secondary | ICD-10-CM | POA: Insufficient documentation

## 2014-07-17 DIAGNOSIS — I2510 Atherosclerotic heart disease of native coronary artery without angina pectoris: Secondary | ICD-10-CM | POA: Diagnosis present

## 2014-07-17 DIAGNOSIS — F0630 Mood disorder due to known physiological condition, unspecified: Secondary | ICD-10-CM | POA: Insufficient documentation

## 2014-07-17 DIAGNOSIS — R410 Disorientation, unspecified: Secondary | ICD-10-CM | POA: Insufficient documentation

## 2014-07-17 DIAGNOSIS — R471 Dysarthria and anarthria: Secondary | ICD-10-CM | POA: Diagnosis present

## 2014-07-17 DIAGNOSIS — R591 Generalized enlarged lymph nodes: Secondary | ICD-10-CM | POA: Diagnosis present

## 2014-07-17 DIAGNOSIS — E669 Obesity, unspecified: Secondary | ICD-10-CM

## 2014-07-17 DIAGNOSIS — Z955 Presence of coronary angioplasty implant and graft: Secondary | ICD-10-CM | POA: Diagnosis not present

## 2014-07-17 DIAGNOSIS — C342 Malignant neoplasm of middle lobe, bronchus or lung: Secondary | ICD-10-CM | POA: Diagnosis present

## 2014-07-17 DIAGNOSIS — N319 Neuromuscular dysfunction of bladder, unspecified: Secondary | ICD-10-CM | POA: Diagnosis present

## 2014-07-17 DIAGNOSIS — E119 Type 2 diabetes mellitus without complications: Secondary | ICD-10-CM | POA: Diagnosis present

## 2014-07-17 DIAGNOSIS — R269 Unspecified abnormalities of gait and mobility: Secondary | ICD-10-CM | POA: Diagnosis present

## 2014-07-17 DIAGNOSIS — I69351 Hemiplegia and hemiparesis following cerebral infarction affecting right dominant side: Secondary | ICD-10-CM

## 2014-07-17 DIAGNOSIS — R296 Repeated falls: Secondary | ICD-10-CM | POA: Diagnosis present

## 2014-07-17 DIAGNOSIS — E1169 Type 2 diabetes mellitus with other specified complication: Secondary | ICD-10-CM

## 2014-07-17 DIAGNOSIS — I10 Essential (primary) hypertension: Secondary | ICD-10-CM | POA: Diagnosis present

## 2014-07-17 DIAGNOSIS — Z720 Tobacco use: Secondary | ICD-10-CM | POA: Diagnosis present

## 2014-07-17 DIAGNOSIS — F32A Depression, unspecified: Secondary | ICD-10-CM | POA: Insufficient documentation

## 2014-07-17 LAB — PROTIME-INR
INR: 1.18 (ref 0.00–1.49)
Prothrombin Time: 15.2 seconds (ref 11.6–15.2)

## 2014-07-17 LAB — DIFFERENTIAL
BASOS ABS: 0 10*3/uL (ref 0.0–0.1)
Basophils Relative: 0 % (ref 0–1)
EOS PCT: 0 % (ref 0–5)
Eosinophils Absolute: 0 10*3/uL (ref 0.0–0.7)
LYMPHS PCT: 24 % (ref 12–46)
Lymphs Abs: 2.6 10*3/uL (ref 0.7–4.0)
MONO ABS: 0.6 10*3/uL (ref 0.1–1.0)
MONOS PCT: 6 % (ref 3–12)
Neutro Abs: 7.6 10*3/uL (ref 1.7–7.7)
Neutrophils Relative %: 70 % (ref 43–77)

## 2014-07-17 LAB — COMPREHENSIVE METABOLIC PANEL
ALT: 17 U/L (ref 0–35)
AST: 19 U/L (ref 0–37)
Albumin: 3.3 g/dL — ABNORMAL LOW (ref 3.5–5.2)
Alkaline Phosphatase: 129 U/L — ABNORMAL HIGH (ref 39–117)
Anion gap: 13 (ref 5–15)
BUN: 11 mg/dL (ref 6–23)
CALCIUM: 9.5 mg/dL (ref 8.4–10.5)
CO2: 25 mEq/L (ref 19–32)
CREATININE: 0.92 mg/dL (ref 0.50–1.10)
Chloride: 101 mEq/L (ref 96–112)
GFR calc non Af Amer: 63 mL/min — ABNORMAL LOW (ref >90)
GFR, EST AFRICAN AMERICAN: 74 mL/min — AB (ref >90)
Glucose, Bld: 304 mg/dL — ABNORMAL HIGH (ref 70–99)
Potassium: 4.3 mEq/L (ref 3.7–5.3)
Sodium: 139 mEq/L (ref 137–147)
Total Bilirubin: 0.3 mg/dL (ref 0.3–1.2)
Total Protein: 7.7 g/dL (ref 6.0–8.3)

## 2014-07-17 LAB — I-STAT TROPONIN, ED: Troponin i, poc: 0.01 ng/mL (ref 0.00–0.08)

## 2014-07-17 LAB — CBC
HEMATOCRIT: 34.9 % — AB (ref 36.0–46.0)
HEMOGLOBIN: 11.5 g/dL — AB (ref 12.0–15.0)
MCH: 31.1 pg (ref 26.0–34.0)
MCHC: 33 g/dL (ref 30.0–36.0)
MCV: 94.3 fL (ref 78.0–100.0)
Platelets: 325 10*3/uL (ref 150–400)
RBC: 3.7 MIL/uL — ABNORMAL LOW (ref 3.87–5.11)
RDW: 13.4 % (ref 11.5–15.5)
WBC: 10.8 10*3/uL — ABNORMAL HIGH (ref 4.0–10.5)

## 2014-07-17 LAB — GLUCOSE, CAPILLARY: Glucose-Capillary: 245 mg/dL — ABNORMAL HIGH (ref 70–99)

## 2014-07-17 LAB — APTT: aPTT: 33 seconds (ref 24–37)

## 2014-07-17 MED ORDER — DEXAMETHASONE SODIUM PHOSPHATE 10 MG/ML IJ SOLN
4.0000 mg | Freq: Four times a day (QID) | INTRAMUSCULAR | Status: DC
  Administered 2014-07-17 – 2014-07-18 (×2): 4 mg via INTRAVENOUS
  Filled 2014-07-17 (×2): qty 1

## 2014-07-17 MED ORDER — INSULIN ASPART 100 UNIT/ML ~~LOC~~ SOLN
0.0000 [IU] | SUBCUTANEOUS | Status: DC
  Administered 2014-07-17 – 2014-07-18 (×2): 5 [IU] via SUBCUTANEOUS
  Administered 2014-07-18: 3 [IU] via SUBCUTANEOUS
  Administered 2014-07-18: 5 [IU] via SUBCUTANEOUS
  Administered 2014-07-18: 8 [IU] via SUBCUTANEOUS
  Administered 2014-07-18: 3 [IU] via SUBCUTANEOUS
  Administered 2014-07-18: 15 [IU] via SUBCUTANEOUS
  Administered 2014-07-19: 8 [IU] via SUBCUTANEOUS
  Administered 2014-07-19: 15 [IU] via SUBCUTANEOUS

## 2014-07-17 MED ORDER — GADOBENATE DIMEGLUMINE 529 MG/ML IV SOLN
15.0000 mL | Freq: Once | INTRAVENOUS | Status: AC | PRN
  Administered 2014-07-17: 15 mL via INTRAVENOUS

## 2014-07-17 MED ORDER — SODIUM CHLORIDE 0.9 % IJ SOLN
3.0000 mL | Freq: Two times a day (BID) | INTRAMUSCULAR | Status: DC
  Administered 2014-07-19 – 2014-07-23 (×8): 3 mL via INTRAVENOUS

## 2014-07-17 MED ORDER — SODIUM CHLORIDE 0.9 % IV SOLN
INTRAVENOUS | Status: AC
  Administered 2014-07-17: 21:00:00 via INTRAVENOUS

## 2014-07-17 MED ORDER — DEXAMETHASONE SODIUM PHOSPHATE 10 MG/ML IJ SOLN
10.0000 mg | Freq: Once | INTRAMUSCULAR | Status: AC
  Administered 2014-07-17: 10 mg via INTRAVENOUS
  Filled 2014-07-17: qty 1

## 2014-07-17 NOTE — H&P (Signed)
Date: 07/17/2014               Patient Name:  Vanessa Calderon MRN: [id number]  DOB: 08/08/1946 Age / Sex: 68 y.o., female   PCP: Bartholomew Crews, MD         Medical Service: Internal Medicine Teaching Service         Attending Physician: Dr. Aldine Contes, MD    First Contact: Dr. Hulen Luster Pager: [phone]  Second Contact: Dr. Denton Brick Pager: [phone]       After Hours (After 5p/  First Contact Pager: [phone]  weekends / holidays): Second Contact Pager: [phone]   Chief Complaint: Dysarthria, gait abnormality  History of Present Illness: 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 Her family noted the gait abnormality started Wednesday with shuffling and trouble with balance. She fell this morning. No LOC or head trauma. Her speech has been slurred as well for the past 2 days.  She has occasional cough productive of green/yellow sputum. Denies fevers, chills, vision changes, shortness of breath, chest pain, nausea, vomiting, abdominal pain, diarrhea, dysuria, heamturia, rash, bruising/bleeding problems, arthralgias, myalgias, paresthesias, headache.  She was in the ED 06/26/2014 because the electricity at her apartment had been cut off. She was noted to be hypoxic with O2 sat 88% on room air. CXR demonstrated new right hilar and perihilar opacities. She left AMA from the ED but later had a CT chest with contrast on 07/09/2014 which showed irregular-shaped infiltrative mass in the central aspect of the right middle lobe measuring approximately 4.3 x 4.3 x 2.0 cm, with extensive right hilar, mediastinal and right supraclavicular lymphadenopathy.  Meds: No current facility-administered medications for this encounter.     Allergies: Allergies as of 07/17/2014 - Review Complete 07/17/2014  Allergen Reaction Noted  . Chantix [varenicline]  04/22/2012  . Codeine    . Codeine phosphate  05/01/2008  . Penicillins     Past Medical History  Diagnosis Date  . CORONARY ARTERY DISEASE 05/15/2008    CABG 2006, DES to the saphenous vein graft to the ramus in December of 2006.  ECHO 2010 : basal inferior wall akinesis. Post-op septal wall dyssynchrony. Grade 1diastolic dysfunction.       [name] DIABETES MELLITUS, TYPE II 05/15/2008    Well controlled on oral meds.    [name] HYPERLIPIDEMIA 05/11/2008    Mgmt with statin    . HYPERTENSION 05/15/2008    Needs BB 2/2 CAD and ACEI 2/2 DM.     [name] Overweight (BMI 25.0-29.9) 03/27/2012  . Tobacco use 03/27/2012    She has tried Chantix in past with intolerable side effects.   [name] CVA 09/23/2009    Left CEA May 2006 at same time as CABG 2010 : Acute left hemisphere infarct of posterior limb internal capsule and lateral thalamus.  Prior CVA's but dates unknown, imaging shows widespread multifocal remote lacunar infarcts throughout the brain and brainstem.  MRA 2010 : no severe proximal flow limiting stenosis.    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 Coronary artery bypass graft  May 2006    Dr Servando Snare  . Abdominal hysterectomy    . Cholecystectomy    .  Back surgery      lumbar L3-L5  . Exploratory laparotomy  7/03    Removed the cervix and now has vaginal cuff  . Carotid endarterectomy  May 2006    Left - at same time as CABG. Dr Tenna Delaine.  . Angioplasty / stenting femoral  11/03/2010    B aorto-fem angiogram with artherectomy and stenting of the R SFA 2/2 claudication.  . Cystoscopy  01/2013    Normal. Dr Matilde Sprang   History reviewed. No pertinent family history. History   Social History  . Marital Status: Single    Spouse Name: N/A    Number of Children: N/A  . Years of Education: 12   Occupational  History  .  Unemployed   Social History Main Topics  . Smoking status: Current Every Day Smoker -- 1.00 packs/day    Types: Cigarettes  . Smokeless tobacco: Current User     Comment: trying to cut back.  . Alcohol Use: No  . Drug Use: No  . Sexual Activity: Not on file   Other Topics Concern  . Not on file   Social History Narrative   Pt grew up in [place]. Has daughter in [place]. Divorced. Lived in [place] about 2010 but then returned to [place] for a couple of yrs and returned to [place] in 2013. No family here but has friends. Some gait instability since CVA but no freq falls. Decreased vision in L eye. Has license but no car. Lives in apartment independently.     Review of Systems: Constitutional: no fevers/chills Eyes: no vision changes Ears, nose, mouth, throat, and face: +occ cough Respiratory: no shortness of breath Cardiovascular: no chest pain Gastrointestinal: no nausea/vomiting, no abdominal pain, no constipation, no diarrhea Genitourinary: no dysuria, no hematuria Integument: no rash Hematologic/lymphatic: no bleeding/bruising, +LE edema Musculoskeletal: no arthralgias, no myalgias Neurological: no paresthesias, no headache   Physical Exam: Blood pressure 127/57, pulse 80, temperature 98.4 F (36.9 C), temperature source Oral, resp. rate 18, height 5\' 6"  (1.676 m), weight 158 lb 11.7 oz (72 kg), SpO2 96.00%. General Apperance: NAD Head: Normocephalic, atraumatic Eyes: PERRL, EOMI, anicteric sclera Ears: Normal external ear canal Nose: Nares normal, septum midline, mucosa normal Throat: Lips, mucosa and tongue normal  Neck: Supple, trachea midline Back: No tenderness or bony abnormality  Lungs: Clear to auscultation bilaterally. No wheezes, rhonchi or rales. Breathing comfortably on RA Chest Wall: Nontender, no deformity Heart: Regular rate and rhythm, no murmur/rub/gallop Abdomen: Soft, nontender, nondistended, no rebound/guarding Extremities: Normal,  atraumatic, warm and well perfused, no edema Pulses: 2+ throughout Skin: No rashes or lesions Neurologic: Alert and oriented x 3. Thought content appropriate. +Dysarthria. Able to follow commands without difficulty. Otherwise, CNII-XII intact. Normal strength and sensation bilaterally. Normal finger to nose testing.   Lab results: Basic Metabolic Panel:  Recent Labs  07/17/14 1433  NA 139  K 4.3  CL 101  CO2 25  GLUCOSE 304*  BUN 11  CREATININE 0.92  CALCIUM 9.5   Liver Function Tests:  Recent Labs  07/17/14 1433  AST 19  ALT 17  ALKPHOS 129*  BILITOT 0.3  PROT 7.7  ALBUMIN 3.3*   CBC:  Recent Labs  07/17/14 1433  WBC 10.8*  NEUTROABS 7.6  HGB 11.5*  HCT 34.9*  MCV 94.3  PLT 325   Previous Hgb 11.3 07/09/2014  Cardiac Enzymes: Troponin POC 07/17/2014 1437 0.01  Hemoglobin A1C: 05/21/2014 7.6  Coagulation:  Recent Labs  07/17/14 1433  LABPROT 15.2  INR  1.18    Imaging results:  Ct Head (brain) Wo Contrast  07/17/2014   CLINICAL DATA:  Fall, speech difficulty, aphasia, worsening over the past week.  EXAM: CT HEAD WITHOUT CONTRAST  TECHNIQUE: Contiguous axial images were obtained from the base of the skull through the vertex without contrast.  COMPARISON:  04/07/2010  FINDINGS: In the posterior fossa, there is a 3.7 x 3.9 cm heterogeneous hyperdense intra-axial mass centered in the right cerebellum. There is adjacent vasogenic edema and mass effect with displacement of the fourth ventricle to the left. 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  Age related background atrophy noted. Chronic white matter microvascular ischemic change throughout the cerebral white matter about the ventricles. Mild ventricular enlargement without definite hydrocephalus. Remote bilateral basal ganglia lacunar-type infarcts. Slight mass effect upon the right basilar cistern. No extra-axial fluid collection.   Mastoids and sinuses remain clear.  IMPRESSION: 3.7 x 3.9 cm heterogeneous hyperdense right cerebellar intra-axial mass with surrounding edema and adjacent mass effect as described. Suspect intracranial metastasis given the chest CT findings from 07/09/2014. Recommend further evaluation with brain MRI without and with contrast.  These results were called by telephone at the time of interpretation on 07/17/2014 at 3:27 pm to Dr. Daleen Bo, who verbally acknowledged these results.   Electronically Signed   By: Daryll Brod M.D.   On: 07/17/2014 15:32   Mr Jeri Cos KD Contrast  07/17/2014   CLINICAL DATA:  Lung cancer.  Abnormal head CT.  EXAM: MRI HEAD WITHOUT AND WITH CONTRAST  TECHNIQUE: Multiplanar, multiecho pulse sequences of the brain and surrounding structures were obtained without and with intravenous contrast.  CONTRAST:  11mL MULTIHANCE GADOBENATE DIMEGLUMINE 529 MG/ML IV SOLN  COMPARISON:  CT head 07/17/2014  FINDINGS: Large hemorrhagic mass in the right lateral cerebellum compatible with metastatic disease. This measures approximately 4.2 x 3.6 cm. There is edema with mass effect on the fourth ventricle which is displaced to the left. High-density on CT corresponds to significant hemorrhage within the tumor.  Hemorrhagic tumor in the left medial frontal lobe. This shows mild enhancement but no significant edema or mass effect.  Metastatic disease in the posterior sella. The sella is chronically enlarged however there is now enhancing soft tissue mass dorsally in the sella measuring 7 x 14 mm. This may be bony metastasis in the dorsum sella or may be within the infundibulum or pituitary.  Chronic hemorrhagic infarcts in the basal ganglia bilaterally. Chronic ischemia in the pons and left cerebellum. Chronic microvascular ischemia in the cerebral white matter. No acute infarct.  IMPRESSION: Large hemorrhagic metastatic deposit in the right lateral cerebellum with edema and mass-effect on the fourth  ventricle. No hydrocephalus.  Hemorrhagic metastatic deposit approximately 12 mm left medial frontal lobe without significant edema.  Metastatic disease in the dorsal sella which may be metastatic disease to the infundibulum and pituitary or possibly bony metastatic disease in the dorsum sella.  Chronic ischemic changes as above.  No acute infarct.   Electronically Signed   By: Franchot Gallo M.D.   On: 07/17/2014 21:06    Other results: EKG: Normal sinus rhythm, QTc prolonged at 581ms but improved form previous EKG, nonspecific changes  Assessment & Plan by Problem: Principal Problem:   Brain metastasis Active Problems:   Diabetes mellitus type 2 in obese   HLD (hyperlipidemia)   Essential hypertension   Coronary atherosclerosis   Tobacco use   Mass of  middle lobe of right lung  Metastatic lung cancer with brain metastases: Pt with h/o tobacco abuse, and on ED visit 10/9 which was initially for social reasons, she was found to have a cough and hypoxia. A CXR showed new right hilar and perihilar opacities concerning for adenopathy or mass. CT chest showed a large irregular shaped infiltrative mass of the right middle lobe measuring 4.3x4.3x2.0cm with extensive right hilar, mediastinal and right supraclavicular lymphadenopathy, presumed to be non-small cell bronchogenic carcinoma, compatible with at least stage IIIB disease. Pulmonology was consulted and recommended an IR guided biopsy. The case was discussed with the patient's daughter by her PCP, and the decision was made for the patient to move to [place] with her daughter and have the biopsy and workup done there. She presents today with increased dysarthria and gait disturbance resulting in frequent falls over approx the past week. She presented to the ED where CT head showed 3.7 x 3.9 cm heterogeneous hyperdense right cerebellar intra-axial mass with surrounding edema and adjacent mass effect. Neurosurgery was consulted and recommended MRI  to further categorize and stage the mass. Decadron was also administered in the setting of edema. MRI brain demonstrates large hemorrhagic metastatic deposit in the right lateral cerebellum with edema and mass-effect on the fourth ventricle. Hemorrhagic metastatic deposit approximately 12 mm left medial frontal lobe without significant edema.  Metastatic disease in the dorsal sella which may be metastatic disease to the infundibulum and pituitary or possibly bony metastatic disease in the dorsum sella. - Admit to IMTS to tele  - Continue Decadron 10mg  Q6hr for 4 doses then 6mg  Q6hr for 4 doses then 4mg  (will need to clarify the rest of taper) per Neurosurgery  - F/u further Neurosurgery recommendations  - Neuro checks q2h  - Up with assistance  - NPO - PT/OT  DM2: A1c 7.6 on 05/21/14. She is on Metformin 500mg  BID at home, which was held on admission.  -SSI-moderate w/ CBGs q4h  HTN: Stable. Pt on Toprol XL 25mg  daily and lisinopril 10mg  daily at home. BP well controlled on admission, running 120s/50s. Will hold her home meds at this time, which will likely need to be restarted over the next few days.   HLD: On Crestor 10mg  QPM at home which will be continue this admission once she has a diet.   CAD: Stable. No active chest pain. H/o CABG '06 and DES placed in graft to ramus 12/06. She is on a BB and ASA at home, which were initially held on admission while NPO. EKG with nonspecific changes and no acute ischemic changes. Troponin in ED 0.01 -home ASA 325mg  daily held -Repeat EKG -Telemetry  Urinary incontinence: Evaluated by urology 01/2013. Mixed stress and urge incontinence/overactive bladder and a neurogenic bladder with a functional component. She is on fesoterodine 8mg  daily -Home fesoterodine 8mg  daily held  FEN: -NPO -NS @ 100cc/hr  DVT PPx: SCDs   Dispo: Disposition is deferred at this time, awaiting improvement of current medical problems. Anticipated discharge in approximately  2-3 day(s).   The patient does have a current PCP Bartholomew Crews, MD) and does need an [facility] hospital follow-up appointment after discharge.  The patient does not have transportation limitations that hinder transportation to clinic appointments.  Signed: Jacques Earthly, MD 07/17/2014, 7:08 PM

## 2014-07-17 NOTE — ED Notes (Signed)
Per family, pt has hx of stroke with slurred speech as deficit. Pt having increase in slurred speech for over the past week. Facial droop and slurred speech noted at triage, airway intact.

## 2014-07-17 NOTE — Progress Notes (Signed)
Patient came back from MRI. NT and RN cleaned her up, call bell is by her side, CBG taken and tele placed. Patient is upset that she is not able to eat or drink tonight. Will continue to monitor. Rich Paprocki, Rande Brunt

## 2014-07-17 NOTE — Consult Note (Signed)
Reason for Consult: Brain mass Referring Physician: Emergency department Dr. Glendora Score is an 68 y.o. female.  HPI: Patient is a 68 year old female who was recently diagnosed with lung cancer last week who was brought to the emerge department with dysarthria and balance difficulty. Workup with CT scan showed a large right cerebellar mass and we have been consult. Patient currently reports difficulty with speech that she says been going on for a couple weeks her daughter says is been noticeable over the last week or so. She also had multiple falls she feels like she gets dizzy but can't pin the history down anymore with any more detail in that. She denies any headaches or nausea or vomiting denies any weakness necessarily on one side of the body or the other.  Past Medical History  Diagnosis Date  . CORONARY ARTERY DISEASE 05/15/2008    CABG 2006, DES to the saphenous vein graft to the ramus in December of 2006.  ECHO 2010 : basal inferior wall akinesis. Post-op septal wall dyssynchrony. Grade 1diastolic dysfunction.       [name] DIABETES MELLITUS, TYPE II 05/15/2008    Well controlled on oral meds.    [name] HYPERLIPIDEMIA 05/11/2008    Mgmt with statin    . HYPERTENSION 05/15/2008    Needs BB 2/2 CAD and ACEI 2/2 DM.     [name] Overweight (BMI 25.0-29.9) 03/27/2012  . Tobacco use 03/27/2012    She has tried Chantix in past with intolerable side effects.   [name] CVA 09/23/2009    Left CEA May 2006 at same time as CABG 2010 : Acute left hemisphere infarct of posterior limb internal capsule and lateral thalamus.  Prior CVA's but dates unknown, imaging shows widespread multifocal remote lacunar infarcts throughout the brain and brainstem.  MRA 2010 : no severe proximal flow limiting stenosis.    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 Coronary artery bypass graft  May 2006    Dr Servando Snare  . Abdominal hysterectomy    . Cholecystectomy    .  Back surgery      lumbar L3-L5  . Exploratory laparotomy  7/03    Removed the cervix and now has vaginal cuff  . Carotid endarterectomy  May 2006    Left - at same time as CABG. Dr Tenna Delaine.  . Angioplasty / stenting femoral  11/03/2010    B aorto-fem angiogram with artherectomy and stenting of the R SFA 2/2 claudication.  . Cystoscopy  01/2013    Normal. Dr Matilde Sprang    History reviewed. No pertinent family history.  Social History:  reports that she has been smoking Cigarettes.  She has been smoking about 1.00 pack per day. She uses smokeless tobacco. She reports that she does not drink alcohol or use illicit drugs.  Allergies:  Allergies  Allergen Reactions  . Chantix [Varenicline]     Pt couldn't tolerate. Unspecified side effects.   . Codeine     REACTION: hives  . Codeine Phosphate   . Penicillins     REACTION: hives    Medications: I have reviewed the patient's current medications.  Results for orders placed during the hospital encounter of 07/17/14 (from the past 48 hour(s))  PROTIME-INR     Status: None   Collection Time    07/17/14  2:33 PM      Result Value Ref Range   Prothrombin Time 15.2  11.6 -  15.2 seconds   INR 1.18  0.00 - 1.49  APTT     Status: None   Collection Time    07/17/14  2:33 PM      Result Value Ref Range   aPTT 33  24 - 37 seconds  CBC     Status: Abnormal   Collection Time    07/17/14  2:33 PM      Result Value Ref Range   WBC 10.8 (*) 4.0 - 10.5 K/uL   RBC 3.70 (*) 3.87 - 5.11 MIL/uL   Hemoglobin 11.5 (*) 12.0 - 15.0 g/dL   HCT 34.9 (*) 36.0 - 46.0 %   MCV 94.3  78.0 - 100.0 fL   MCH 31.1  26.0 - 34.0 pg   MCHC 33.0  30.0 - 36.0 g/dL   RDW 13.4  11.5 - 15.5 %   Platelets 325  150 - 400 K/uL  DIFFERENTIAL     Status: None   Collection Time    07/17/14  2:33 PM      Result Value Ref Range   Neutrophils Relative % 70  43 - 77 %   Neutro Abs 7.6  1.7 - 7.7 K/uL   Lymphocytes Relative 24  12 - 46 %   Lymphs Abs 2.6  0.7 - 4.0  K/uL   Monocytes Relative 6  3 - 12 %   Monocytes Absolute 0.6  0.1 - 1.0 K/uL   Eosinophils Relative 0  0 - 5 %   Eosinophils Absolute 0.0  0.0 - 0.7 K/uL   Basophils Relative 0  0 - 1 %   Basophils Absolute 0.0  0.0 - 0.1 K/uL  COMPREHENSIVE METABOLIC PANEL     Status: Abnormal   Collection Time    07/17/14  2:33 PM      Result Value Ref Range   Sodium 139  137 - 147 mEq/L   Potassium 4.3  3.7 - 5.3 mEq/L   Chloride 101  96 - 112 mEq/L   CO2 25  19 - 32 mEq/L   Glucose, Bld 304 (*) 70 - 99 mg/dL   BUN 11  6 - 23 mg/dL   Creatinine, Ser 0.92  0.50 - 1.10 mg/dL   Calcium 9.5  8.4 - 10.5 mg/dL   Total Protein 7.7  6.0 - 8.3 g/dL   Albumin 3.3 (*) 3.5 - 5.2 g/dL   AST 19  0 - 37 U/L   Comment: HEMOLYSIS AT THIS LEVEL MAY AFFECT RESULT   ALT 17  0 - 35 U/L   Alkaline Phosphatase 129 (*) 39 - 117 U/L   Total Bilirubin 0.3  0.3 - 1.2 mg/dL   GFR calc non Af Amer 63 (*) >90 mL/min   GFR calc Af Amer 74 (*) >90 mL/min   Comment: (NOTE)     The eGFR has been calculated using the CKD EPI equation.     This calculation has not been validated in all clinical situations.     eGFR's persistently <90 mL/min signify possible Chronic Kidney     Disease.   Anion gap 13  5 - 15  I-STAT TROPOININ, ED     Status: None   Collection Time    07/17/14  2:37 PM      Result Value Ref Range   Troponin i, poc 0.01  0.00 - 0.08 ng/mL   Comment 3            Comment: Due to the release kinetics of cTnI,  a negative result within the first hours     of the onset of symptoms does not rule out     myocardial infarction with certainty.     If myocardial infarction is still suspected,     repeat the test at appropriate intervals.    Ct Head (brain) Wo Contrast  07/17/2014   CLINICAL DATA:  Fall, speech difficulty, aphasia, worsening over the past week.  EXAM: CT HEAD WITHOUT CONTRAST  TECHNIQUE: Contiguous axial images were obtained from the base of the skull through the vertex without contrast.   COMPARISON:  04/07/2010  FINDINGS: In the posterior fossa, there is a 3.7 x 3.9 cm heterogeneous hyperdense intra-axial mass centered in the right cerebellum. There is adjacent vasogenic edema and mass effect with displacement of the fourth ventricle to the left. 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  Age related background atrophy noted. Chronic white matter microvascular ischemic change throughout the cerebral white matter about the ventricles. Mild ventricular enlargement without definite hydrocephalus. Remote bilateral basal ganglia lacunar-type infarcts. Slight mass effect upon the right basilar cistern. No extra-axial fluid collection.  Mastoids and sinuses remain clear.  IMPRESSION: 3.7 x 3.9 cm heterogeneous hyperdense right cerebellar intra-axial mass with surrounding edema and adjacent mass effect as described. Suspect intracranial metastasis given the chest CT findings from 07/09/2014. Recommend further evaluation with brain MRI without and with contrast.  These results were called by telephone at the time of interpretation on 07/17/2014 at 3:27 pm to Dr. Daleen Bo, who verbally acknowledged these results.   Electronically Signed   By: Daryll Brod M.D.   On: 07/17/2014 15:32    Review of Systems  Constitutional: Negative.   HENT: Negative.   Eyes: Negative.   Respiratory: Positive for cough and shortness of breath.   Cardiovascular: Negative.   Gastrointestinal: Negative.   Genitourinary: Negative.   Musculoskeletal: Negative.   Skin: Negative.   Neurological: Positive for dizziness and speech change.  Endo/Heme/Allergies: Negative.   Psychiatric/Behavioral: Negative.    Blood pressure 107/58, pulse 87, temperature 98.4 F (36.9 C), temperature source Oral, resp. rate 17, height 5' 6" (1.676 m), weight 77.565 kg (171 lb), SpO2 97.00%. Physical Exam  Constitutional: She is oriented to person, place, and time.  She appears well-developed and well-nourished.  HENT:  Head: Normocephalic.  Eyes: Pupils are equal, round, and reactive to light.  Neck: Normal range of motion.  Cardiovascular: Normal rate.   Respiratory: Effort normal.  GI: Soft. Bowel sounds are normal.  Musculoskeletal: Normal range of motion.  Neurological: She is alert and oriented to person, place, and time. She has normal strength.  Patient is awake and alert pupils are equal extraocular movements are intact speech is very dysarthric although she has no focal cranial neuropathies. Strength appears to be 5-5 and her upper and lower extremity is without focal deficits she is markedly dysmetric bilaterally  Skin: Skin is warm and dry.    Assessment/Plan: 68 year old female with what appears to be metastatic lung cancer and a large right cerebellar mass based on CT scan. I recommend we proceed for with a brain MRI with and without contrast. Initiate Decadron and keep the patient on steroids at 10 mg IV every 6 for 4 doses then cannot chew down to 6 mg IV every 6 for 4 doses than 4. We'll need to complete staging for her metastatic lung cancer. I Explained to the patient and the patient's daughter that  more than likely the right cerebellar mass would need to be operated on and the patient flatly refuses surgery at this point. Apparently the patient had expressed to her daughter that she did not like being in the hospital did not like her quality of life and did not care to do anything to prolong her life at this point. I extensively explained to the daughter that surgery would not be curative but it would help make a diagnosis as well as improved quality of life for the next several weeks or months. We will continue to talk with the patient and the daughter will continue to talk the patient will obtain the MRI scan and keep her on Decadron and observed in the hospital patient will be admitted to the medicine service to complete her metastatic  workup.  , P 07/17/2014, 5:42 PM

## 2014-07-17 NOTE — ED Provider Notes (Signed)
TIME SEEN: 3:50 PM  CHIEF COMPLAINT: Dysarthria, difficulty ambulating  HPI: Patient is a 68 y.o. F with history of coronary artery disease status post CABG, hypertension, hyperlipidemia, diabetes, prior CVA who presents to the emergency department with complaints of worsening dysarthria and difficulty ambulating over the past week. She denies any numbness, tingling or focal weakness. No head injury. No chest pain, shortness of breath, vomiting or diarrhea. She was recently seen by her primary care physician and diagnosed with a chest mass and is scheduling further outpatient workup for this. She is currently a smoker. Patient denies anticoagulation. Denies headaches. No weight loss or night sweats.  ROS: See HPI Constitutional: no fever  Eyes: no drainage  ENT: no runny nose   Cardiovascular:  no chest pain  Resp: no SOB  GI: no vomiting GU: no dysuria Integumentary: no rash  Allergy: no hives  Musculoskeletal: no leg swelling  Neurological: no slurred speech ROS otherwise negative  PAST MEDICAL HISTORY/PAST SURGICAL HISTORY:  Past Medical History  Diagnosis Date  . CORONARY ARTERY DISEASE 05/15/2008    CABG 2006, DES to the saphenous vein graft to the ramus in December of 2006.  ECHO 2010 : basal inferior wall akinesis. Post-op septal wall dyssynchrony. Grade 1diastolic dysfunction.       [name] DIABETES MELLITUS, TYPE II 05/15/2008    Well controlled on oral meds.    [name] HYPERLIPIDEMIA 05/11/2008    Mgmt with statin    . HYPERTENSION 05/15/2008    Needs BB 2/2 CAD and ACEI 2/2 DM.     [name] Overweight (BMI 25.0-29.9) 03/27/2012  . Tobacco use 03/27/2012    She has tried Chantix in past with intolerable side effects.   [name] CVA 09/23/2009    Left CEA May 2006 at same time as CABG 2010 : Acute left hemisphere infarct of posterior limb internal capsule and lateral thalamus.  Prior CVA's but dates unknown, imaging shows widespread multifocal remote lacunar infarcts throughout the brain and brainstem.   MRA 2010 : no severe proximal flow limiting stenosis.    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 Authorizing Provider  aspirin 325 MG tablet Take 325 mg by mouth daily.   Yes Historical Provider, MD  fesoterodine (TOVIAZ) 8 MG TB24 tablet Take 1 tablet (8 mg total) by mouth daily. 06/12/13  Yes Bartholomew Crews, MD  ibuprofen (ADVIL,MOTRIN) 800 MG tablet Take 800 mg by mouth every 8 (eight) hours as needed for mild pain.   Yes Historical Provider, MD  lisinopril (PRINIVIL,ZESTRIL) 10 MG tablet Take 1 tablet (10 mg total) by mouth daily. 05/06/14  Yes Bartholomew Crews, MD  metFORMIN (GLUCOPHAGE) 500 MG tablet Take 1 tablet (500 mg total) by mouth 2 (two) times daily with a meal. 06/19/14  Yes Otho Bellows, MD  rosuvastatin (CRESTOR) 10 MG tablet Take 10 mg by mouth every evening.   Yes Historical Provider, MD  metoprolol succinate (TOPROL XL) 25 MG 24 hr tablet Take 1 tablet (25 mg total) by mouth daily. 05/21/14 05/21/15  Bartholomew Crews, MD    ALLERGIES:  Allergies  Allergen Reactions  . Chantix [Varenicline]     Pt couldn't tolerate. Unspecified side effects.   . Codeine     REACTION: hives  . Codeine Phosphate   . Penicillins     REACTION: hives    SOCIAL HISTORY:  History  Substance Use  Topics  . Smoking status: Current Every Day Smoker -- 1.00 packs/day    Types: Cigarettes  . Smokeless tobacco: Current User     Comment: trying to cut back.  . Alcohol Use: No    FAMILY HISTORY: History reviewed. No pertinent family history.  EXAM: BP 107/58  Pulse 87  Temp(Src) 98.4 F (36.9 C) (Oral)  Resp 17  Ht 5\' 6"  (1.676 m)  Wt 171 lb (77.565 kg)  BMI 27.61 kg/m2  SpO2 97% CONSTITUTIONAL: Alert and oriented and responds appropriately to questions. Well-appearing; well-nourished HEAD: Normocephalic EYES: Conjunctivae clear, PERRL ENT: normal nose; no rhinorrhea;  moist mucous membranes; pharynx without lesions noted NECK: Supple, no meningismus, no LAD  CARD: RRR; S1 and S2 appreciated; no murmurs, no clicks, no rubs, no gallops RESP: Normal chest excursion without splinting or tachypnea; breath sounds clear and equal bilaterally; no wheezes, no rhonchi, no rales,  ABD/GI: Normal bowel sounds; non-distended; soft, non-tender, no rebound, no guarding BACK:  The back appears normal and is non-tender to palpation, there is no CVA tenderness EXT: Normal ROM in all joints; non-tender to palpation; no edema; normal capillary refill; no cyanosis    SKIN: Normal color for age and race; warm NEURO: Moves all extremities equally, patient is dysarthric, sensation to light touch intact diffusely, cranial nerves II through XII intact PSYCH: The patient's mood and manner are appropriate. Grooming and personal hygiene are appropriate.  MEDICAL DECISION MAKING: Patient here with dysarthria and difficulty ambulating. Labs ordered in triage are unremarkable. CT of her head shows a right cerebellar mass with mass effect and edema. Discussed with Dr. Saintclair Halsted with neurosurgery who recommends giving the patient Decadron and keeping her nothing by mouth as she may need surgery tonight. He recommends admission to medicine for medical workup as this is likely metastasis from her lung cancer. Discussed with internal medicine teaching service who agrees with admission to medical bed. Patient and family updated with findings and plan.       EKG Interpretation  Date/Time:  Friday July 17 2014 14:22:04 EDT Ventricular Rate:  96 PR Interval:  150 QRS Duration: 74 QT Interval:  402 QTC Calculation: 507 R Axis:   42 Text Interpretation:  Normal sinus rhythm with sinus arrhythmia Possible Inferior infarct , age undetermined Anterior infarct , age undetermined Abnormal ECG No significant change since last tracing Confirmed by WARD,  DO, KRISTEN [phone]) on 07/17/2014 3:56:39 PM         [place], DO 07/18/14 1219

## 2014-07-17 NOTE — Progress Notes (Signed)
Physician called and stated that the patient could have some water to drink before midnight and then would be kept NPO furthermore. Pt resting comfortably at this time. Will continue to monitor quietly. Melayna Robarts, Rande Brunt

## 2014-07-17 NOTE — Progress Notes (Signed)
Report received from ED RN. Pt coming to [id number]. All questions answered.

## 2014-07-18 DIAGNOSIS — I2510 Atherosclerotic heart disease of native coronary artery without angina pectoris: Secondary | ICD-10-CM

## 2014-07-18 DIAGNOSIS — E785 Hyperlipidemia, unspecified: Secondary | ICD-10-CM

## 2014-07-18 DIAGNOSIS — C7931 Secondary malignant neoplasm of brain: Principal | ICD-10-CM

## 2014-07-18 DIAGNOSIS — I10 Essential (primary) hypertension: Secondary | ICD-10-CM

## 2014-07-18 DIAGNOSIS — E119 Type 2 diabetes mellitus without complications: Secondary | ICD-10-CM

## 2014-07-18 DIAGNOSIS — R32 Unspecified urinary incontinence: Secondary | ICD-10-CM

## 2014-07-18 DIAGNOSIS — C3490 Malignant neoplasm of unspecified part of unspecified bronchus or lung: Secondary | ICD-10-CM

## 2014-07-18 LAB — GLUCOSE, CAPILLARY
GLUCOSE-CAPILLARY: 145 mg/dL — AB (ref 70–99)
GLUCOSE-CAPILLARY: 196 mg/dL — AB (ref 70–99)
GLUCOSE-CAPILLARY: 236 mg/dL — AB (ref 70–99)
Glucose-Capillary: 154 mg/dL — ABNORMAL HIGH (ref 70–99)
Glucose-Capillary: 217 mg/dL — ABNORMAL HIGH (ref 70–99)
Glucose-Capillary: 256 mg/dL — ABNORMAL HIGH (ref 70–99)
Glucose-Capillary: 265 mg/dL — ABNORMAL HIGH (ref 70–99)
Glucose-Capillary: 345 mg/dL — ABNORMAL HIGH (ref 70–99)

## 2014-07-18 LAB — CBC
HCT: 39.2 % (ref 36.0–46.0)
HEMOGLOBIN: 12.7 g/dL (ref 12.0–15.0)
MCH: 31.1 pg (ref 26.0–34.0)
MCHC: 32.4 g/dL (ref 30.0–36.0)
MCV: 96.1 fL (ref 78.0–100.0)
Platelets: 370 10*3/uL (ref 150–400)
RBC: 4.08 MIL/uL (ref 3.87–5.11)
RDW: 13.5 % (ref 11.5–15.5)
WBC: 11.5 10*3/uL — ABNORMAL HIGH (ref 4.0–10.5)

## 2014-07-18 LAB — BASIC METABOLIC PANEL
Anion gap: 11 (ref 5–15)
BUN: 11 mg/dL (ref 6–23)
CO2: 26 meq/L (ref 19–32)
CREATININE: 0.8 mg/dL (ref 0.50–1.10)
Calcium: 9.9 mg/dL (ref 8.4–10.5)
Chloride: 108 mEq/L (ref 96–112)
GFR calc non Af Amer: 75 mL/min — ABNORMAL LOW (ref >90)
GFR, EST AFRICAN AMERICAN: 87 mL/min — AB (ref >90)
Glucose, Bld: 181 mg/dL — ABNORMAL HIGH (ref 70–99)
Potassium: 4.4 mEq/L (ref 3.7–5.3)
Sodium: 145 mEq/L (ref 137–147)

## 2014-07-18 MED ORDER — DEXAMETHASONE SODIUM PHOSPHATE 10 MG/ML IJ SOLN
10.0000 mg | Freq: Four times a day (QID) | INTRAMUSCULAR | Status: DC
  Administered 2014-07-18 – 2014-07-19 (×5): 10 mg via INTRAVENOUS
  Filled 2014-07-18 (×5): qty 1

## 2014-07-18 MED ORDER — SODIUM CHLORIDE 0.9 % IV SOLN: INTRAVENOUS | Status: DC

## 2014-07-18 MED ORDER — DEXAMETHASONE SODIUM PHOSPHATE 4 MG/ML IJ SOLN
6.0000 mg | Freq: Once | INTRAMUSCULAR | Status: AC
  Administered 2014-07-18: 6 mg via INTRAVENOUS
  Filled 2014-07-18 (×2): qty 2

## 2014-07-18 NOTE — H&P (Signed)
Pt seen and examined. Please refer to resident note for details  In brief, pt is a 68 y/o female with PMH of CAD s/p CABG, DES *1, DM, HLD, HTN, CVA with residual R hemiparesis and recently diagnosed R lung mass concerning for malignancy p/w dysarthria and difficulty ambulating * 2-3 days. Pt was in the ED 10/9 and was noted to be hypoxic in the 80s and had a CXR done which showed R hilar and perihilar opacities. She left AMA but had an outpatient CT which showed R middle lobe mass with extensive lymhadenopathy. She was going to go to [place] with her daughter who lives there to get itbworked up but approx 2-3 days ago her daughter noted that pt was having difficulty walking and had falls at home. Pt was also noted to have difficulty talking and daughter was having difficulty understanding her. No fevers, no HA. No blurry vision, no syncope, no CP, no sob, no palpitations, no diaphoresis. Remaining ROS negative  Assessment and Plan: 68 y/o female with gait abnormalities, dysarthria secondary to brain mets  Metastatic lung cancer with brain mets: - MRI with multiple brain mets - Neurosurgery follow up noted- pt currently refusing intervention for R cerebellar mass despite risk of worsening symptoms/death - c/w decadron per NS - c/w neuro checks q 4 hrs - c/w dysphagia 3 diet per SLP - PT/OT eval - Pt refusing biopsy at this time. Will continue to talk with her regarding importance of biopsy - Psych eval to determine capacity  DM: - c/w sliding scale insulin while on decadron - Resume home meds on d/c  Case d/w pt and residents in detail. Pt does desire to leave AMA if she can. I will get psych to determine capacity prior to d/c

## 2014-07-18 NOTE — Progress Notes (Addendum)
Subjective: Pt family at bedside. Daughter says her speech has improved compared to on admission. Pt to have swallow eval, she is not scheduled for any procedure anymore- Per neurosugery. But concern for aspiration, but pt is insistent on drinking or eating or leaving today.  Pt presently refusing further workup- IR biospy to define mass, but daughter said pt has always been saying previously that she wants all work up done. Not sure about her capacity to make decisions considering diagnosis of brain mets.  Objective: Vital signs in last 24 hours: Filed Vitals:   07/18/14 0227 07/18/14 0433 07/18/14 0620 07/18/14 0921  BP: 105/62 123/58 127/56 116/75  Pulse: 71 71 76 99  Temp: 97.7 F (36.5 C) 97.5 F (36.4 C) 97.3 F (36.3 C) 98.3 F (36.8 C)  TempSrc: Oral Oral Oral Oral  Resp: 18 16 16 20   Height:      Weight:      SpO2: 96% 96% 96% 97%   Weight change:  No intake or output data in the 24 hours ending 07/18/14 1329  Physical Exam-  General appearance: alert, cooperative, appears stated age and no distress Head: Normocephalic, without obvious abnormality, atraumatic Lungs: clear to auscultation bilaterally Heart: regular rate and rhythm, S1, S2 normal, no murmur, click, rub or gallop Abdomen: soft, non-tender; bowel sounds normal; no masses,  no organomegaly Extremities: extremities normal, atraumatic, no cyanosis or edema Skin: Skin color, texture, turgor normal. No rashes or lesions  Lab Results: Basic Metabolic Panel:  Recent Labs Lab 07/17/14 1433 07/18/14 0735  NA 139 145  K 4.3 4.4  CL 101 108  CO2 25 26  GLUCOSE 304* 181*  BUN 11 11  CREATININE 0.92 0.80  CALCIUM 9.5 9.9   Liver Function Tests:  Recent Labs Lab 07/17/14 1433  AST 19  ALT 17  ALKPHOS 129*  BILITOT 0.3  PROT 7.7  ALBUMIN 3.3*   CBC:  Recent Labs Lab 07/17/14 1433 07/18/14 0735  WBC 10.8* 11.5*  NEUTROABS 7.6  --   HGB 11.5* 12.7  HCT 34.9* 39.2  MCV 94.3 96.1  PLT 325  370   CBG:  Recent Labs Lab 07/17/14 2106 07/17/14 2359 07/18/14 0409 07/18/14 0440 07/18/14 0851 07/18/14 1243  GLUCAP 245* 217* 145* 154* 196* 236*   Coagulation:  Recent Labs Lab 07/17/14 1433  LABPROT 15.2  INR 1.18   Urine Drug Screen: Drugs of Abuse     Component Value Date/Time   LABOPIA NONE DETECTED 11/22/2009 0206   COCAINSCRNUR NONE DETECTED 11/22/2009 0206   LABBENZ NONE DETECTED 11/22/2009 0206   AMPHETMU NONE DETECTED 11/22/2009 0206   THCU NONE DETECTED 11/22/2009 0206   LABBARB  Value: NONE DETECTED        DRUG SCREEN FOR MEDICAL PURPOSES ONLY.  IF CONFIRMATION IS NEEDED FOR ANY PURPOSE, NOTIFY LAB WITHIN 5 DAYS.        LOWEST DETECTABLE LIMITS FOR URINE DRUG SCREEN Drug Class       Cutoff (ng/mL) Amphetamine      1000 Barbiturate      200 Benzodiazepine   650 Tricyclics       354 Opiates          300 Cocaine          300 THC              50 11/22/2009 0206    Studies/Results: Ct Head (brain) Wo Contrast  07/17/2014   CLINICAL DATA:  Fall, speech difficulty, aphasia, worsening over  the past week.  EXAM: CT HEAD WITHOUT CONTRAST  TECHNIQUE: Contiguous axial images were obtained from the base of the skull through the vertex without contrast.  COMPARISON:  04/07/2010  FINDINGS: In the posterior fossa, there is a 3.7 x 3.9 cm heterogeneous hyperdense intra-axial mass centered in the right cerebellum. There is adjacent vasogenic edema and mass effect with displacement of the fourth ventricle to the left. 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  Age related background atrophy noted. Chronic white matter microvascular ischemic change throughout the cerebral white matter about the ventricles. Mild ventricular enlargement without definite hydrocephalus. Remote bilateral basal ganglia lacunar-type infarcts. Slight mass effect upon the right basilar cistern. No extra-axial fluid collection.  Mastoids and sinuses  remain clear.  IMPRESSION: 3.7 x 3.9 cm heterogeneous hyperdense right cerebellar intra-axial mass with surrounding edema and adjacent mass effect as described. Suspect intracranial metastasis given the chest CT findings from 07/09/2014. Recommend further evaluation with brain MRI without and with contrast.  These results were called by telephone at the time of interpretation on 07/17/2014 at 3:27 pm to Dr. Daleen Bo, who verbally acknowledged these results.   Electronically Signed   By: Daryll Brod M.D.   On: 07/17/2014 15:32   Mr Jeri Cos XT Contrast  07/17/2014   CLINICAL DATA:  Lung cancer.  Abnormal head CT.  EXAM: MRI HEAD WITHOUT AND WITH CONTRAST  TECHNIQUE: Multiplanar, multiecho pulse sequences of the brain and surrounding structures were obtained without and with intravenous contrast.  CONTRAST:  49mL MULTIHANCE GADOBENATE DIMEGLUMINE 529 MG/ML IV SOLN  COMPARISON:  CT head 07/17/2014  FINDINGS: Large hemorrhagic mass in the right lateral cerebellum compatible with metastatic disease. This measures approximately 4.2 x 3.6 cm. There is edema with mass effect on the fourth ventricle which is displaced to the left. High-density on CT corresponds to significant hemorrhage within the tumor.  Hemorrhagic tumor in the left medial frontal lobe. This shows mild enhancement but no significant edema or mass effect.  Metastatic disease in the posterior sella. The sella is chronically enlarged however there is now enhancing soft tissue mass dorsally in the sella measuring 7 x 14 mm. This may be bony metastasis in the dorsum sella or may be within the infundibulum or pituitary.  Chronic hemorrhagic infarcts in the basal ganglia bilaterally. Chronic ischemia in the pons and left cerebellum. Chronic microvascular ischemia in the cerebral white matter. No acute infarct.  IMPRESSION: Large hemorrhagic metastatic deposit in the right lateral cerebellum with edema and mass-effect on the fourth ventricle. No  hydrocephalus.  Hemorrhagic metastatic deposit approximately 12 mm left medial frontal lobe without significant edema.  Metastatic disease in the dorsal sella which may be metastatic disease to the infundibulum and pituitary or possibly bony metastatic disease in the dorsum sella.  Chronic ischemic changes as above.  No acute infarct.   Electronically Signed   By: Franchot Gallo M.D.   On: 07/17/2014 21:06   Medications: I have reviewed the patient's current medications. Scheduled Meds: . dexamethasone  10 mg Intravenous 4 times per day  . insulin aspart  0-15 Units Subcutaneous 6 times per day  . sodium chloride  3 mL Intravenous Q12H   Continuous Infusions:  PRN Meds:. Assessment/Plan: Principal Problem:   Brain metastasis Active Problems:   Diabetes mellitus type 2 in obese   HLD (hyperlipidemia)   Essential hypertension   Coronary atherosclerosis   Tobacco use   Mass of  middle lobe of right lung   Metastatic cancer to brain   Metastatic lung cancer with brain metastases: Diagnosed- 06/26/2014, on chest CT- presumed to be non-small cell bronchogenic carcinoma, compatible with at least stage IIIB disease With MRi findings of- with evidence of mets and mass effect. Neurosurgery was consulted- Pt refusing further procedures, but think surgery might improve quality of her life.  - Continue Decadron 10mg  Q6hr for 4 doses then 6mg  Q6hr for 4 doses then 4mg - NEED CLARIFICATION ON REST OF TAPER, not clear on Neurosurgery note. - Neuro checks q4h  - Up with assistance  - NPO , pending swallow evaluation - PT/OT  - IR consult when pt capacity has been determined. - Psych Consulted.  DM2: A1c 7.6 on 05/21/14. She is on Metformin 500mg  BID at home, which was held on admission.  -SSI-moderate w/ CBGs q4h - increase to Resistant with high decadron dose.  HTN: Stable. Pt on Toprol XL 25mg  daily and lisinopril 10mg  daily at home. BP well controlled on admission, running 120s/50s. Will hold her  home meds at this time, which will likely need to be restarted over the next few days.   HLD: On Crestor 10mg  QPM at home which will be continue this admission once she has a diet.   CAD: Stable. No active chest pain. H/o CABG '06 and DES placed in graft to ramus 12/06. She is on a BB and ASA at home, which were initially held on admission while NPO.   Urinary incontinence: Evaluated by urology 01/2013. Mixed stress and urge incontinence/overactive bladder and a neurogenic bladder with a functional component. She is on fesoterodine 8mg  daily  -Home fesoterodine 8mg  daily held   FEN:  -NPO  -Reduce IV NS @ 75 cc/hr    Dispo: Psych consulted, to help determine Capacity, pt presently wants to go home. If capable of making decisions she can leave AMA, otherwise, HPOA or decision maker should decide if to proceed with IR consult for lung biopsy.  The patient does have a current PCP Bartholomew Crews, MD) and does need an [facility] hospital follow-up appointment after discharge.  The patient does not know have transportation limitations that hinder transportation to clinic appointments.  LOS: 1 day   Vanessa Roys, MD 07/18/2014, 1:29 PM

## 2014-07-18 NOTE — Progress Notes (Signed)
INITIAL NUTRITION ASSESSMENT  DOCUMENTATION CODES Per approved criteria  -Not Applicable   INTERVENTION:  Diet advancement as able per SLP  Recommend Ensure Complete po BID once diet advanced, each supplement provides 350 kcal and 13 grams of protein  NUTRITION DIAGNOSIS: Inadequate oral intake related to inability to eat as evidenced by NPO status.   Goal: Intake to meet >90% of estimated nutrition needs.  Monitor:  Diet advancement, PO intake, labs, weight trend.  Reason for Assessment: MST  68 y.o. female  Admitting Dx: Brain metastasis  ASSESSMENT: 68 year old woman with recently diagnosed R middle lobe lung mass concerning for cancer, presented with dysarthria and gait abnormality on 10/30.  Patient with metastatic lung cancer with a brain lesion. She does not wish to have surgery at this time per review of physician notes. Patient remains NPO at this time. SLP to evaluate patient today for potential diet upgrade. Patient is at nutrition risk, given recent 15% weight loss in 2 months and new dx metastatic lung CA.  Nutrition Focused Physical Exam:  Subcutaneous Fat:  Orbital Region: WNL Upper Arm Region: WNL Thoracic and Lumbar Region: WNL  Muscle:  Temple Region: WNL Clavicle Bone Region: mild depletion Clavicle and Acromion Bone Region: WNL Scapular Bone Region: WNL Dorsal Hand: WNL Patellar Region: WNL Anterior Thigh Region: WNL Posterior Calf Region: WNL  Edema: none   Height: Ht Readings from Last 1 Encounters:  07/17/14 5\' 6"  (1.676 m)    Weight: Wt Readings from Last 1 Encounters:  07/17/14 158 lb 11.7 oz (72 kg)    Ideal Body Weight: 59.1 kg  % Ideal Body Weight: 122%  Wt Readings from Last 10 Encounters:  07/17/14 158 lb 11.7 oz (72 kg)  07/09/14 171 lb 12.8 oz (77.928 kg)  06/19/14 175 lb 12.8 oz (79.742 kg)  05/21/14 185 lb 1.6 oz (83.961 kg)  04/22/14 197 lb (89.359 kg)  02/11/14 187 lb 12.8 oz (85.186 kg)  01/09/14 186 lb 3.2  oz (84.46 kg)  12/08/13 186 lb 3.2 oz (84.46 kg)  10/21/13 186 lb 9.6 oz (84.641 kg)  05/13/13 187 lb 14.4 oz (85.231 kg)    Usual Body Weight: 185 lb 2 months ago  % Usual Body Weight: 85%  BMI:  Body mass index is 25.63 kg/(m^2).  Estimated Nutritional Needs: Kcal: 1900-2100 Protein: 100-115 gm Fluid: 2 L  Skin: WDL  Diet Order: NPO  EDUCATION NEEDS: -Education not appropriate at this time  No intake or output data in the 24 hours ending 07/18/14 1135  Last BM: 10/30   Labs:   Recent Labs Lab 07/17/14 1433 07/18/14 0735  NA 139 145  K 4.3 4.4  CL 101 108  CO2 25 26  BUN 11 11  CREATININE 0.92 0.80  CALCIUM 9.5 9.9  GLUCOSE 304* 181*    CBG (last 3)   Recent Labs  07/17/14 2359 07/18/14 0409 07/18/14 0440  GLUCAP 217* 145* 154*    Scheduled Meds: . dexamethasone  10 mg Intravenous 4 times per day  . insulin aspart  0-15 Units Subcutaneous 6 times per day  . sodium chloride  3 mL Intravenous Q12H    Continuous Infusions:   Past Medical History  Diagnosis Date  . CORONARY ARTERY DISEASE 05/15/2008    CABG 2006, DES to the saphenous vein graft to the ramus in December of 2006.  ECHO 2010 : basal inferior wall akinesis. Post-op septal wall dyssynchrony. Grade 1diastolic dysfunction.       [name] DIABETES MELLITUS,  TYPE II 05/15/2008    Well controlled on oral meds.    [name] HYPERLIPIDEMIA 05/11/2008    Mgmt with statin    . HYPERTENSION 05/15/2008    Needs BB 2/2 CAD and ACEI 2/2 DM.     [name] Overweight (BMI 25.0-29.9) 03/27/2012  . Tobacco use 03/27/2012    She has tried Chantix in past with intolerable side effects.   [name] CVA 09/23/2009    Left CEA May 2006 at same time as CABG 2010 : Acute left hemisphere infarct of posterior limb internal capsule and lateral thalamus.  Prior CVA's but dates unknown, imaging shows widespread multifocal remote lacunar infarcts throughout the brain and brainstem.  MRA 2010 : no severe proximal flow limiting stenosis.    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 Coronary artery bypass graft  May 2006    Dr Servando Snare  . Abdominal hysterectomy    . Cholecystectomy    . Back surgery      lumbar L3-L5  . Exploratory laparotomy  7/03    Removed the cervix and now has vaginal cuff  . Carotid endarterectomy  May 2006    Left - at same time as CABG. Dr Tenna Delaine.  . Angioplasty / stenting femoral  11/03/2010    B aorto-fem angiogram with artherectomy and stenting of the R SFA 2/2 claudication.  . Cystoscopy  01/2013    Normal. Dr Matilde Sprang    Molli Barrows, RD, LDN, [place] Pager [phone] After Hours Pager [phone]

## 2014-07-18 NOTE — Progress Notes (Signed)
PT evaluation addendum   Pt's daughter arrived after PT evaluation. Per discussion with daughter, daughter has been living with patient for the last week and pt has required assist with ADLs and IADLs. Prior to this, pt had on/off friend assist with ADLs and IADLs. Pt using RW for support. Multiple falls reported PTA. Daughter will be taking patient back to [place] at discharge to live with her and pt will most likely go on hospice. Pt will need hospital bed, BSC and RW for safe discharge. Will need to be able to negotiate 5 steps to get into daughter's home.   Candy Sledge, [place], DPT [phone]

## 2014-07-18 NOTE — Evaluation (Signed)
Physical Therapy Evaluation Patient Details Name: Vanessa Calderon MRN: [id number] DOB: 1946-02-14 Today's Date: 07/18/2014   History of Present Illness  Patient is a 68 y/o female with PMH of CAD s/p CABG, DM, HLD, HTN, CVA with residual R hemiparesis and recently diagnosed R lung mass concerning for malignancy admitted for dysarthria and difficulty ambulating the past 2-3 days. Pt was in the ED 10/9 and was noted to be hypoxic in the 80s and had a CXR done which showed R hilar and perihilar opacities. 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 Discussion about pt wanting to leave AMA and not pursue medical treatment/surgery. Psych consult pending.   Clinical Impression  Patient presents with functional limitations due to deficits listed in PT problem list (see below). Pt with balance deficits, generalized weakness and poor safety awareness limiting safe mobility and putting pt at increased risk for falls. Pt impulsive and unsteady on feet requiring assist for balance. Pt would benefit from acute PT to improve transfers, gait, balance and overall safe mobility so pt can maximize independence and ease burden of care prior to return home. Daughter plans to get mother home to [place] once medically cleared.    Follow Up Recommendations Supervision/Assistance - 24 hour;Other (comment)    Equipment Recommendations  Other (comment)    Recommendations for Other Services OT consult     Precautions / Restrictions Precautions Precautions:  Fall Restrictions Weight Bearing Restrictions: No      Mobility  Bed Mobility Overal bed mobility: Needs Assistance Bed Mobility: Supine to Sit;Sit to Supine     Supine to sit: Supervision;HOB elevated Sit to supine: Supervision;HOB elevated   General bed mobility comments: Use of rails. Supervision due to impulsive behavior.  Transfers Overall transfer level: Needs assistance Equipment used: Rolling walker (2 wheeled) Transfers: Sit to/from Stand Sit to Stand: Min assist;Min guard         General transfer comment: Stood from EOB x3 with Min A for balance/stability as pt with LOB onto bed initially on first standing attempt. Progressed to Min guard for steadying to stand.  Ambulation/Gait Ambulation/Gait assistance: Min assist Ambulation Distance (Feet): 18 Feet Assistive device: Rolling walker (2 wheeled) Gait Pattern/deviations: Step-through pattern;Decreased stride length;Trunk flexed Gait velocity: slow Gait velocity interpretation: Below normal speed for age/gender General Gait Details: Min A to navigate and propel RW forward and for balance. Unsteady on feet. Able to perform marching and side stepping with cues. Impulsive at times.   Stairs            Wheelchair Mobility    Modified Rankin (Stroke Patients Only)       Balance Overall balance assessment: Needs assistance Sitting-balance support: Feet supported Sitting balance-Leahy Scale: Fair     Standing balance support: During functional activity Standing balance-Leahy Scale: Poor Standing balance comment: Requires BUE support on RW for safety/balance.                              Pertinent Vitals/Pain Pain Assessment: No/denies pain    Home Living Family/patient expects to  be discharged to:: Private residence Living Arrangements: Alone   Type of Home: Apartment Home Access: Level entry     Home Layout: [place]: Walker - 2 wheels      Prior Function  Level of Independence: Independent with assistive device(s)         Comments: Not sure of accuracy of reported PLOF. Pt reports using RW for ambulation and (I) with ADLs at baseline. no family members present to corroborate information.     Hand Dominance        Extremity/Trunk Assessment   Upper Extremity Assessment: Defer to OT evaluation           Lower Extremity Assessment: RLE deficits/detail;Generalized weakness RLE Deficits / Details: Generally RLE appears weaker than LLE most likely secondary to previous CVA with residual weakness. Able to stand and ambulate with assist without knee buckling. Grossly ~3-3+/5 throughout.       Communication   Communication:  (Mild slurred speech noteD?)  Cognition Arousal/Alertness: Awake/alert Behavior During Therapy: Impulsive Overall Cognitive Status:  (Pt with impaired safety awareness and decreased awareness/insight into disabilities. High fall risk.)                      General Comments General comments (skin integrity, edema, etc.): Pt incontinent of urine upon PT entering room. Performed toilet hygiene and changed pads.    Exercises        Assessment/Plan    PT Assessment Patient needs continued PT services  PT Diagnosis Abnormality of gait;Generalized weakness   PT Problem List Decreased strength;Decreased activity tolerance;Decreased knowledge of use of DME;Decreased balance;Decreased safety awareness;Decreased mobility  PT Treatment Interventions Balance training;Gait training;Neuromuscular re-education;DME instruction;Cognitive remediation;Patient/family education;Functional mobility training;Therapeutic activities;Therapeutic exercise   PT Goals (Current goals can be found in the Care Plan section) Acute Rehab PT Goals Patient Stated Goal: "I want to go home" PT Goal Formulation: With patient Time For Goal Achievement: 08/01/14 Potential to Achieve Goals: Fair    Frequency Min 4X/week   Barriers  to discharge Decreased caregiver support Pt lives alone, per pt report.    Co-evaluation               End of Session Equipment Utilized During Treatment: Gait belt Activity Tolerance: Patient limited by fatigue Patient left: in bed;with call bell/phone within reach;with bed alarm set Nurse Communication: Mobility status;Precautions         Time: 1433-1450 PT Time Calculation (min): 17 min   Charges:   PT Evaluation $Initial PT Evaluation Tier I: 1 Procedure PT Treatments $Gait Training: 8-22 mins   PT G CodesCandy Sledge A 07/18/2014, 3:18 PM Candy Sledge, PT, DPT [phone]

## 2014-07-18 NOTE — Evaluation (Signed)
Clinical/Bedside Swallow Evaluation Patient Details  Name: Vanessa Calderon MRN: [id number] Date of Birth: Oct 13, 1945  Today's Date: 07/18/2014 Time: 1142-1202 SLP Time Calculation (min): 20 min  Past Medical History:  Past Medical History  Diagnosis Date  . CORONARY ARTERY DISEASE 05/15/2008    CABG 2006, DES to the saphenous vein graft to the ramus in December of 2006.  ECHO 2010 : basal inferior wall akinesis. Post-op septal wall dyssynchrony. Grade 1diastolic dysfunction.       [name] DIABETES MELLITUS, TYPE II 05/15/2008    Well controlled on oral meds.    [name] HYPERLIPIDEMIA 05/11/2008    Mgmt with statin    . HYPERTENSION 05/15/2008    Needs BB 2/2 CAD and ACEI 2/2 DM.     [name] Overweight (BMI 25.0-29.9) 03/27/2012  . Tobacco use 03/27/2012    She has tried Chantix in past with intolerable side effects.   [name] CVA 09/23/2009    Left CEA May 2006 at same time as CABG 2010 : Acute left hemisphere infarct of posterior limb internal capsule and lateral thalamus.  Prior CVA's but dates unknown, imaging shows widespread multifocal remote lacunar infarcts throughout the brain and brainstem.  MRA 2010 : no severe proximal flow limiting stenosis.    Residual right arm and leg hemiparesis after CVA   Past Surgical History:  Past Surgical History  Procedure Laterality Date  . Coronary artery bypass graft  May 2006    Dr Servando Snare  . Abdominal hysterectomy    . Cholecystectomy    . Back surgery      lumbar L3-L5  . Exploratory laparotomy  7/03    Removed the cervix and now has vaginal cuff  . Carotid endarterectomy  May 2006    Left - at same time as CABG. Dr Tenna Delaine.  . Angioplasty / stenting femoral  11/03/2010    B aorto-fem angiogram with artherectomy and stenting of the R SFA 2/2 claudication.  . Cystoscopy  01/2013    Normal. Dr Matilde Sprang   HPI:  Patient is a 68 year old female who was recently diagnosed with lung cancer last week, and was brought to the emergency department with dysarthria  and balance difficulty. Workup with CT scan showed a large right cerebellar mass concerning for metastatic disease.   Assessment / Plan / Recommendation Clinical Impression  Pt presents with apraxic-like movements impacting her oral phase of swallow, with suspected delayed pharyngeal initiation as well. While she does not present with overt signs of aspiraion at bedside, she is impulsive with intake, requiring Mod cues from SLP to initiate posterior transit of bolus prior to taking in more PO. Recommend to initiate Dys 3 diet and thin liquids with full supervision for impulsivity to increase safety and decrease aspiration risk. Will follow for tolerance.    Aspiration Risk  Moderate    Diet Recommendation Dysphagia 3 (Mechanical Soft);Thin liquid   Liquid Administration via: Cup;Straw Medication Administration: Whole meds with puree Supervision: Patient able to self feed;Full supervision/cueing for compensatory strategies Compensations: Slow rate;Small sips/bites (check for oral holding) Postural Changes and/or Swallow Maneuvers: Seated upright 90 degrees    Other  Recommendations Oral Care Recommendations: Oral care BID   Follow Up Recommendations  Other (comment) (TBD)    Frequency and Duration min 2x/week  2 weeks   Pertinent Vitals/Pain n/a    SLP Swallow Goals     Swallow Study Prior Functional Status       General Date of Onset: 07/17/14 HPI: Patient is a 68 year old  female who was recently diagnosed with lung cancer last week, and was brought to the emergency department with dysarthria and balance difficulty. Workup with CT scan showed a large right cerebellar mass concerning for metastatic disease. Type of Study: Bedside swallow evaluation Previous Swallow Assessment: none in chart Diet Prior to this Study: NPO Temperature Spikes Noted: No Respiratory Status: Room air History of Recent Intubation: No Behavior/Cognition: Alert;Cooperative;Pleasant  mood;Impulsive Oral Cavity - Dentition: Adequate natural dentition Self-Feeding Abilities: Able to feed self Patient Positioning: Upright in bed Baseline Vocal Quality: Hoarse;Other (comment) (strained) Volitional Cough: Weak Volitional Swallow: Unable to elicit    Oral/Motor/Sensory Function Overall Oral Motor/Sensory Function: Impaired Labial ROM: Within Functional Limits Labial Symmetry: Within Functional Limits Labial Strength: Reduced Labial Sensation: Reduced Lingual ROM: Within Functional Limits Lingual Symmetry: Within Functional Limits Lingual Strength: Reduced Facial ROM: Within Functional Limits Facial Symmetry: Within Functional Limits Facial Strength: Within Functional Limits Velum: Within Functional Limits Mandible: Within Functional Limits   Ice Chips Ice chips: Within functional limits Presentation: Spoon;Self Fed Other Comments: impulsive   Thin Liquid Thin Liquid: Impaired Presentation: Cup;Self Fed;Straw Oral Phase Functional Implications: Oral holding Pharyngeal  Phase Impairments: Suspected delayed Swallow Other Comments: impulsive    Nectar Thick Nectar Thick Liquid: Not tested   Honey Thick Honey Thick Liquid: Not tested   Puree Puree: Impaired Presentation: Self Fed;Spoon Oral Phase Functional Implications: Oral holding Pharyngeal Phase Impairments: Suspected delayed Swallow Other Comments: impulsive   Solid   GO    Solid: Impaired Presentation: Self Fed Oral Phase Impairments: Impaired mastication Oral Phase Functional Implications: Oral holding Other Comments: impulsive        Vanessa Calderon, M.A. CCC-SLP [phone]  Vanessa Calderon 07/18/2014,12:19 PM

## 2014-07-18 NOTE — Progress Notes (Signed)
Patient ID: Vanessa Calderon, female   DOB: April 04, 1946, 68 y.o.   MRN: [id number] Patient without complaints except perseverative now wanting something to drink. Neurologically she is stable still with some dysarthria but otherwise nonfocal. I had an extensive and very frank discussion with the patient and her family regarding the fact that she does have what appears to be metastatic lung cancer with a large metastatic lesion to the brain has some evidence of hemorrhage in it that if she left the hospital or refuse surgery that more than likely this will progress cause cerebral edema or rehemorrhage and ultimately result in her death. I explained it is very bluntly and frankly to the patient and her family she did not have any interest in pursuing any surgical correction and her only interest was getting something to eat and drink and leaving the hospital. I explained to her that this would be [place] and not in her best interest she appeared to understand this. In the grand scheme of things certainly surgery on the right cerebellar mat would not be a curative operation she still has metastatic cancer and would die from this. I certainly think it could improve her quality of life and make the cancer possibly more treatable however the patient apparently has expressed to her family that she really has no desire to continue on living. This does raise a question of her competency and I'll defer to the medical physicians as to whether it's worth having a psychiatric consult for this. Certainly it may be not in her best interest to proceed forward with a surgery against her wishes regardless of whether she is competent. I'm available to keep following her as long as she is in the hospital I will defer initiating her diet until after medicine and/or speech therapy clear her swallowing. But we will not plan any surgery so therefore she does not need to remain nothing by mouth from my perspective.

## 2014-07-19 DIAGNOSIS — F329 Major depressive disorder, single episode, unspecified: Secondary | ICD-10-CM | POA: Insufficient documentation

## 2014-07-19 DIAGNOSIS — R410 Disorientation, unspecified: Secondary | ICD-10-CM | POA: Insufficient documentation

## 2014-07-19 DIAGNOSIS — F32A Depression, unspecified: Secondary | ICD-10-CM | POA: Insufficient documentation

## 2014-07-19 DIAGNOSIS — F0630 Mood disorder due to known physiological condition, unspecified: Secondary | ICD-10-CM | POA: Insufficient documentation

## 2014-07-19 LAB — GLUCOSE, CAPILLARY
GLUCOSE-CAPILLARY: 419 mg/dL — AB (ref 70–99)
GLUCOSE-CAPILLARY: 369 mg/dL — AB (ref 70–99)
GLUCOSE-CAPILLARY: 136 mg/dL — AB (ref 70–99)
Glucose-Capillary: 430 mg/dL — ABNORMAL HIGH (ref 70–99)

## 2014-07-19 MED ORDER — ROSUVASTATIN CALCIUM 10 MG PO TABS
10.0000 mg | ORAL_TABLET | Freq: Every evening | ORAL | Status: DC
  Administered 2014-07-19 – 2014-07-23 (×4): 10 mg via ORAL
  Filled 2014-07-19 (×6): qty 1

## 2014-07-19 MED ORDER — NICOTINE 14 MG/24HR TD PT24
14.0000 mg | MEDICATED_PATCH | Freq: Every day | TRANSDERMAL | Status: DC
  Administered 2014-07-19 – 2014-07-22 (×4): 14 mg via TRANSDERMAL
  Filled 2014-07-19 (×5): qty 1

## 2014-07-19 MED ORDER — INSULIN ASPART 100 UNIT/ML ~~LOC~~ SOLN
0.0000 [IU] | Freq: Three times a day (TID) | SUBCUTANEOUS | Status: DC
  Administered 2014-07-19: 20 [IU] via SUBCUTANEOUS
  Administered 2014-07-19: 3 [IU] via SUBCUTANEOUS
  Administered 2014-07-20: 22 [IU] via SUBCUTANEOUS
  Administered 2014-07-20: 4 [IU] via SUBCUTANEOUS
  Administered 2014-07-20: 15 [IU] via SUBCUTANEOUS
  Administered 2014-07-21: 11 [IU] via SUBCUTANEOUS
  Administered 2014-07-21: 15 [IU] via SUBCUTANEOUS
  Administered 2014-07-22: 20 [IU] via SUBCUTANEOUS
  Administered 2014-07-22: 7 [IU] via SUBCUTANEOUS
  Administered 2014-07-22: 20 [IU] via SUBCUTANEOUS
  Administered 2014-07-23: 26 [IU] via SUBCUTANEOUS
  Administered 2014-07-23: 11 [IU] via SUBCUTANEOUS

## 2014-07-19 MED ORDER — DEXAMETHASONE SODIUM PHOSPHATE 10 MG/ML IJ SOLN
6.0000 mg | Freq: Four times a day (QID) | INTRAMUSCULAR | Status: DC
  Administered 2014-07-19 – 2014-07-20 (×4): 6 mg via INTRAVENOUS
  Filled 2014-07-19 (×4): qty 1

## 2014-07-19 NOTE — Plan of Care (Signed)
Problem: Consults Goal: Skin Care Protocol Initiated - if Braden Score 18 or less If consults are not indicated, leave blank or document N/A Outcome: Completed/Met Date Met:  07/19/14     

## 2014-07-19 NOTE — Progress Notes (Addendum)
Subjective: Pt has no complaints, only requesting to have water.   Pt given 0.5mg  of haldol this am during rounds b/c she was trying to get out of bed to get water despite NPO orders.   Spoke w/ daughter Ruthie yesterday who is next in kin to patient ( husband died, only has one living child - Ruthie) who would like to proceed w/ lung biopsy, however during rounds spoke w/ Dr. Saintclair Halsted from neurosurgery who will proceed w/ rt cerebellar mass resections w/o biopsy. Ruthie ultimately wants to take pt back home to [place] with her. SW consulted regarding transportation issues back to [place] but states that she will have to transport mother herself.   Objective: Vital signs in last 24 hours: Filed Vitals:   07/19/14 0230 07/19/14 0700 07/19/14 0900 07/19/14 1356  BP: 141/65 136/66 131/68 134/59  Pulse: 87 104 100 89  Temp: 98.1 F (36.7 C) 97.7 F (36.5 C) 98.6 F (37 C) 98.1 F (36.7 C)  TempSrc: Oral Oral Oral Oral  Resp: 18 22 20 20   Height:      Weight:      SpO2: 95% 98% 100% 98%   PE General: NAD, dysarthric speech but clear and intelligible HEENT: moist mucous membranes Lungs: CTAB Cardiac: RRR, no murmurs Neuro: CN2-12 grossly intact  Lab Results: Basic Metabolic Panel:  Recent Labs Lab 07/17/14 1433 07/18/14 0735  NA 139 145  K 4.3 4.4  CL 101 108  CO2 25 26  GLUCOSE 304* 181*  BUN 11 11  CREATININE 0.92 0.80  CALCIUM 9.5 9.9   Liver Function Tests:  Recent Labs Lab 07/17/14 1433  AST 19  ALT 17  ALKPHOS 129*  BILITOT 0.3  PROT 7.7  ALBUMIN 3.3*   CBC:  Recent Labs Lab 07/17/14 1433 07/18/14 0735  WBC 10.8* 11.5*  NEUTROABS 7.6  --   HGB 11.5* 12.7  HCT 34.9* 39.2  MCV 94.3 96.1  PLT 325 370   CBG:  Recent Labs Lab 07/18/14 1243 07/18/14 1624 07/18/14 2018 07/18/14 2351 07/19/14 0840 07/19/14 1212  GLUCAP 236* 345* 265* 256* 419* 369*   Coagulation:  Recent Labs Lab 07/17/14 1433  LABPROT 15.2  INR 1.18    Studies/Results: Mr Jeri Cos ZO Contrast  07/17/2014   CLINICAL DATA:  Lung cancer.  Abnormal head CT.  EXAM: MRI HEAD WITHOUT AND WITH CONTRAST  TECHNIQUE: Multiplanar, multiecho pulse sequences of the brain and surrounding structures were obtained without and with intravenous contrast.  CONTRAST:  2mL MULTIHANCE GADOBENATE DIMEGLUMINE 529 MG/ML IV SOLN  COMPARISON:  CT head 07/17/2014  FINDINGS: Large hemorrhagic mass in the right lateral cerebellum compatible with metastatic disease. This measures approximately 4.2 x 3.6 cm. There is edema with mass effect on the fourth ventricle which is displaced to the left. High-density on CT corresponds to significant hemorrhage within the tumor.  Hemorrhagic tumor in the left medial frontal lobe. This shows mild enhancement but no significant edema or mass effect.  Metastatic disease in the posterior sella. The sella is chronically enlarged however there is now enhancing soft tissue mass dorsally in the sella measuring 7 x 14 mm. This may be bony metastasis in the dorsum sella or may be within the infundibulum or pituitary.  Chronic hemorrhagic infarcts in the basal ganglia bilaterally. Chronic ischemia in the pons and left cerebellum. Chronic microvascular ischemia in the cerebral white matter. No acute infarct.  IMPRESSION: Large hemorrhagic metastatic deposit in the right lateral cerebellum with edema and mass-effect  on the fourth ventricle. No hydrocephalus.  Hemorrhagic metastatic deposit approximately 12 mm left medial frontal lobe without significant edema.  Metastatic disease in the dorsal sella which may be metastatic disease to the infundibulum and pituitary or possibly bony metastatic disease in the dorsum sella.  Chronic ischemic changes as above.  No acute infarct.   Electronically Signed   By: Franchot Gallo M.D.   On: 07/17/2014 21:06   Medications: I have reviewed the patient's current medications. Scheduled Meds: . dexamethasone  6 mg Intravenous  4 times per day  . insulin aspart  0-20 Units Subcutaneous TID WC  . nicotine  14 mg Transdermal Daily  . rosuvastatin  10 mg Oral QPM  . sodium chloride  3 mL Intravenous Q12H   Continuous Infusions:  PRN Meds:. Assessment/Plan: Principal Problem:   Brain metastasis Active Problems:   Diabetes mellitus type 2 in obese   HLD (hyperlipidemia)   Essential hypertension   Coronary atherosclerosis   Tobacco use   Mass of middle lobe of right lung   Metastatic cancer to brain   Acute delirium   Depression   Mood disorder due to a general medical condition  Metastatic lung cancer with brain metastases: Diagnosed- 06/26/2014, on chest CT- presumed to be non-small cell bronchogenic carcinoma, compatible with at least stage IIIB disease With MRi findings of evidence of mets and mass effect. Pt seen by psych and determined that she does not have capacity for medical decision making.  - Pt received 4 doses of Decadron 6mg  Q6hr, will transition to 4mg  q6h per neurosx note - Up with assistance  - IR saw patient and would be able to take pt to u/s for rt supraclavicular lymph node or rt adrenal gland tissue biopsy, however Dr. Saintclair Halsted from neurosx will take pt to OR around Wednesday for rt cerebellar mass removal. Dr. Saintclair Halsted will see patient later this evening.  - LDH pending  DM2: A1c 7.6 on 05/21/14. She is on Metformin 500mg  BID at home, which was held on admission.  -SSI resistant 2/2 to steroid use  HTN: Stable, normotensive during admission. Pt on Toprol XL 25mg  daily and lisinopril 10mg  daily at home.  - holding home meds  HLD: On Crestor 10mg  QPM at home  - continued home med  Urinary incontinence: Evaluated by urology 01/2013. Mixed stress and urge incontinence/overactive bladder and a neurogenic bladder with a functional component. She is on fesoterodine 8mg  daily  -Home fesoterodine 8mg  daily held   FEN:  -resume dysphagia 3, thin liquids diet   DVT prophylaxis: SCDs  Dispo:  Disposition deferred until Wed after rt cerebellar mass removal by Dr. Saintclair Halsted  The patient does have a current PCP Bartholomew Crews, MD) and does need an [facility] hospital follow-up appointment after discharge.  The patient does not have transportation limitations that hinder transportation to clinic appointments.  .Services Needed at time of discharge: Y = Yes, Blank = No PT: SNF  OT: 3 in 1 bedside comode, hospital bed  RN:   Equipment:   Other:     LOS: 2 days   Julious Oka, MD 07/19/2014, 4:10 PM

## 2014-07-19 NOTE — Progress Notes (Signed)
Subjective: Pt sitting up eating breakfast this morning. Pt able to state that she has lung cancer and a tumor in her brain. She knows that if she does not have surgery she will die. When asked what happens when you die she states you go to sleep. Pt states she wants to go home and sleep forever. Pt states she does not want surgery and just wants to go home.   Objective: Vital signs in last 24 hours: Filed Vitals:   07/18/14 1745 07/18/14 2152 07/19/14 0230 07/19/14 0700  BP: 107/71 136/54 141/65 136/66  Pulse: 90 93 87 104  Temp: 98.2 F (36.8 C) 98.8 F (37.1 C) 98.1 F (36.7 C) 97.7 F (36.5 C)  TempSrc: Oral Oral Oral Oral  Resp: 18 20 18 22   Height:      Weight:      SpO2: 97% 97% 95% 98%   PE General: NAD, dysarthric speech but clear and intelligible HEENT: moist mucous membranes, symmetric smile, unable to puff out cheeks Lungs: CTAB Cardiac: RRR, no murmurs Neuro: negative pronator drift, AAOx 3  Lab Results: Basic Metabolic Panel:  Recent Labs Lab 07/17/14 1433 07/18/14 0735  NA 139 145  K 4.3 4.4  CL 101 108  CO2 25 26  GLUCOSE 304* 181*  BUN 11 11  CREATININE 0.92 0.80  CALCIUM 9.5 9.9   Liver Function Tests:  Recent Labs Lab 07/17/14 1433  AST 19  ALT 17  ALKPHOS 129*  BILITOT 0.3  PROT 7.7  ALBUMIN 3.3*   CBC:  Recent Labs Lab 07/17/14 1433 07/18/14 0735  WBC 10.8* 11.5*  NEUTROABS 7.6  --   HGB 11.5* 12.7  HCT 34.9* 39.2  MCV 94.3 96.1  PLT 325 370   CBG:  Recent Labs Lab 07/18/14 0440 07/18/14 0851 07/18/14 1243 07/18/14 1624 07/18/14 2018 07/18/14 2351  GLUCAP 154* 196* 236* 345* 265* 256*   Coagulation:  Recent Labs Lab 07/17/14 1433  LABPROT 15.2  INR 1.18   Studies/Results: Ct Head (brain) Wo Contrast  07/17/2014   CLINICAL DATA:  Fall, speech difficulty, aphasia, worsening over the past week.  EXAM: CT HEAD WITHOUT CONTRAST  TECHNIQUE: Contiguous axial images were obtained from the base of the skull  through the vertex without contrast.  COMPARISON:  04/07/2010  FINDINGS: In the posterior fossa, there is a 3.7 x 3.9 cm heterogeneous hyperdense intra-axial mass centered in the right cerebellum. There is adjacent vasogenic edema and mass effect with displacement of the fourth ventricle to the left. 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  Age related background atrophy noted. Chronic white matter microvascular ischemic change throughout the cerebral white matter about the ventricles. Mild ventricular enlargement without definite hydrocephalus. Remote bilateral basal ganglia lacunar-type infarcts. Slight mass effect upon the right basilar cistern. No extra-axial fluid collection.  Mastoids and sinuses remain clear.  IMPRESSION: 3.7 x 3.9 cm heterogeneous hyperdense right cerebellar intra-axial mass with surrounding edema and adjacent mass effect as described. Suspect intracranial metastasis given the chest CT findings from 07/09/2014. Recommend further evaluation with brain MRI without and with contrast.  These results were called by telephone at the time of interpretation on 07/17/2014 at 3:27 pm to Dr. Daleen Bo, who verbally acknowledged these results.   Electronically Signed   By: Daryll Brod M.D.   On: 07/17/2014 15:32   Mr Jeri Cos WJ Contrast  07/17/2014   CLINICAL DATA:  Lung cancer.  Abnormal  head CT.  EXAM: MRI HEAD WITHOUT AND WITH CONTRAST  TECHNIQUE: Multiplanar, multiecho pulse sequences of the brain and surrounding structures were obtained without and with intravenous contrast.  CONTRAST:  66mL MULTIHANCE GADOBENATE DIMEGLUMINE 529 MG/ML IV SOLN  COMPARISON:  CT head 07/17/2014  FINDINGS: Large hemorrhagic mass in the right lateral cerebellum compatible with metastatic disease. This measures approximately 4.2 x 3.6 cm. There is edema with mass effect on the fourth ventricle which is displaced to the left. High-density on  CT corresponds to significant hemorrhage within the tumor.  Hemorrhagic tumor in the left medial frontal lobe. This shows mild enhancement but no significant edema or mass effect.  Metastatic disease in the posterior sella. The sella is chronically enlarged however there is now enhancing soft tissue mass dorsally in the sella measuring 7 x 14 mm. This may be bony metastasis in the dorsum sella or may be within the infundibulum or pituitary.  Chronic hemorrhagic infarcts in the basal ganglia bilaterally. Chronic ischemia in the pons and left cerebellum. Chronic microvascular ischemia in the cerebral white matter. No acute infarct.  IMPRESSION: Large hemorrhagic metastatic deposit in the right lateral cerebellum with edema and mass-effect on the fourth ventricle. No hydrocephalus.  Hemorrhagic metastatic deposit approximately 12 mm left medial frontal lobe without significant edema.  Metastatic disease in the dorsal sella which may be metastatic disease to the infundibulum and pituitary or possibly bony metastatic disease in the dorsum sella.  Chronic ischemic changes as above.  No acute infarct.   Electronically Signed   By: Franchot Gallo M.D.   On: 07/17/2014 21:06   Medications: I have reviewed the patient's current medications. Scheduled Meds: . dexamethasone  10 mg Intravenous 4 times per day  . insulin aspart  0-15 Units Subcutaneous 6 times per day  . sodium chloride  3 mL Intravenous Q12H   Continuous Infusions:  PRN Meds:. Assessment/Plan: Principal Problem:   Brain metastasis Active Problems:   Diabetes mellitus type 2 in obese   HLD (hyperlipidemia)   Essential hypertension   Coronary atherosclerosis   Tobacco use   Mass of middle lobe of right lung   Metastatic cancer to brain  Metastatic lung cancer with brain metastases: Diagnosed- 06/26/2014, on chest CT- presumed to be non-small cell bronchogenic carcinoma, compatible with at least stage IIIB disease With MRi findings of- with  evidence of mets and mass effect. Neurosurgery was consulted- Pt refusing further procedures, but think surgery might improve quality of her life.  - Pt received 5 doses of Decadron 10mg , transitioned to 6mg  Q6hr for another 4 doses then 4mg  - Up with assistance  - OT eval pending - IR consult when pt capacity has been determined. - Psych Consulted, Dr. Adele Schilder will see patient around 2-2:30pm today for evaluation of capacity  DM2: A1c 7.6 on 05/21/14. She is on Metformin 500mg  BID at home, which was held on admission.  -SSI moderate changed to resistant 2/2 to steroid use  HTN: Stable. Pt on Toprol XL 25mg  daily and lisinopril 10mg  daily at home. BP well controlled on admission, running 120s/50s. Will hold her home meds at this time, which will likely need to be restarted over the next few days.   HLD: On Crestor 10mg  QPM at home  - continued home med  Urinary incontinence: Evaluated by urology 01/2013. Mixed stress and urge incontinence/overactive bladder and a neurogenic bladder with a functional component. She is on fesoterodine 8mg  daily  -Home fesoterodine 8mg  daily held   FEN:  -  dysphagia 3, thin liquids diet  DVT prophylaxis: SCDs  Dispo: Disposition is deferred at this time, awaiting psych evaluation for capacity. If determined that pt has capacity she may leave AMA.  The patient does have a current PCP Bartholomew Crews, MD) and does need an [facility] hospital follow-up appointment after discharge.  The patient does not have transportation limitations that hinder transportation to clinic appointments.  .Services Needed at time of discharge: Y = Yes, Blank = No PT: SNF  OT:   RN:   Equipment:   Other:     LOS: 2 days   Julious Oka, MD 07/19/2014, 7:24 AM

## 2014-07-19 NOTE — Progress Notes (Signed)
Patient ID: Vanessa Calderon, female   DOB: 1946/08/24, 68 y.o.   MRN: [id number] Patient neurologically unchanged awake alert oriented no pronator drift she still stating that she wants to go home she has not her since in the hospital she's noticed in pursuing surgery she does remember the conversations that I had with her yesterday regarding the risks and the possibility of her going home and dying and that's what she is choosing as opposed to any treatment for her presumptive metastatic carcinoma. I do think that if she signs out [place] that she will need some sort of hospice care and she will not be independent at home nor do I think she has enough of a sports structure, take care of her.

## 2014-07-19 NOTE — Progress Notes (Signed)
Occupational Therapy Evaluation Patient Details Name: Vanessa Calderon MRN: [id number] DOB: 1946/07/06 Today's Date: 07/19/2014    History of Present Illness Patient is a 68 y/o female with PMH of CAD s/p CABG, DM, HLD, HTN, CVA with residual R hemiparesis and recently diagnosed R lung mass concerning for malignancy admitted for dysarthria and difficulty ambulating the past 2-3 days. Pt was in the ED 10/9 and was noted to be hypoxic in the 80s and had a CXR done which showed R hilar and perihilar opacities. 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 Discussion about pt wanting to leave AMA and not pursue medical treatment/surgery. Psych consult on 07/19/14 noted that pt has limited insight and cannot participate in treatment planning.   Clinical Impression   PTA pt lived at home alone but required assistance from friend and/or daughter (visiting from [place]) for ADLs, especially over last 2 weeks. Pt's daughter plans to take the pt back home to [place] with her and will likely get setup with Hospice once home. Pt currently requires assist for ADLs due to decreased cognition and weakness which limit her independence. Pt will benefit from acute OT to address cognition and safe transfers, as well as ADLs.      Follow Up Recommendations  Supervision/Assistance - 24 hour;Other (comment) (pt's daughter plans to take her back home with her to [place])    Equipment Recommendations  3 in 1 bedside comode;Hospital bed;Other (comment) (RW)    Recommendations for Other Services        Precautions / Restrictions Precautions Precautions: Fall Restrictions Weight Bearing Restrictions: No      Mobility Bed Mobility Overal bed mobility: Needs Assistance Bed Mobility: Supine to Sit;Sit to Supine     Supine to sit: Supervision;HOB elevated Sit to supine: Supervision   General bed mobility comments: Use of rails. Supervision due to impulsive behavior.  Transfers Overall transfer level: Needs assistance Equipment used: Rolling walker (2 wheeled) Transfers: Sit to/from Stand Sit to Stand: Min guard         General transfer comment: Initially Min (A) however progressed to min guard for steadying and no physical assist. Pt does require physical assist for dynamic balance.     Balance Overall balance assessment: Needs assistance Sitting-balance support: No upper extremity supported;Feet supported Sitting balance-Leahy Scale: Fair     Standing balance support: Bilateral upper extremity supported;During functional activity Standing balance-Leahy Scale: Poor Standing balance comment: Requires BUE support on RW for safety/balance                            ADL Overall ADL's : Needs assistance/impaired Eating/Feeding: Set up;Sitting;Supervision/ safety Eating/Feeding Details (indicate cue type and reason): pt requires supervision and VC's for oral holding and moderating intake. Pt observed to cough after taking sips of water, but may have taken too much at one time.  Grooming: Min guard;Standing;Cueing for safety;Cueing for sequencing   Upper Body Bathing: Minimal assitance;Sitting;Cueing for safety;Cueing for sequencing   Lower Body Bathing: Minimal assistance;Sit to/from stand;Cueing for sequencing;Cueing for safety   Upper Body Dressing :  Minimal assistance;Sitting;Cueing for sequencing;Cueing for safety   Lower Body Dressing: Moderate assistance;Cueing for sequencing;Cueing for safety;Sit to/from stand   Toilet Transfer: Minimal  assistance;RW;Ambulation;Grab bars (BSC over toilet)   Toileting- Clothing Manipulation and Hygiene: Min guard;Sit to/from stand Toileting - Clothing Manipulation Details (indicate cue type and reason): pt was able to appropriately clean self with wash cloth and toilet paper. Pt has been wearing adult diapers due to incontinence or lack of awareness of need for toileting.      Functional mobility during ADLs: Minimal assistance;Rolling walker General ADL Comments: Pt stated that she wanted to go home, however easily redirected by offering to walk and pt was eager to walk in hallway. After ambulating, pt sat on toilet for toileting and performed hand washing at the sink and was returned to bed. Safety with ADLs discussed with pt's daughter who reports that she plans to take her mother home to [place]. Pt's daughter plans to obtain [facility] or PCA to assist with caring for her mother as well as getting set up with Hospice.     Vision                 Additional Comments: Difficult to assess due to cognition. Pt reports no change from baseline but difficult to know accuracy of this report.    Perception Perception Perception Tested?: No   Praxis Praxis Praxis tested?: Not tested    Pertinent Vitals/Pain Pain Assessment: No/denies pain     Hand Dominance     Extremity/Trunk Assessment Upper Extremity Assessment Upper Extremity Assessment: Generalized weakness   Lower Extremity Assessment Lower Extremity Assessment: Defer to PT evaluation   Cervical / Trunk Assessment Cervical / Trunk Assessment: Kyphotic   Communication Communication Communication: No difficulties   Cognition Arousal/Alertness: Awake/alert Behavior During Therapy: Impulsive Overall Cognitive Status: Impaired/Different from baseline Area of Impairment: Attention;Memory;Following commands;Safety/judgement;Awareness;Problem solving   Current Attention Level: Sustained Memory: Decreased short-term  memory Following Commands: Follows one step commands consistently;Follows one step commands with increased time Safety/Judgement: Decreased awareness of safety;Decreased awareness of deficits Awareness: Intellectual Problem Solving: Slow processing;Difficulty sequencing;Requires verbal cues;Requires tactile cues                Home Living Family/patient expects to be discharged to:: Private residence Living Arrangements: Children (daughter plans to take pt home with her to [place]) Available Help at Discharge: Family;Available PRN/intermittently (planning to get aide) Type of Home: House Home Access: Stairs to enter [employer] of Steps: 5   Home Layout: One level     Bathroom Shower/Tub: Tub/shower unit (claw foot tub) Shower/tub characteristics: Architectural technologist: Standard     Home Equipment: Environmental consultant - 4 wheels          Prior Functioning/Environment Level of Independence: Needs assistance  Gait / Transfers Assistance Needed: Pt was using rollator to walk at home per daughter's report ADL's / Homemaking Assistance Needed: Daughter reports that pt has required assistance for bathing and dressing for several weeks with increasing assistance needed over the past 2 weeks.         OT Diagnosis: Generalized weakness;Cognitive deficits   OT Problem List: Decreased strength;Decreased activity tolerance;Impaired balance (sitting and/or standing);Decreased cognition;Decreased safety awareness;Decreased knowledge of use of DME or AE   OT Treatment/Interventions: Self-care/ADL training;Therapeutic exercise;Energy conservation;DME and/or AE instruction;Therapeutic activities;Patient/family education;Balance training;Cognitive remediation/compensation;Neuromuscular education    OT Goals(Current goals can be found in the care plan section) Acute Rehab OT Goals Patient Stated Goal: To take her home to [place] with me  OT Goal Formulation: With patient/family Time For  Goal Achievement: 08/02/14 Potential to Achieve Goals: Fair ADL Goals Pt Will Perform Upper Body Bathing: with set-up;with supervision;sitting Pt Will Perform Upper Body Dressing: with set-up;with supervision;sitting Pt Will Transfer to Toilet: with supervision;ambulating;bedside commode  OT Frequency: Min 1X/week    End of Session Equipment Utilized During Treatment: Gait belt;Rolling walker Nurse Communication: Mobility status  Activity Tolerance: Patient tolerated treatment well Patient left: in bed;with call bell/phone within reach;with bed alarm set;with family/visitor present   Time: 2641-5830 OT Time Calculation (min): 36 min Charges:  OT General Charges $OT Visit: 1 Procedure OT Evaluation $Initial OT Evaluation Tier I: 1 Procedure OT Treatments $Self Care/Home Management : 8-22 mins $Therapeutic Activity: 8-22 mins  Juluis Rainier 07/19/2014, 4:24 PM  Cyndie Chime, OTR/L Occupational Therapist [phone] (pager)

## 2014-07-19 NOTE — Consult Note (Signed)
Vanessa Calderon Psychiatry Consult   Reason for Consult:  Capacity evaluation Referring Physician:  Dr Bevelyn Buckles is an 68 y.o. female. Total Time spent with Vanessa Calderon: 30 minutes  Assessment: AXIS I:  delirium, depression due to general medical condition AXIS II:  Deferred AXIS III:   Past Medical History  Diagnosis Date  . CORONARY ARTERY DISEASE 05/15/2008    CABG 2006, DES to the saphenous vein graft to the ramus in December of 2006.  ECHO 2010 : basal inferior wall akinesis. Post-op septal wall dyssynchrony. Grade 1diastolic dysfunction.       [name] DIABETES MELLITUS, TYPE II 05/15/2008    Well controlled on oral meds.    [name] HYPERLIPIDEMIA 05/11/2008    Mgmt with statin    . HYPERTENSION 05/15/2008    Needs BB 2/2 CAD and ACEI 2/2 DM.     [name] Overweight (BMI 25.0-29.9) 03/27/2012  . Tobacco use 03/27/2012    She has tried Chantix in past with intolerable side effects.   [name] CVA 09/23/2009    Left CEA May 2006 at same time as CABG 2010 : Acute left hemisphere infarct of posterior limb internal capsule and lateral thalamus.  Prior CVA's but dates unknown, imaging shows widespread multifocal remote lacunar infarcts throughout the brain and brainstem.  MRA 2010 : no severe proximal flow limiting stenosis.    Residual right arm and leg hemiparesis after CVA   AXIS IV:  other psychosocial or environmental problems and problems related to social environment AXIS V:  31-40 impairment in reality testing  Plan:  Supportive therapy provided about ongoing stressors. Vanessa Calderon does not have capacity to participate in her treatment plan.  Subjective:   Vanessa Calderon is a 68 y.o. female Vanessa Calderon admitted with dysarthria and gait abnormality.  HPI:  Vanessa Calderon seen chart reviewed.  Vanessa Calderon is 68 year old African-American female who was diagnosed recently with right lung mass concerning of cancer.  She presented with dysarthria and gait abnormality.  Consult was called as Vanessa Calderon likes to the  discharge.  Vanessa Calderon appears at time confused and labile.  She knows that she has cancer but she is unable to provide more details.  She is refusing test and workup.  She did not cooperate very well and with every question she said I want to go home.  I spoke to her daughter who is sitting next to her, her daughter mentioned that she has history of depression however she take herself off from Prozac.  Her daughter endorsed that lately she's been more isolated, withdrawn and depressed.  Her daughter endorsed that she wants to go home so she can smoke.  Vanessa Calderon appears irritable, labile and difficult to comprehend.  She has difficulty remembering things.  Her daughter mentioned that she is keep changing her plan for doing biopsy to refusing biopsy.  1 minute she is impulsive and wants to go home in [place] she is more calm.  The Vanessa Calderon denies any hallucination, paranoia, active or passive suicidal thoughts or homicidal thoughts however she appears incoherent with thought blocking and labile mood.  She is alert and oriented 2.  She do not remember having history of depression or taking any psychotropic medication even though her daughter mentioned that she has taken Prozac in the past.   Past Psychiatric History: Past Medical History  Diagnosis Date  . CORONARY ARTERY DISEASE 05/15/2008    CABG 2006, DES to the saphenous vein graft to the ramus in December of 2006.  ECHO 2010 :  basal inferior wall akinesis. Post-op septal wall dyssynchrony. Grade 1diastolic dysfunction.       [name] DIABETES MELLITUS, TYPE II 05/15/2008    Well controlled on oral meds.    [name] HYPERLIPIDEMIA 05/11/2008    Mgmt with statin    . HYPERTENSION 05/15/2008    Needs BB 2/2 CAD and ACEI 2/2 DM.     [name] Overweight (BMI 25.0-29.9) 03/27/2012  . Tobacco use 03/27/2012    She has tried Chantix in past with intolerable side effects.   [name] CVA 09/23/2009    Left CEA May 2006 at same time as CABG 2010 : Acute left hemisphere infarct of posterior  limb internal capsule and lateral thalamus.  Prior CVA's but dates unknown, imaging shows widespread multifocal remote lacunar infarcts throughout the brain and brainstem.  MRA 2010 : no severe proximal flow limiting stenosis.    Residual right arm and leg hemiparesis after CVA    reports that she has been smoking Cigarettes.  She has been smoking about 1.00 pack per day. She uses smokeless tobacco. She reports that she does not drink alcohol or use illicit drugs. History reviewed. No pertinent family history.   Living Arrangements: Alone   Abuse/Neglect [facility]) Physical Abuse: Denies Verbal Abuse: Denies Sexual Abuse: Denies Allergies:   Allergies  Allergen Reactions  . Chantix [Varenicline]     Pt couldn't tolerate. Unspecified side effects.   . Codeine     REACTION: hives  . Codeine Phosphate   . Penicillins     REACTION: hives    ACT Assessment Complete:  Yes:    Educational Status    Risk to Self: Risk to self with the past 6 months Is Vanessa Calderon at risk for suicide?: No Substance abuse history and/or treatment for substance abuse?: No  Risk to Others:    Abuse: Abuse/Neglect Assessment (Assessment to be complete while Vanessa Calderon is alone) Physical Abuse: Denies Verbal Abuse: Denies Sexual Abuse: Denies Exploitation of Vanessa Calderon/Vanessa Calderon's resources: Denies Self-Neglect: Denies  Prior Inpatient Therapy:    Prior Outpatient Therapy:    Additional Information:                    Objective: Blood pressure 131/68, pulse 100, temperature 98.6 F (37 C), temperature source Oral, resp. rate 20, height '5\' 6"'  (1.676 m), weight 72 kg (158 lb 11.7 oz), SpO2 100 %.Body mass index is 25.63 kg/(m^2). Results for orders placed or performed during the hospital encounter of 07/17/14 (from the past 72 hour(s))  Protime-INR     Status: None   Collection Time: 07/17/14  2:33 PM  Result Value Ref Range   Prothrombin Time 15.2 11.6 - 15.2 seconds   INR 1.18 0.00 - 1.49  APTT      Status: None   Collection Time: 07/17/14  2:33 PM  Result Value Ref Range   aPTT 33 24 - 37 seconds  CBC     Status: Abnormal   Collection Time: 07/17/14  2:33 PM  Result Value Ref Range   WBC 10.8 (H) 4.0 - 10.5 K/uL   RBC 3.70 (L) 3.87 - 5.11 MIL/uL   Hemoglobin 11.5 (L) 12.0 - 15.0 g/dL   HCT 34.9 (L) 36.0 - 46.0 %   MCV 94.3 78.0 - 100.0 fL   MCH 31.1 26.0 - 34.0 pg   MCHC 33.0 30.0 - 36.0 g/dL   RDW 13.4 11.5 - 15.5 %   Platelets 325 150 - 400 K/uL  Differential     Status: None  Collection Time: 07/17/14  2:33 PM  Result Value Ref Range   Neutrophils Relative % 70 43 - 77 %   Neutro Abs 7.6 1.7 - 7.7 K/uL   Lymphocytes Relative 24 12 - 46 %   Lymphs Abs 2.6 0.7 - 4.0 K/uL   Monocytes Relative 6 3 - 12 %   Monocytes Absolute 0.6 0.1 - 1.0 K/uL   Eosinophils Relative 0 0 - 5 %   Eosinophils Absolute 0.0 0.0 - 0.7 K/uL   Basophils Relative 0 0 - 1 %   Basophils Absolute 0.0 0.0 - 0.1 K/uL  Comprehensive metabolic panel     Status: Abnormal   Collection Time: 07/17/14  2:33 PM  Result Value Ref Range   Sodium 139 137 - 147 mEq/L   Potassium 4.3 3.7 - 5.3 mEq/L   Chloride 101 96 - 112 mEq/L   CO2 25 19 - 32 mEq/L   Glucose, Bld 304 (H) 70 - 99 mg/dL   BUN 11 6 - 23 mg/dL   Creatinine, Ser 0.92 0.50 - 1.10 mg/dL   Calcium 9.5 8.4 - 10.5 mg/dL   Total Protein 7.7 6.0 - 8.3 g/dL   Albumin 3.3 (L) 3.5 - 5.2 g/dL   AST 19 0 - 37 U/L    Comment: HEMOLYSIS AT THIS LEVEL MAY AFFECT RESULT   ALT 17 0 - 35 U/L   Alkaline Phosphatase 129 (H) 39 - 117 U/L   Total Bilirubin 0.3 0.3 - 1.2 mg/dL   GFR calc non Af Amer 63 (L) >90 mL/min   GFR calc Af Amer 74 (L) >90 mL/min    Comment: (NOTE) The eGFR has been calculated using the CKD EPI equation. This calculation has not been validated in all clinical situations. eGFR's persistently <90 mL/min signify possible Chronic Kidney Disease.   Anion gap 13 5 - 15  I-stat troponin, ED (not at [facility])     Status: None   Collection Time:  07/17/14  2:37 PM  Result Value Ref Range   Troponin i, poc 0.01 0.00 - 0.08 ng/mL   Comment 3            Comment: Due to the release kinetics of cTnI, a negative result within the first hours of the onset of symptoms does not rule out myocardial infarction with certainty. If myocardial infarction is still suspected, repeat the test at appropriate intervals.  Glucose, capillary     Status: Abnormal   Collection Time: 07/17/14  9:06 PM  Result Value Ref Range   Glucose-Capillary 245 (H) 70 - 99 mg/dL  Glucose, capillary     Status: Abnormal   Collection Time: 07/17/14 11:59 PM  Result Value Ref Range   Glucose-Capillary 217 (H) 70 - 99 mg/dL  Glucose, capillary     Status: Abnormal   Collection Time: 07/18/14  4:09 AM  Result Value Ref Range   Glucose-Capillary 145 (H) 70 - 99 mg/dL  Glucose, capillary     Status: Abnormal   Collection Time: 07/18/14  4:40 AM  Result Value Ref Range   Glucose-Capillary 154 (H) 70 - 99 mg/dL  Basic metabolic panel     Status: Abnormal   Collection Time: 07/18/14  7:35 AM  Result Value Ref Range   Sodium 145 137 - 147 mEq/L   Potassium 4.4 3.7 - 5.3 mEq/L   Chloride 108 96 - 112 mEq/L   CO2 26 19 - 32 mEq/L   Glucose, Bld 181 (H) 70 - 99 mg/dL  BUN 11 6 - 23 mg/dL   Creatinine, Ser 0.80 0.50 - 1.10 mg/dL   Calcium 9.9 8.4 - 10.5 mg/dL   GFR calc non Af Amer 75 (L) >90 mL/min   GFR calc Af Amer 87 (L) >90 mL/min    Comment: (NOTE) The eGFR has been calculated using the CKD EPI equation. This calculation has not been validated in all clinical situations. eGFR's persistently <90 mL/min signify possible Chronic Kidney Disease.   Anion gap 11 5 - 15  CBC     Status: Abnormal   Collection Time: 07/18/14  7:35 AM  Result Value Ref Range   WBC 11.5 (H) 4.0 - 10.5 K/uL   RBC 4.08 3.87 - 5.11 MIL/uL   Hemoglobin 12.7 12.0 - 15.0 g/dL   HCT 39.2 36.0 - 46.0 %   MCV 96.1 78.0 - 100.0 fL   MCH 31.1 26.0 - 34.0 pg   MCHC 32.4 30.0 - 36.0 g/dL    RDW 13.5 11.5 - 15.5 %   Platelets 370 150 - 400 K/uL  Glucose, capillary     Status: Abnormal   Collection Time: 07/18/14  8:51 AM  Result Value Ref Range   Glucose-Capillary 196 (H) 70 - 99 mg/dL   Comment 1 Notify RN    Comment 2 Documented in Chart   Glucose, capillary     Status: Abnormal   Collection Time: 07/18/14 12:43 PM  Result Value Ref Range   Glucose-Capillary 236 (H) 70 - 99 mg/dL  Glucose, capillary     Status: Abnormal   Collection Time: 07/18/14  4:24 PM  Result Value Ref Range   Glucose-Capillary 345 (H) 70 - 99 mg/dL  Glucose, capillary     Status: Abnormal   Collection Time: 07/18/14  8:18 PM  Result Value Ref Range   Glucose-Capillary 265 (H) 70 - 99 mg/dL   Comment 1 Documented in Chart    Comment 2 Notify RN   Glucose, capillary     Status: Abnormal   Collection Time: 07/18/14 11:51 PM  Result Value Ref Range   Glucose-Capillary 256 (H) 70 - 99 mg/dL   Comment 1 Documented in Chart    Comment 2 Notify RN   Glucose, capillary     Status: Abnormal   Collection Time: 07/19/14  8:40 AM  Result Value Ref Range   Glucose-Capillary 419 (H) 70 - 99 mg/dL   Comment 1 Notify RN    Comment 2 Documented in Chart    Labs are reviewed.  Current Facility-Administered Medications  Medication Dose Route Frequency Provider Last Rate Last Dose  . dexamethasone (DECADRON) injection 6 mg  6 mg Intravenous 4 times per day Julious Oka, MD      . insulin aspart (novoLOG) injection 0-20 Units  0-20 Units Subcutaneous TID WC Julious Oka, MD      . nicotine (NICODERM CQ - dosed in mg/24 hours) patch 14 mg  14 mg Transdermal Daily Julious Oka, MD   14 mg at 07/19/14 1059  . rosuvastatin (CRESTOR) tablet 10 mg  10 mg Oral QPM Julious Oka, MD      . sodium chloride 0.9 % injection 3 mL  3 mL Intravenous Q12H Otho Bellows, MD   3 mL at 07/19/14 0017    Psychiatric Specialty Exam:     Blood pressure 131/68, pulse 100, temperature 98.6 F (37 C), temperature source  Oral, resp. rate 20, height '5\' 6"'  (1.676 m), weight 72 kg (158 lb 11.7 oz), SpO2 100 %.  Body mass index is 25.63 kg/(m^2).  General Appearance: confused and superficially cooperative  Eye Contact::  Minimal  Speech:  Slow and Slurred  Volume:  Decreased  Mood:  Depressed, Dysphoric, Hopeless and Irritable  Affect:  Constricted, Depressed and Flat  Thought Process:  Circumstantial, Irrelevant and Loose  Orientation:  Full (Time, Place, and Person)  Thought Content:  Rumination and thought blocking  Suicidal Thoughts:  No  Homicidal Thoughts:  No  Memory:  Immediate;   Poor Recent;   Poor Remote;   Poor  Judgement:  Impaired  Insight:  Lacking  Psychomotor Activity:  Decreased  Concentration:  Poor  Recall:  Poor  Fund of Knowledge:Fair  Language: Fair  Akathisia:  No  Handed:  Right  AIMS (if indicated):     Assets:  Housing Social Support  Sleep:      Musculoskeletal: Strength & Muscle Tone: unable to assess, Vanessa Calderon is lying on the bed Gait & Station: uunable to assess as Vanessa Calderon is lying in the bed Vanessa Calderon leans: N/A  Treatment Plan Summary: at this time Vanessa Calderon has limited insight into her illness.  She cannot participate in her treatment plan.  She has confusion and delirium.  She is changing her plan on and off.  Daughter wants to be involved in her treatment plan and social worker can help the family to get power of attorney for her health making decisions.  Consider low-dose Prozac if not medically contraindicated to help the depression.  Please call consultation liaison services if you have any further question at [phone].  Ymani Porcher T. 07/19/2014 11:35 AM

## 2014-07-20 DIAGNOSIS — R471 Dysarthria and anarthria: Secondary | ICD-10-CM | POA: Insufficient documentation

## 2014-07-20 DIAGNOSIS — R918 Other nonspecific abnormal finding of lung field: Secondary | ICD-10-CM

## 2014-07-20 LAB — GLUCOSE, CAPILLARY
GLUCOSE-CAPILLARY: 287 mg/dL — AB (ref 70–99)
Glucose-Capillary: 181 mg/dL — ABNORMAL HIGH (ref 70–99)
Glucose-Capillary: 270 mg/dL — ABNORMAL HIGH (ref 70–99)
Glucose-Capillary: 310 mg/dL — ABNORMAL HIGH (ref 70–99)
Glucose-Capillary: 418 mg/dL — ABNORMAL HIGH (ref 70–99)
Glucose-Capillary: 421 mg/dL — ABNORMAL HIGH (ref 70–99)

## 2014-07-20 LAB — LACTATE DEHYDROGENASE: LDH: 153 U/L (ref 94–250)

## 2014-07-20 MED ORDER — HALOPERIDOL 1 MG PO TABS
0.5000 mg | ORAL_TABLET | Freq: Once | ORAL | Status: AC
  Administered 2014-07-20: 0.5 mg via ORAL
  Filled 2014-07-20: qty 1

## 2014-07-20 MED ORDER — DEXAMETHASONE SODIUM PHOSPHATE 10 MG/ML IJ SOLN
4.0000 mg | Freq: Four times a day (QID) | INTRAMUSCULAR | Status: DC
  Administered 2014-07-20 – 2014-07-23 (×13): 4 mg via INTRAVENOUS
  Filled 2014-07-20 (×12): qty 1
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
## 2014-07-20 NOTE — Telephone Encounter (Signed)
This pt is leaving town -I have given directions to PCP -Ok to wait & see if they want referral

## 2014-07-20 NOTE — Progress Notes (Signed)
Speech Language Pathology Treatment: Dysphagia  Patient Details Name: Vanessa Calderon MRN: [id number] DOB: [birth year] Today's Date: 07/20/2014 Time: 8546-2703 SLP Time Calculation (min): 26 min  Assessment / Plan / Recommendation Clinical Impression  SLP was paged by RN that patient is no longer to remain NPO, as biopsy was cancelled. SLP observed part of lunch meal, with coughing noted upon arrival with cup sips of water. Pt required Max faded to Min cues for small bites/sips, oral clearance, and a slow rate. When patient utilizes these strategies, there are no overt signs of aspiration observed. Pt continues to require full supervision give high aspiration risk with impulsivity.   HPI HPI: Patient is a 68 year old female who was recently diagnosed with lung cancer last week, and was brought to the emergency department with dysarthria and balance difficulty. Workup with CT scan showed a large right cerebellar mass concerning for metastatic disease.   Pertinent Vitals Pain Assessment: No/denies pain  SLP Plan  Continue with current plan of care    Recommendations Diet recommendations: Dysphagia 3 (mechanical soft);Thin liquid Liquids provided via: Cup Medication Administration: Whole meds with puree Supervision: Patient able to self feed;Full supervision/cueing for compensatory strategies Compensations: Slow rate;Small sips/bites Postural Changes and/or Swallow Maneuvers: Seated upright 90 degrees              Oral Care Recommendations: Oral care BID Follow up Recommendations: Other (comment) (TBD pending GOC) Plan: Continue with current plan of care    GO      [name], M.A. CCC-SLP [phone]  [name] 07/20/2014, 2:23 PM

## 2014-07-20 NOTE — Progress Notes (Signed)
Patient was screened by Gerlean Ren for appropriateness for an Inpatient Acute Rehab consult.  Per PT/OT notes, it appears that daughter is planning to take her mom back to [place] with her.  Pt. Has been refusing tests and further work up.  At this time, we are not recommending IP rehab consult.  Please call if questions.    [facility] Admissions Coordinator Cell [phone] Office [phone]

## 2014-07-20 NOTE — Progress Notes (Signed)
SLP Cancellation Note  Patient Details Name: Vanessa Calderon MRN: [id number] DOB: Nov 04, 1945   Cancelled treatment:       Reason Eval/Treat Not Completed: Medical issues which prohibited therapy: pt is NPO pending biopsy today, although she did consume a few sips of water with no signs of aspiration, RN made aware. Per chart review, pt remains afebrile with lung sounds clear. SLP to f/u after procedure for PO trials to assess diet tolerance.     [name], M.A. CCC-SLP [phone]  [name] 07/20/2014, 11:20 AM

## 2014-07-20 NOTE — Progress Notes (Signed)
Physical Therapy Treatment Patient Details Name: Vanessa Calderon MRN: [id number] DOB: March 16, 1946 Today's Date: 07/20/2014    History of Present Illness Patient is a 68 y/o female with PMH of CAD s/p CABG, DM, HLD, HTN, CVA with residual R hemiparesis and recently diagnosed R lung mass concerning for malignancy admitted for dysarthria and difficulty ambulating the past 2-3 days. Pt was in the ED 10/9 and was noted to be hypoxic in the 80s and had a CXR done which showed R hilar and perihilar opacities. 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 Discussion about pt wanting to leave AMA and not pursue medical treatment/surgery. Psych consult on 07/19/14 noted that pt has limited insight and cannot participate in treatment planning.    PT Comments    Patient progressing well with mobility. Improved ambulation distance today however pt fatigable as demonstrated by dragging of RLE towards end of gait. Easily distracted by water cup in room today as pt perseverating on wanting to drink something however pt to go down for biopsy today so NPO. Requires constant cues to stay on task. Redirectable. Need to assess ability to negotiate steps next session as pt has 5 steps to climb to get into daughter's home in [place]. Will continue to follow and progress as tolerated.   Follow Up Recommendations  Supervision/Assistance - 24 hour;Other (comment)     Equipment Recommendations  None recommended by PT    Recommendations for Other Services       Precautions / Restrictions  Precautions Precautions: Fall Restrictions Weight Bearing Restrictions: No    Mobility  Bed Mobility Overal bed mobility: Needs Assistance Bed Mobility: Supine to Sit;Sit to Supine     Supine to sit: Supervision;HOB elevated Sit to supine: Supervision   General bed mobility comments: Supervision due to impulsive behavior.  Transfers Overall transfer level: Needs assistance Equipment used: Rolling walker (2 wheeled) Transfers: Sit to/from Stand Sit to Stand: Min guard         General transfer comment: Min guard for safety as pt impulsive. Pt beginning to ambulate without RW x2  trying to walk toward water cup. Distracted by water.  Ambulation/Gait Ambulation/Gait assistance: Min assist Ambulation Distance (Feet): 200 Feet Assistive device: Rolling walker (2 wheeled) Gait Pattern/deviations: Step-through pattern;Decreased stride length;Trunk flexed;Drifts right/left;Decreased dorsiflexion - right   Gait velocity interpretation: Below normal speed for age/gender General Gait Details: Dragging of RLE towards end of gait due to fatigue with tendency to veer right at times bumping into wall x2. Cues to walk within RW and not to the right of it.    Stairs            Wheelchair Mobility    Modified Rankin (Stroke Patients Only)       Balance Overall balance assessment: Needs assistance Sitting-balance support: Feet supported;No upper extremity supported Sitting balance-Leahy Scale: Fair     Standing balance support: During functional activity Standing balance-Leahy Scale: Fair Standing balance comment: Pt impulsively getting up from EOB to walk towards sink where water cup is located without use of RW. Reaching for counter. Unsteady. Requires UE support for  gait for safety/balance.                     Cognition Arousal/Alertness: Awake/alert Behavior During Therapy: Impulsive Overall Cognitive Status: Impaired/Different from baseline Area of  Impairment: Problem solving;Safety/judgement;Following commands;Attention;Memory   Current Attention Level: Sustained Memory: Decreased short-term memory Following Commands: Follows one step commands consistently Safety/Judgement: Decreased awareness of safety   Problem Solving: Requires verbal cues;Requires tactile cues General Comments: Pt perseverating on wanting to drink water throughout treatment. Impaired short term memory as therapist numerous times had to explain reason for not being allowed to have water. Pt able to be redirected with multiple tactile and verbal cues.     Exercises General Exercises - Lower Extremity Ankle Circles/Pumps: Both;20 reps;Seated Long Arc Quad: Both;20 reps;Seated (x2 sets) Hip ABduction/ADduction: Both;20 reps;Seated (x2 sets) Hip Flexion/Marching: Both;20 reps;Seated (x2 sets)    General Comments General comments (skin integrity, edema, etc.): During there ex sitting EOB, pt impulsively stood up to begin walking towards water cup at sink x2. Required therapist performing there ex with patient to maintain attention and counting out loud to stay focused.       Pertinent Vitals/Pain Pain Assessment: No/denies pain    Home Living                      Prior Function            PT Goals (current goals can now be found in the care plan section) Progress towards PT goals: Progressing toward goals    Frequency  Min 4X/week    PT Plan Current plan remains appropriate    Co-evaluation             End of Session Equipment Utilized During Treatment: Gait belt Activity Tolerance: Patient tolerated treatment well Patient left: in bed;with call bell/phone within reach;with bed alarm set     Time: 1135-1158 PT Time Calculation (min): 23 min  Charges:  $Gait Training: 8-22 mins $Therapeutic Exercise: 8-22 mins                    G CodesCandy Calderon A 2014-08-09, 12:12 PM Vanessa Calderon, [place], DPT [phone]

## 2014-07-20 NOTE — Consult Note (Signed)
Chief Complaint: Chief Complaint  Patient presents with  . Aphasia  Lung mass Lymphadenopathy Brain mets  Referring Physician(s): Dr Hulen Luster  History of Present Illness: Vanessa Calderon is a 68 y.o. female  Admitted with altered mental status Unsteady gait/ fell at home Hx CVA; aphasia Hx CAD/CABG CT shows R lung mass; R adrenal mass; hilar and mediastinal lymphadenopathy; R supraclavicular lymph node Brain metastasis Request has been made for biopsy for tissue diagnosis Dr Annamaria Boots has reviewed chart and imaging Feels pt is appropriate for R SCLN vs adrenal mass biopsy   Past Medical History  Diagnosis Date  . CORONARY ARTERY DISEASE 05/15/2008    CABG 2006, DES to the saphenous vein graft to the ramus in December of 2006.  ECHO 2010 : basal inferior wall akinesis. Post-op septal wall dyssynchrony. Grade 1diastolic dysfunction.       [name] DIABETES MELLITUS, TYPE II 05/15/2008    Well controlled on oral meds.    [name] HYPERLIPIDEMIA 05/11/2008    Mgmt with statin    . HYPERTENSION 05/15/2008    Needs BB 2/2 CAD and ACEI 2/2 DM.     [name] Overweight (BMI 25.0-29.9) 03/27/2012  . Tobacco use 03/27/2012    She has tried Chantix in past with intolerable side effects.   [name] CVA 09/23/2009    Left CEA May 2006 at same time as CABG 2010 : Acute left hemisphere infarct of posterior limb internal capsule and lateral thalamus.  Prior CVA's but dates unknown, imaging shows widespread multifocal remote lacunar infarcts throughout the brain and brainstem.  MRA 2010 : no severe proximal flow limiting stenosis.    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 Coronary artery bypass graft  May 2006    Dr Servando Snare  . Abdominal hysterectomy    . Cholecystectomy    . Back surgery      lumbar L3-L5  . Exploratory laparotomy  7/03    Removed the cervix and now has vaginal cuff  . Carotid endarterectomy  May 2006    Left - at same time as CABG. Dr  Tenna Delaine.  . Angioplasty / stenting femoral  11/03/2010    B aorto-fem angiogram with artherectomy and stenting of the R SFA 2/2 claudication.  . Cystoscopy  01/2013    Normal. Dr Matilde Sprang    Allergies: Chantix; Codeine; Codeine phosphate; and Penicillins  Medications: Prior to Admission medications   Medication Sig Start Date End Date Taking? Authorizing Provider  aspirin 325 MG tablet Take 325 mg by mouth daily.   Yes Historical Provider, MD  fesoterodine (TOVIAZ) 8 MG TB24 tablet Take 1 tablet (8 mg total) by mouth daily. 06/12/13  Yes Bartholomew Crews, MD  ibuprofen (ADVIL,MOTRIN) 800 MG tablet Take 800 mg by mouth every 8 (eight) hours as needed for mild pain.   Yes Historical Provider, MD  lisinopril (PRINIVIL,ZESTRIL) 10 MG tablet Take 1 tablet (10 mg total) by mouth daily. 05/06/14  Yes Bartholomew Crews, MD  metFORMIN (GLUCOPHAGE) 500 MG tablet Take 1 tablet (500 mg total) by mouth 2 (two) times daily with a meal. 06/19/14  Yes Otho Bellows, MD  metoprolol succinate (TOPROL XL) 25 MG 24 hr tablet Take 1 tablet (25 mg total) by mouth daily. 05/21/14 05/21/15 Yes Bartholomew Crews, MD  rosuvastatin (CRESTOR) 10 MG tablet Take 10 mg by mouth every evening.   Yes Historical Provider, MD    History  reviewed. No pertinent family history.  History   Social History  . Marital Status: Single    Spouse Name: N/A    Number of Children: N/A  . Years of Education: 12   Occupational History  .  Unemployed   Social History Main Topics  . Smoking status: Current Every Day Smoker -- 1.00 packs/day    Types: Cigarettes  . Smokeless tobacco: Current User     Comment: trying to cut back.  . Alcohol Use: No  . Drug Use: No  . Sexual Activity: None   Other Topics Concern  . None   Social History Narrative   Pt grew up in [place]. Has daughter in [place]. Divorced. Lived in [place] about 2010 but then returned to [place] for a couple of yrs and returned to [place] in 2013. No  family here but has friends. Some gait instability since CVA but no freq falls. Decreased vision in L eye. Has license but no car. Lives in apartment independently.     Review of Systems: A 12 point ROS discussed and pertinent positives are indicated in the HPI above.  All other systems are negative.  Review of Systems  Constitutional: Positive for activity change, appetite change and fatigue. Negative for fever.  Respiratory: Negative for cough and shortness of breath.   Gastrointestinal: Negative for abdominal pain.  Musculoskeletal: Positive for gait problem. Negative for back pain.  Neurological: Positive for speech difficulty and weakness.  Psychiatric/Behavioral: Positive for agitation.    Vital Signs: BP 145/76 mmHg  Pulse 98  Temp(Src) 97.9 F (36.6 C) (Oral)  Resp 18  Ht 5\' 6"  (1.676 m)  Wt 72 kg (158 lb 11.7 oz)  BMI 25.63 kg/m2  SpO2 99%  Physical Exam  Cardiovascular: Normal rate, regular rhythm and normal heart sounds.   Pulmonary/Chest: Effort normal. She has no wheezes. She exhibits tenderness.  Abdominal: Soft. Bowel sounds are normal.  Musculoskeletal: Normal range of motion.  Neurological:  Aphasic Agitated demented  Skin: Skin is warm and dry.  Psychiatric:  Consented dtr at bedside    Imaging: Dg Chest 2 View  06/26/2014   CLINICAL DATA:  Cough and congestion for 2 weeks, low oxygen saturation, personal history coronary artery disease, diabetes mellitus type 2, hypertension, hyperlipidemia, smoking  EXAM: CHEST  2 VIEW  COMPARISON:  03/16/2012  FINDINGS: Mild enlargement of cardiac silhouette post CABG.  Pulmonary vascularity normal.  New enlargement of RIGHT hilum and suprahilar/infrahilar regions, cannot exclude mass or adenopathy.  Mild atelectasis or infiltrate in RIGHT middle lobe.  Remaining lungs clear.  No pleural effusion or pneumothorax.  Osseous structures unremarkable.  IMPRESSION: New RIGHT hilar and perihilar opacities concerning for  adenopathy or mass.  CT chest with contrast recommended to exclude mass and adenopathy.  Mild atelectasis versus infiltrate in RIGHT middle lobe.   Electronically Signed   By: Lavonia Dana M.D.   On: 06/26/2014 16:17   Ct Head (brain) Wo Contrast  07/17/2014   CLINICAL DATA:  Fall, speech difficulty, aphasia, worsening over the past week.  EXAM: CT HEAD WITHOUT CONTRAST  TECHNIQUE: Contiguous axial images were obtained from the base of the skull through the vertex without contrast.  COMPARISON:  04/07/2010  FINDINGS: In the posterior fossa, there is a 3.7 x 3.9 cm heterogeneous hyperdense intra-axial mass centered in the right cerebellum. There is adjacent vasogenic edema and mass effect with displacement of the fourth ventricle to the left. Given the recent chest CT findings, this likely represents an  intracranial metastasis to the right cerebellum.  Age related background atrophy noted. Chronic white matter microvascular ischemic change throughout the cerebral white matter about the ventricles. Mild ventricular enlargement without definite hydrocephalus. Remote bilateral basal ganglia lacunar-type infarcts. Slight mass effect upon the right basilar cistern. No extra-axial fluid collection.  Mastoids and sinuses remain clear.  IMPRESSION: 3.7 x 3.9 cm heterogeneous hyperdense right cerebellar intra-axial mass with surrounding edema and adjacent mass effect as described. Suspect intracranial metastasis given the chest CT findings from 07/09/2014. Recommend further evaluation with brain MRI without and with contrast.  These results were called by telephone at the time of interpretation on 07/17/2014 at 3:27 pm to Dr. Daleen Bo, who verbally acknowledged these results.   Electronically Signed   By: Daryll Brod M.D.   On: 07/17/2014 15:32   Ct Chest W Contrast  07/09/2014   CLINICAL DATA:  68 year old female with abnormal chest x-ray with new right hilar and perihilar opacities concerning for potential  adenopathy or mass.  EXAM: CT CHEST WITH CONTRAST  TECHNIQUE: Multidetector CT imaging of the chest was performed during intravenous contrast administration.  CONTRAST:  94mL OMNIPAQUE IOHEXOL 300 MG/ML  SOLN  COMPARISON:  No prior chest CTs.  Chest x-ray 06/26/2014.  FINDINGS: Mediastinum: Extensive adenopathy is noted, including a 2.7 x 5.6 cm subcarinal nodal mass, right hilar nodal conglomerate measuring 4.4 x 2.6 cm, right peritracheal lymph nodes measuring up to 2.5 x 4.0 cm, superior mediastinal lymph nodes in the right paratracheal station measuring up to 12 mm and adjacent to the it proximal right common carotid and right subclavian arteries measuring 12 mm, and right supraclavicular lymph nodes measuring up to 1 cm in short axis. Heart size is normal. There is no significant pericardial fluid, thickening or pericardial calcification. There is atherosclerosis of the thoracic aorta, the great vessels of the mediastinum and the coronary arteries, including calcified atherosclerotic plaque in the left main, left anterior descending and left circumflex coronary arteries. Status post median sternotomy for CABG, including LIMA to the LAD.  Lungs/Pleura: Associated with the right hilar adenopathy there are extensive postobstructive changes in the right middle lobe. This includes extensive thickening of the peribronchovascular interstitium in the central aspect of the right middle lobe, and an apparent mass which is irregular in shape and therefore difficult to discretely measure, but estimated to be approximately 4.3 x 4.3 x 2.0 cm (as measured on coronal image 33 of series 5 and sagittal image 27 of series 6). This mass abuts the inferior aspect of the right major fissure. 4 mm nodule in the medial aspect of the right upper lobe (image 16 of series 3). No pleural effusions.  Upper Abdomen: 2.9 x 2.0 cm low-attenuation lesion in the right adrenal gland is very similar to prior CT scan 05/22/2009, at which point  this was characterized as an adenoma. Left adrenal gland is normal in appearance.  Musculoskeletal: Median sternotomy wires. There are no aggressive appearing lytic or blastic lesions noted in the visualized portions of the skeleton.  IMPRESSION: 1. Irregular-shaped infiltrative mass in the central aspect of the right middle lobe measuring approximately 4.3 x 4.3 x 2.0 cm, with extensive right hilar, mediastinal and right supraclavicular lymphadenopathy, as detailed above. Assuming a primary non-small cell bronchogenic carcinoma, these findings are compatible with T2a, N3, Mx disease (i.e., at least stage IIIB disease). There is also the potential that this could represent limited stage small cell carcinoma of the lung. Further evaluation with PET-CT and/or biopsy is  recommended for diagnostic and staging purposes. 2. Large right adrenal gland nodule is low-attenuation and very similar to prior examination from 05/22/2009, at which point this was characterized as an adenoma. 3. Atherosclerosis, including left main and 2 vessel coronary artery disease. Please note that although the presence of coronary artery calcium documents the presence of coronary artery disease, the severity of this disease and any potential stenosis cannot be assessed on this non-gated CT examination. Assessment for potential risk factor modification, dietary therapy or pharmacologic therapy may be warranted, if clinically indicated.   Electronically Signed   By: Vinnie Langton M.D.   On: 07/09/2014 11:21   Mr Jeri Cos FH Contrast  07/17/2014   CLINICAL DATA:  Lung cancer.  Abnormal head CT.  EXAM: MRI HEAD WITHOUT AND WITH CONTRAST  TECHNIQUE: Multiplanar, multiecho pulse sequences of the brain and surrounding structures were obtained without and with intravenous contrast.  CONTRAST:  89mL MULTIHANCE GADOBENATE DIMEGLUMINE 529 MG/ML IV SOLN  COMPARISON:  CT head 07/17/2014  FINDINGS: Large hemorrhagic mass in the right lateral cerebellum  compatible with metastatic disease. This measures approximately 4.2 x 3.6 cm. There is edema with mass effect on the fourth ventricle which is displaced to the left. High-density on CT corresponds to significant hemorrhage within the tumor.  Hemorrhagic tumor in the left medial frontal lobe. This shows mild enhancement but no significant edema or mass effect.  Metastatic disease in the posterior sella. The sella is chronically enlarged however there is now enhancing soft tissue mass dorsally in the sella measuring 7 x 14 mm. This may be bony metastasis in the dorsum sella or may be within the infundibulum or pituitary.  Chronic hemorrhagic infarcts in the basal ganglia bilaterally. Chronic ischemia in the pons and left cerebellum. Chronic microvascular ischemia in the cerebral white matter. No acute infarct.  IMPRESSION: Large hemorrhagic metastatic deposit in the right lateral cerebellum with edema and mass-effect on the fourth ventricle. No hydrocephalus.  Hemorrhagic metastatic deposit approximately 12 mm left medial frontal lobe without significant edema.  Metastatic disease in the dorsal sella which may be metastatic disease to the infundibulum and pituitary or possibly bony metastatic disease in the dorsum sella.  Chronic ischemic changes as above.  No acute infarct.   Electronically Signed   By: Franchot Gallo M.D.   On: 07/17/2014 21:06    Labs:  CBC:  Recent Labs  06/26/14 1645 07/09/14 1132 07/17/14 1433 07/18/14 0735  WBC 17.6* 9.9 10.8* 11.5*  HGB 12.6 11.3* 11.5* 12.7  HCT 37.6 34.2* 34.9* 39.2  PLT 353 388 325 370    COAGS:  Recent Labs  07/17/14 1433  INR 1.18  APTT 33    BMP:  Recent Labs  06/19/14 1145 06/26/14 1645 07/09/14 1132 07/17/14 1433 07/18/14 0735  NA 146* 143 143 139 145  K 4.2 2.7* 3.9 4.3 4.4  CL 108 100 102 101 108  CO2 27 28 29 25 26   GLUCOSE 202* 220* 352* 304* 181*  BUN 5* 12 9 11 11   CALCIUM 10.0 9.8 9.4 9.5 9.9  CREATININE 0.96 1.12*  0.92 0.92 0.80  GFRNONAA 62 50*  --  63* 75*  GFRAA 71 58*  --  74* 87*    LIVER FUNCTION TESTS:  Recent Labs  04/22/14 2110 07/17/14 1433  BILITOT 0.2* 0.3  AST 14 19  ALT 12 17  ALKPHOS 109 129*  PROT 7.8 7.7  ALBUMIN 3.7 3.3*    TUMOR MARKERS: No results for input(s):  AFPTM, CEA, CA199, CHROMGRNA in the last 8760 hours.  Assessment and Plan:  AMS; unsteady gait Falls Dementia Aphasia Work up reveals RML lung mass; r adrenal mass; LAN and Brain mets Now scheduled for R SCLN vs adrenal mass biopsy pts dtr aware of procedure benefits and risks and agreeable to proceed Consent signed and in chart  Thank you for this interesting consult.  I greatly enjoyed meeting Vanessa Calderon and look forward to participating in their care.    I spent a total of 40 minutes face to face in clinical consultation, greater than 50% of which was counseling/coordinating care for R SCLN bx vs adrenal mass bx  Signed: Brace Welte A 07/20/2014, 9:18 AM

## 2014-07-20 NOTE — Progress Notes (Signed)
Patient ID: Vanessa Calderon, female   DOB: [birth year], 68 y.o.   MRN: [id number] Patient apparently has been declared not competent to make her a medical decisions and the daughter has elected for her to proceed forward with craniotomy for resection of her right cerebellar mass. We will plan this for Wednesday. I'll discuss the risks and benefits of the operation with her daughter tomorrow. She was not in the room this evening.

## 2014-07-20 NOTE — Progress Notes (Signed)
Inpatient Diabetes Program Recommendations  AACE/ADA: New Consensus Statement on Inpatient Glycemic Control (2013)  Target Ranges:  Prepandial:   less than 140 mg/dL      Peak postprandial:   less than 180 mg/dL (1-2 hours)      Critically ill patients:  140 - 180 mg/dL   Reason for Assessment:  Results for SALLYE, LUNZ (MRN [id number]) as of 07/20/2014 11:57  Ref. Range 07/19/2014 21:37 07/19/2014 23:59 07/20/2014 08:03 07/20/2014 11:33  Glucose-Capillary Latest Range: 70-99 mg/dL 430 (H) 418 (H) 421 (H) 181 (H)    Diabetes history:  Type 2 diabetes Outpatient Diabetes medications: Metformin 500 mg bid Current orders for Inpatient glycemic control:  Novolog resistant tid with meals Note that patient is on IV steroids which are likely contributing to elevated CBG's.  May consider adding Levemir 14 units daily to prevent wide excursions in CBG's while on steroids.  Thanks, Adah Perl, RN, BC-ADM Inpatient Diabetes Coordinator Pager [phone]

## 2014-07-21 LAB — GLUCOSE, CAPILLARY
Glucose-Capillary: 351 mg/dL — ABNORMAL HIGH (ref 70–99)
Glucose-Capillary: 361 mg/dL — ABNORMAL HIGH (ref 70–99)
Glucose-Capillary: 421 mg/dL — ABNORMAL HIGH (ref 70–99)
Glucose-Capillary: 317 mg/dL — ABNORMAL HIGH (ref 70–99)
Glucose-Capillary: 342 mg/dL — ABNORMAL HIGH (ref 70–99)
Glucose-Capillary: 468 mg/dL — ABNORMAL HIGH (ref 70–99)

## 2014-07-21 MED ORDER — INSULIN GLARGINE 100 UNIT/ML ~~LOC~~ SOLN
10.0000 [IU] | Freq: Every day | SUBCUTANEOUS | Status: DC
  Filled 2014-07-21: qty 0.1

## 2014-07-21 MED ORDER — INSULIN GLARGINE 100 UNIT/ML ~~LOC~~ SOLN
22.0000 [IU] | Freq: Once | SUBCUTANEOUS | Status: DC
  Filled 2014-07-21: qty 0.22

## 2014-07-21 MED ORDER — INSULIN ASPART 100 UNIT/ML ~~LOC~~ SOLN
22.0000 [IU] | Freq: Once | SUBCUTANEOUS | Status: AC
  Administered 2014-07-21: 22 [IU] via SUBCUTANEOUS

## 2014-07-21 MED ORDER — INSULIN GLARGINE 100 UNIT/ML ~~LOC~~ SOLN
5.0000 [IU] | Freq: Every day | SUBCUTANEOUS | Status: DC
  Administered 2014-07-21: 5 [IU] via SUBCUTANEOUS
  Filled 2014-07-21 (×2): qty 0.05

## 2014-07-21 MED ORDER — RESOURCE THICKENUP CLEAR PO POWD
ORAL | Status: DC | PRN
  Filled 2014-07-21: qty 125

## 2014-07-21 NOTE — Progress Notes (Signed)
Subjective: Pt has no complaints, eating breakfast.   Objective: Vital signs in last 24 hours: Filed Vitals:   07/20/14 2103 07/21/14 0226 07/21/14 0715 07/21/14 1019  BP: 116/64 147/77 131/77 122/65  Pulse: 87 80 80 85  Temp: 98.1 F (36.7 C) 98.2 F (36.8 C) 98.6 F (37 C) 98.2 F (36.8 C)  TempSrc: Oral Oral Oral Oral  Resp: 16 20 20 20   Height:      Weight:      SpO2: 99% 100% 100% 99%   PE General: NAD, dysarthric speech but clear and intelligible HEENT: moist mucous membranes Lungs: CTAB Cardiac: RRR, no murmurs Neuro: CN2-12 grossly intact, negative pronator drift, difficulty with index finger to thumb on rt hand.   Lab Results: Basic Metabolic Panel:  Recent Labs Lab 07/17/14 1433 07/18/14 0735  NA 139 145  K 4.3 4.4  CL 101 108  CO2 25 26  GLUCOSE 304* 181*  BUN 11 11  CREATININE 0.92 0.80  CALCIUM 9.5 9.9   Liver Function Tests:  Recent Labs Lab 07/17/14 1433  AST 19  ALT 17  ALKPHOS 129*  BILITOT 0.3  PROT 7.7  ALBUMIN 3.3*   CBC:  Recent Labs Lab 07/17/14 1433 07/18/14 0735  WBC 10.8* 11.5*  NEUTROABS 7.6  --   HGB 11.5* 12.7  HCT 34.9* 39.2  MCV 94.3 96.1  PLT 325 370   CBG:  Recent Labs Lab 07/19/14 2359 07/20/14 0803 07/20/14 1133 07/20/14 1626 07/20/14 1754 07/20/14 2158  GLUCAP 418* 421* 181* 351* 310* 287*   Coagulation:  Recent Labs Lab 07/17/14 1433  LABPROT 15.2  INR 1.18   Studies/Results: No results found. Medications: I have reviewed the patient's current medications. Scheduled Meds: . dexamethasone  4 mg Intravenous 4 times per day  . insulin aspart  0-20 Units Subcutaneous TID WC  . nicotine  14 mg Transdermal Daily  . rosuvastatin  10 mg Oral QPM  . sodium chloride  3 mL Intravenous Q12H   Continuous Infusions:  PRN Meds:. Assessment/Plan: Principal Problem:   Brain metastasis Active Problems:   Diabetes mellitus type 2 in obese   HLD (hyperlipidemia)   Essential hypertension  Coronary atherosclerosis   Tobacco use   Mass of middle lobe of right lung   Metastatic cancer to brain   Acute delirium   Depression   Mood disorder due to a general medical condition   Dysarthria  Metastatic lung cancer with brain metastases: Diagnosed- 06/26/2014, on chest CT- presumed to be non-small cell bronchogenic carcinoma, compatible with at least stage IIIB disease With MRi findings of evidence of mets and mass effect. Pt seen by psych and determined that she does not have capacity for medical decision making.  -Decadron 4mg  Q6h - IR saw patient and would be able to take pt to u/s for rt supraclavicular lymph node or rt adrenal gland tissue biopsy, however Dr. Saintclair Halsted from neurosx will take pt to OR tomorrow for rt cerebellar mass removal. -will place consult for radiation oncology - LDH WNL  DM2: A1c 7.6 on 05/21/14. She is on Metformin 500mg  BID at home, which was held on admission.  -SSI resistant 2/2 to steroid use  HTN: Stable, normotensive during admission. Pt on Toprol XL 25mg  daily and lisinopril 10mg  daily at home.  - holding home meds  HLD: On Crestor 10mg  QPM at home  - continued home med  Urinary incontinence: Evaluated by urology 01/2013. Mixed stress and urge incontinence/overactive bladder and a neurogenic bladder  with a functional component. She is on fesoterodine 8mg  daily  -Home fesoterodine 8mg  daily held   FEN:  -resume dysphagia 3, thin liquids diet   DVT prophylaxis: SCDs  Dispo: Disposition deferred until Wed after rt cerebellar mass removal by Dr. Saintclair Halsted  The patient does have a current PCP Bartholomew Crews, MD) and does need an [facility] hospital follow-up appointment after discharge.  The patient does not have transportation limitations that hinder transportation to clinic appointments.  .Services Needed at time of discharge: Y = Yes, Blank = No PT: SNF  OT: SNF  RN:   Equipment: 3 in 1 bedside comode, hospital bed  Other:     LOS: 4 days    Julious Oka, MD 07/21/2014, 11:35 AM

## 2014-07-21 NOTE — Progress Notes (Addendum)
CBG was 421, MD notified as there are no insulin administration parameters for this level.   Awaiting orders.  Verbal order to administer 22 units of Novolog.

## 2014-07-21 NOTE — Consult Note (Signed)
Ethics Consult: Initial evaluation  Asked to see patient by Dr. Kary Kos, neurosurgery.  He has outlined his concerns nicely in his progress note addendum of 07/21/14.  Briefly, 68 yo patient with CNS lesion likely due to metastatic lung ca who is refusing active treatments (e.g. Neurosurg) and has been deemed to lack capacity by psych consult.    The ethical principle is clear: Patients always have the right to refuse active treatments even if we do not agree with their choices.  Per Dr. Saintclair Halsted, Ms Gomez has firmly said she does not want further treatments.  Of course, the decision to refuse, like any medical decision, is contingent upon patient having the capacity to understand the consequences of the decision.  Ms Bier has been seen by psych on 07/19/14 and felt to lack capacity due to delirium.  General recommendations: a two-pronged approach: 1. Given that delirium is generally a temporary condition, the best approach is to wait until the delirium clears and then ask the patient to make this important decision for herself.  What is the likelihood of the delirium clearing without neurosurgical intervention?  Can you ask psych to reevaluate to see if patient has regained capacity?  A few days of delay is well worth the investment if the patient is expected to recover to the point where she can decide for herself. 2.  Hope for the best and prepare for the worst.  While it would be best if the patient's delirium clears and she can decide for herself, the providers should also prepare for the possibility that she will not regain capacity.  As such, we then revert to the typical hierarchy of surrogate decision making.  Generally, that hierarchy for adults is: A. Competent patient B Advanced directives if they exist. C Spouse or Guardian D Majority of first degree relatives. My understanding (providers please verify the accuracy) is that no advanced directives exist and their is no spouse or guardian.   She does have an adult daughter who is actively involved in her care and that the daughter is urging surgery.  Are there other first degree relatives who should be identified and given the opportunity to participate in the discussion?  Social work has seen the patient and usually would be quite helpful in identifying these individuals.  These first degree relatives should participate in a goals of care meeting.  It is important to remind them that they are not supposed to say what they want: rather, they are supposed to speak for Ms Ginty and say what she would want if given this choice.  Because these goals of care meeting are time consuming and highly nuanced, I suggest you involve palliative care to conduct the meeting.  Specific recommendations. 1. Hold off surgery until questions of capacity, appropriate decision-maker and goals of care are clarified. 2. Re-consult psych with the specific question: Has patient regained capacity since the 11/1 evaluation?  If no, do they anticipate the patient can regain capacity any time soon? 3. Consult palliative care to conduct a goals of care meeting with family.  Social work already involved and they can help arrange the meeting.    I am happy to see again from the ethics standpoint for lingering questions or to facilitate a family discussion if the care team deems it would be helpful.  Contact me via the ethics pager [phone] Thanks, Domenic Schwab, MD

## 2014-07-21 NOTE — Plan of Care (Signed)
Problem: SLP Dysphagia Goals Goal: Patient will utilize recommended strategies Patient will utilize recommended strategies during swallow to increase swallowing safety with  Outcome: Progressing

## 2014-07-21 NOTE — Progress Notes (Signed)
Speech Language Pathology Treatment: Dysphagia  Patient Details Name: Vanessa Calderon MRN: [id number] DOB: [birth year] Today's Date: 07/21/2014 Time: 6213-0865 SLP Time Calculation (min): 18 min  Assessment / Plan / Recommendation Clinical Impression  SLP alerted by RN that pt observed to have persistent coughing with thin liquids. SLP observed pt with self feeding, immediate evidence of aspiration observed, very congested cough. With max tactile cues for small single sips, pt tolerates thin liquids well. Given that staff is unable to provide this level of assist with all PO and pt is eager to drink independently, attempted nectar thick liquids via straw with independent impulsive self feeding, with improved result. Recommend downgrade to Dys 3/nectar thick liquids. Pt may have intermittent supervision. If pt desires thin ice water, RN may provide max assist for small single sips. Discussed with RN, will modify orders and request thickened liquids.    HPI HPI: Patient is a 68 year old female who was recently diagnosed with lung cancer last week, and was brought to the emergency department with dysarthria and balance difficulty. Workup with CT scan showed a large right cerebellar mass concerning for metastatic disease.   Pertinent Vitals Pain Assessment: No/denies pain  SLP Plan  Continue with current plan of care    Recommendations Diet recommendations: Dysphagia 3 (mechanical soft);Nectar-thick liquid Liquids provided via: Cup Medication Administration: Whole meds with puree Supervision: Patient able to self feed;Intermittent supervision to cue for compensatory strategies Compensations: Slow rate;Small sips/bites Postural Changes and/or Swallow Maneuvers: Seated upright 90 degrees              Plan: Continue with current plan of care    GO    [facility], MA CCC-SLP [phone]  Lynann Beaver 07/21/2014, 2:51 PM

## 2014-07-21 NOTE — Progress Notes (Signed)
Inpatient Diabetes Program Recommendations  AACE/ADA: New Consensus Statement on Inpatient Glycemic Control (2013)  Target Ranges:  Prepandial:   less than 140 mg/dL      Peak postprandial:   less than 180 mg/dL (1-2 hours)      Critically ill patients:  140 - 180 mg/dL   Results for Vanessa Calderon, Vanessa Calderon (MRN [id number]) as of 07/21/2014 11:26  Ref. Range 07/20/2014 08:03 07/20/2014 11:33 07/20/2014 16:26 07/20/2014 17:54 07/20/2014 21:58  Glucose-Capillary Latest Range: 70-99 mg/dL 421 (H) 181 (H) 351 (H) 310 (H) 287 (H)   Diabetes history: Type 2 diabetes Outpatient Diabetes medications: Metformin 500 mg bid Current orders for Inpatient glycemic control:  Novolog resistant tid with meals  Note that patient is on IV steroids which are likely contributing to elevated CBG's.  May consider adding Levemir 14 units daily to prevent wide excursions in CBG's while on steroids. Please consider changing diet to carb modified dysphasia diet.   Gentry Fitz, RN, BA, MHA, CDE Diabetes Coordinator Inpatient Diabetes Program  [phone] (Team Pager) [phone] [facility]) 07/21/2014 11:33 AM

## 2014-07-21 NOTE — Progress Notes (Signed)
Physical Therapy Treatment Patient Details Name: Vanessa Calderon MRN: [id number] DOB: [birth year] Today's Date: 07/21/2014    History of Present Illness Patient is a 68 y/o female with PMH of CAD s/p CABG, DM, HLD, HTN, CVA with residual R hemiparesis and recently diagnosed R lung mass concerning for malignancy admitted for dysarthria and difficulty ambulating the past 2-3 days. Pt was in the ED 10/9 and was noted to be hypoxic in the 80s and had a CXR done which showed R hilar and perihilar opacities. 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 Discussion about pt wanting to leave AMA and not pursue medical treatment/surgery. Psych consult on 07/19/14 noted that pt has limited insight and cannot participate in treatment planning. Deciding whether to perform surgical excision of a cerebellar mass.     PT Comments    Patient progressing well with mobility. Tolerated stair negotiation with Mod A to ascend and Min guard to descend with increased time due to fatigue and weakness. Continues to have difficulty with safety due to impulsiveness. Will need to educate daughter on safe stair negotiation prior to discharge. Pt possibly to have surgery on 11/4 to remove cerebellar mass? Will continue to follow post op surgery to assess any further needs/changes in mobility.   Follow Up Recommendations  Supervision/Assistance - 24 hour;Other (comment)     Equipment Recommendations  None recommended by PT    Recommendations for Other Services       Precautions /  Restrictions Precautions Precautions: Fall Restrictions Weight Bearing Restrictions: No    Mobility  Bed Mobility               General bed mobility comments: Received sitting in chair upon arrival.   Transfers Overall transfer level: Needs assistance Equipment used: Rolling walker (2 wheeled) Transfers: Sit to/from Stand Sit to Stand: Min guard         General transfer comment: Impulsive. VC for hand placement. Stood from chair and from stairs in stairwell using rail for support.  Ambulation/Gait Ambulation/Gait assistance: Min guard Ambulation Distance (Feet): 250 Feet Assistive device: Rolling walker (2 wheeled) Gait Pattern/deviations: Step-through pattern;Decreased stride length;Decreased dorsiflexion - right;Trunk flexed Gait velocity: slow   General Gait Details: Dragging of RLE towards end of gait due to fatigue with tendency to veer left at times bumping into wall x3. Cues for RW proximity.   Stairs Stairs: Yes Stairs assistance: Mod assist Stair Management: One rail Right;Step to pattern Number of Stairs: 4 General stair comments: Handheld assist (Mod A) to place LE on stair however uses BUEs to pull up on rail to advance other LE to step. Mod A to ascend, Min guard to descend steps. Fatigues requiring seated rest break.  Wheelchair Mobility    Modified Rankin (Stroke Patients Only)       Balance Overall balance assessment: Needs assistance Sitting-balance support: Feet supported;No upper extremity supported Sitting balance-Leahy Scale: Fair     Standing balance support: During functional activity Standing balance-Leahy Scale: Poor Standing balance comment: Requires BUE support on RW for safety/balance.  Cognition Arousal/Alertness: Awake/alert Behavior During Therapy: Impulsive Overall Cognitive Status: Impaired/Different from baseline Area of Impairment: Problem solving;Safety/judgement;Following  commands;Attention;Memory   Current Attention Level: Sustained Memory: Decreased short-term memory Following Commands: Follows one step commands consistently Safety/Judgement: Decreased awareness of safety Awareness: Intellectual Problem Solving: Requires verbal cues;Requires tactile cues General Comments: Pt perseverating on drinking water, noted to cough. RN made aware.    Exercises General Exercises - Lower Extremity Ankle Circles/Pumps: Both;15 reps;Seated Long Arc Quad: Both;15 reps;Seated Hip ABduction/ADduction: Both;15 reps;Seated Hip Flexion/Marching: Both;15 reps;Seated    General Comments        Pertinent Vitals/Pain Pain Assessment: No/denies pain    Home Living                      Prior Function            PT Goals (current goals can now be found in the care plan section) Acute Rehab PT Goals Patient Stated Goal: To take her home to [facility] with me Progress towards PT goals: Progressing toward goals    Frequency  Min 4X/week    PT Plan Current plan remains appropriate    Co-evaluation             End of Session Equipment Utilized During Treatment: Gait belt Activity Tolerance: Patient limited by fatigue Patient left: in chair;with call bell/phone within reach;with chair alarm set;with nursing/sitter in room     Time: 1127-1150 PT Time Calculation (min): 23 min  Charges:  $Gait Training: 23-37 mins                    G CodesCandy Calderon A 2014-07-28, 12:51 PM  Vanessa Calderon, [place], DPT [phone]

## 2014-07-21 NOTE — Progress Notes (Signed)
Patient ID: Vanessa Calderon, female   DOB: 1945-11-26, 68 y.o.   MRN: [id number] Medicine attending: I personally examined this patient today together with resident physician Dr. Matthew Folks and I concur with her evaluation and management plan except for changes in management outlined below. We appreciate ongoing neurosurgery assistance. 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 She insists on going home. She is adamant that she does not want surgery.  Unfortunately her daughter is not present this morning.  I would like to get a radiation oncology opinion prior to discharge if she is not going to proceed with surgical resection of a large, dominant, hemorrhagic, cerebellar, metastasis. If she agrees to radiation, this is typically given as an outpatient. Our radiation oncologists may be able to interface with the radiation oncologists at the [facility] in [place] where the patient's daughter wants to bring her mother. The daughter is the main family support for this woman.

## 2014-07-21 NOTE — Progress Notes (Addendum)
Patient ID: Vanessa Calderon, female   DOB: [birth year], 69 y.o.   MRN: [id number] Patient is awake and alert this morning I have reviewed all the notes in the chart I saw the psychiatric diagnosis of incompetence however I have some serous and major concerns with the declaration of this lady's incompetence when she seems adamant determined an appropriate and clear in her desires not to proceed forward with any further surgical intervention. I have major concerns and taking a lady who will be screaming on her way to the operating room that she does not want surgery because she is deemed incompetent. The brain lesion itself would not affect her judgment. At this point I think we should consult medical ethics have him review the chart and see if it's appropriate to proceed forward with that treatment  When it seems apparent to myself and the nursing staff that is not with this lady's wishes are.  We all recognize that surgical excision of a cerebellar mass although I do believe it will improve her quality of life it is not a curative operation she still has metastatic cancer.

## 2014-07-21 NOTE — Progress Notes (Signed)
Occupational Therapy Treatment Patient Details Name: Vanessa Calderon MRN: [id number] DOB: 27-Oct-1945 Today's Date: 07/21/2014    History of present illness Patient is a 68 y/o female with PMH of CAD s/p CABG, DM, HLD, HTN, CVA with residual R hemiparesis and recently diagnosed R lung mass concerning for malignancy admitted for dysarthria and difficulty ambulating the past 2-3 days. Pt was in the ED 10/9 and was noted to be hypoxic in the 80s and had a CXR done which showed R hilar and perihilar opacities. 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 Discussion about pt wanting to leave AMA and not pursue medical treatment/surgery. Psych consult on 07/19/14 noted that pt has limited insight and cannot participate in treatment planning.   OT comments  Pt readily willing to work with OT.  Perseverating on drinking water.  Pt performed UB bathing and dressing with min assist and stood for assist with pericare.  Pt with urinary incontinence.   Follow Up Recommendations  Supervision/Assistance - 24 hour;Other (comment) (daughter with plans to take her to [place])    Equipment Recommendations  3 in 1 bedside comode;Hospital bed (RW)    Recommendations for Other Services      Precautions / Restrictions Precautions Precautions: Fall Restrictions Weight Bearing Restrictions: No       Mobility Bed Mobility               General bed mobility comments: not assessed, up in chair  Transfers Overall transfer level: Needs assistance Equipment used:  (held bed  rail) Transfers: Sit to/from Stand Sit to Stand: Min guard         General transfer comment: impulsive, verbal cues to push up from chair, steadied herself holding bedrail    Balance                                   ADL Overall ADL's : Needs assistance/impaired     Grooming: Wash/dry face;Supervision/safety;Sitting   Upper Body Bathing: Minimal assitance;Sitting Upper Body Bathing Details (indicate cue type and reason): verbal cues for thoroughness     Upper Body Dressing : Minimal assistance;Sitting           Toileting- Clothing Manipulation and Hygiene: Total assistance;Sit to/from stand Toileting - Clothing Manipulation Details (indicate cue type and reason): Pt sitting in large amount of urine with adult diaper on, total assist to change diaper and clean up urine/replace pad.       General ADL Comments: Pt very willing to work with OT.      Vision                     Perception     Praxis      Cognition   Behavior During Therapy: Impulsive Overall Cognitive Status: Impaired/Different from baseline Area of Impairment: Problem solving;Safety/judgement;Following commands;Attention;Memory   Current Attention Level: Sustained Memory: Decreased short-term memory  Following Commands: Follows one step commands consistently Safety/Judgement: Decreased awareness of safety Awareness: Intellectual Problem Solving: Requires verbal cues;Requires tactile cues General Comments:  Pt perseverating on drinking water, noted to cough.  Notified Mickel Baas from [employer], engineering.    Extremity/Trunk Assessment               Exercises     Shoulder Instructions       General Comments      Pertinent Vitals/ Pain       Pain Assessment: No/denies pain  Home Living                                          Prior Functioning/Environment              Frequency Min 1X/week     Progress Toward Goals  OT Goals(current goals  can now be found in the care plan section)  Progress towards OT goals: Progressing toward goals  Acute Rehab OT Goals Patient Stated Goal: To take her home to [facility] with me Time For Goal Achievement: 08/02/14  Plan Discharge plan remains appropriate    Co-evaluation                 End of Session     Activity Tolerance Patient tolerated treatment well   Patient Left in chair;with call bell/phone within reach;with chair alarm set   Nurse Communication Other (comment) (coughing on water)        Time: 1007-1219 OT Time Calculation (min): 32 min  Charges: OT General Charges $OT Visit: 1 Procedure OT Treatments $Self Care/Home Management : 23-37 mins  Malka So 07/21/2014, 11:27 AM  [phone]

## 2014-07-21 NOTE — Progress Notes (Signed)
Patient ID: Vanessa Calderon, female   DOB: 01/01/1946, 68 y.o.   MRN: [id number] Medicine attending addendum to earlier note: I met with the patient's daughter and her local caregiver this evening. I reviewed her status. I showed them the CT scans of the chest and the MRI scan of the brain and discussed  further management of what is most likely metastatic non-small cell lung cancer. I also discussed her situation with Dr. Madison Hickman who represents the ethics committee and who kindly saw and evaluated this patient at the request of the neurosurgeon. Although the patient has a fluctuating mental status, she is adamant that she does not want surgery. I am not going to force her to undergo a major procedure on her brain when she is uncomfortable doing this.. I reviewed again that this was an advanced cancer that we might help to control for a number of months but could not cure. All parties agreed that the quality of her life and keeping her comfortable where the highest priorities. The daughter is still interested in bringing her mother back home with her to [place]. Pending consultation by radiation oncology, I will make contacts with an oncologist at the [facility] while the patient's daughter makes the appropriate travel arrangements.

## 2014-07-22 ENCOUNTER — Ambulatory Visit
Admit: 2014-07-22 | Discharge: 2014-07-22 | Disposition: A | Discharge status: Home / Self Care | Payer: BC Managed Care – PPO | Attending: Radiation Oncology | Admitting: Radiation Oncology

## 2014-07-22 DIAGNOSIS — C7931 Secondary malignant neoplasm of brain: Secondary | ICD-10-CM

## 2014-07-22 LAB — GLUCOSE, CAPILLARY
GLUCOSE-CAPILLARY: 378 mg/dL — AB (ref 70–99)
GLUCOSE-CAPILLARY: 475 mg/dL — AB (ref 70–99)
Glucose-Capillary: 233 mg/dL — ABNORMAL HIGH (ref 70–99)
Glucose-Capillary: 265 mg/dL — ABNORMAL HIGH (ref 70–99)

## 2014-07-22 MED ORDER — INSULIN GLARGINE 100 UNIT/ML ~~LOC~~ SOLN
18.0000 [IU] | Freq: Every day | SUBCUTANEOUS | Status: DC
  Administered 2014-07-23: 18 [IU] via SUBCUTANEOUS
  Filled 2014-07-22 (×2): qty 0.18

## 2014-07-22 MED ORDER — GLUCERNA SHAKE PO LIQD: 237.0000 mL | Freq: Two times a day (BID) | ORAL | Status: DC | PRN

## 2014-07-22 MED ORDER — INSULIN STARTER KIT- PEN NEEDLES (ENGLISH)
1.0000 | Freq: Once | Status: DC
Start: 2014-07-22 — End: 2014-07-23
  Filled 2014-07-22: qty 1

## 2014-07-22 MED ORDER — LIVING WELL WITH DIABETES BOOK
Freq: Once | Status: DC
  Filled 2014-07-22: qty 1

## 2014-07-22 NOTE — Progress Notes (Signed)
NUTRITION FOLLOW UP  Intervention:   Encourage PO intake Provide Glucerna Shakes BID PRN  Nutrition Dx:   Inadequate oral intake related to inability to eat as evidenced by NPO status; discontinued- diet advanced   New Nutrition Dx: Increased nutrient needs related to catabolic illness as evidenced by recent unintentional weight loss  Goal:   Pt to meet >/= 90% of their estimated nutrition needs; unmet  Monitor:   Diet advancement, PO intake, labs, weight trend  Assessment:   67-year-old woman with recently diagnosed R middle lobe lung mass concerning for cancer, presented with dysarthria and gait abnormality on 10/30.  Patient reports that her appetite is good and she has been eating well. Per family at bedside patient has been eating 100% of most meals the past few days but, only 50% today. Patient requesting 2 teas and 2 diet cokes at time of visit. RD encouraged ongoing good PO intake and for pt to maintain weight.   10/31: Patient with metastatic lung cancer with a brain lesion. Patient remains NPO at this time. SLP to evaluate patient today for potential diet upgrade. Patient is at nutrition risk, given recent 15% weight loss in 2 months and new dx metastatic lung CA.  Labs: elevated glucose  Height: Ht Readings from Last 1 Encounters:  07/17/14 5' 6" (1.676 m)    Weight Status:   Wt Readings from Last 1 Encounters:  07/17/14 158 lb 11.7 oz (72 kg)    Re-estimated needs:  Kcal: 1900-2100 Protein: 100-115 gm Fluid: 2 L  Skin: WDL  Diet Order: DIET DYS 3- Nectar-thick  No intake or output data in the 24 hours ending 07/22/14 1524  Last BM: 10/31   Labs:   Recent Labs Lab 07/17/14 1433 07/18/14 0735  NA 139 145  K 4.3 4.4  CL 101 108  CO2 25 26  BUN 11 11  CREATININE 0.92 0.80  CALCIUM 9.5 9.9  GLUCOSE 304* 181*    CBG (last 3)   Recent Labs  07/21/14 2143 07/22/14 0636 07/22/14 1125  GLUCAP 468* 378* 475*    Scheduled Meds: .  dexamethasone  4 mg Intravenous 4 times per day  . insulin aspart  0-20 Units Subcutaneous TID WC  . insulin glargine  18 Units Subcutaneous QHS  . insulin starter kit- pen needles  1 kit Other Once  . living well with diabetes book   Does not apply Once  . nicotine  14 mg Transdermal Daily  . rosuvastatin  10 mg Oral QPM  . sodium chloride  3 mL Intravenous Q12H    Continuous Infusions:    Barnett RD, LDN Inpatient Clinical Dietitian Pager: [phone] After Hours Pager: [phone]  

## 2014-07-22 NOTE — Progress Notes (Signed)
Spoke with patient and her daughter about diabetes and home regimen for diabetes control. The discharge plan is for patient to be discharged on steroids and insulin due to hyperglycemic related to the steroids being given.  Discussed impact of nutrition, exercise, stress, sickness, and medications on diabetes control. Patient's daughter will be taking care of the patient and administering her insulin. Patient's daughter has Type 2 diabetes and has some basic knowledge of diabetes. Reviewed the Living Well with Diabetes booklet with patient's daughter.  Discussed A1C results (7.6% on 05/21/14) and explained what an A1C is, basic pathophysiology of DM Type 2, basic home care, importance of checking CBGs and maintaining good CBG control to prevent long-term and short-term complications.  Stressed impact of steroids on glucose control. Patient's daughter reports that her mother use to have a glucometer but she does not know where it is and would like a prescription for a new glucometer and testing supplies. Reviewed normal glucose values, hyperglycemia, hypoglycemia along with treatment for both. Discussed carbohydrates, carbohydrate goals per day and meal, along with portion sizes.   Patient and her daughter report that the patient use to be on Levemir insulin in the past so they are familiar with how to use an insulin pen but agreed to have a refresher on an insulin pen. Educated patient's daughter on insulin pen use at home.  Reviewed all steps of insulin pen including attachment of needle, 2-unit air shot, dialing up dose, giving injection, removing needle, disposal of sharps, storage of unused insulin, disposal of insulin etc. Patient's daughter is able to provide successful return demonstration.  Patient verbalized understanding of information discussed and she states that she has no further questions at this time related to diabetes.  RNs to provide ongoing basic DM education at bedside with this patient and  engage patient to actively check blood glucose and administer insulin injections. Have ordered educational booklet, insulin starter kit, and DM videos. Bedside RN will need to review insulin pen starter kit with patient and her daughter when it arrives from pharmacy since it was not available at this time.  MD to give patient Rxs for glucometer, testing supplies, insulin pens, and insulin pen needles.  Thanks, Barnie Alderman, RN, MSN, CCRN Diabetes Coordinator Inpatient Diabetes Program [phone] (Team Pager) [phone] ([facility] office) [phone] [facility] office)

## 2014-07-22 NOTE — Progress Notes (Addendum)
Subjective: Pt has no complaints, eating breakfast. Remembers this writer's name, still stating she would like to go home but agreeable to meeting w/ rad/onc today.  Objective: Vital signs in last 24 hours: Filed Vitals:   07/21/14 1831 07/21/14 2140 07/22/14 0144 07/22/14 0646  BP: 138/72 122/74 167/72 151/81  Pulse: 80 83 88 89  Temp: 97.6 F (36.4 C) 98.1 F (36.7 C) 98.8 F (37.1 C) 99 F (37.2 C)  TempSrc: Oral Oral Oral Oral  Resp: 20 18 16 20   Height:      Weight:      SpO2: 98% 97% 100% 96%   PE General: NAD, dysarthric speech but clear and intelligible HEENT: moist mucous membranes Lungs: CTAB Cardiac: RRR, no murmurs GI: active bowel sounds Neuro: CN2-12 grossly intact  Lab Results: Basic Metabolic Panel:  Recent Labs Lab 07/17/14 1433 07/18/14 0735  NA 139 145  K 4.3 4.4  CL 101 108  CO2 25 26  GLUCOSE 304* 181*  BUN 11 11  CREATININE 0.92 0.80  CALCIUM 9.5 9.9   Liver Function Tests:  Recent Labs Lab 07/17/14 1433  AST 19  ALT 17  ALKPHOS 129*  BILITOT 0.3  PROT 7.7  ALBUMIN 3.3*   CBC:  Recent Labs Lab 07/17/14 1433 07/18/14 0735  WBC 10.8* 11.5*  NEUTROABS 7.6  --   HGB 11.5* 12.7  HCT 34.9* 39.2  MCV 94.3 96.1  PLT 325 370   CBG:  Recent Labs Lab 07/21/14 0713 07/21/14 1144 07/21/14 1650 07/21/14 1722 07/21/14 2143 07/22/14 0636  GLUCAP 361* 421* 317* 342* 468* 378*   Coagulation:  Recent Labs Lab 07/17/14 1433  LABPROT 15.2  INR 1.18   Medications: I have reviewed the patient's current medications. Scheduled Meds: . dexamethasone  4 mg Intravenous 4 times per day  . insulin aspart  0-20 Units Subcutaneous TID WC  . insulin glargine  5 Units Subcutaneous QHS  . nicotine  14 mg Transdermal Daily  . rosuvastatin  10 mg Oral QPM  . sodium chloride  3 mL Intravenous Q12H   Continuous Infusions:  PRN Meds:. Assessment/Plan: Principal Problem:   Brain metastasis Active Problems:   Diabetes mellitus  type 2 in obese   HLD (hyperlipidemia)   Essential hypertension   Coronary atherosclerosis   Tobacco use   Mass of middle lobe of right lung   Metastatic cancer to brain   Acute delirium   Depression   Mood disorder due to a general medical condition   Dysarthria  Metastatic lung cancer with brain metastases: Diagnosed- 06/26/2014, on chest CT- presumed to be non-small cell bronchogenic carcinoma, compatible with at least stage IIIB disease With MRi findings of evidence of mets and mass effect. Pt seen by psych and determined that she does not have capacity for medical decision making. However upon further discussion w/ daughter and patient it was decided to proceed w/ rad/onc consult vs neurosx (which was against pt's wishes) and to ultimately have patient live with daughter in [place].  -Decadron 4mg  Q6h will be continued on d/c, will also start pt on insulin regimen. Daughter believes patient has a glucometer at home.  -consulted radiation oncology yesterday however there was not a neuro-oncologist in the clinic yesterday. Called today and confirmed that someone from rad/onc will see patient today.  - pt's daughter given CD of brain and chest imaging to bring with her to [place]  DM2: A1c 7.6 on 05/21/14. She is on Metformin 500mg  BID at home, which  was held on admission.  -SSI resistant 2/2 to steroid use - lantus 5 units qhs  HTN: Pt on Toprol XL 25mg  daily and lisinopril 10mg  daily at home. Mildy elevated overnight. - holding home meds, will consider restarting home meds if BP remains elevated  HLD:  -Crestor 10mg    Urinary incontinence: Evaluated by urology 01/2013. Mixed stress and urge incontinence/overactive bladder and a neurogenic bladder with a functional component. She is on fesoterodine 8mg  daily  -Home fesoterodine 8mg  daily held   FEN:  -dysphagia 3, thin liquids diet   DVT prophylaxis: SCDs  Dispo: Disposition deferred until consult w/ rad/onc   The patient does  have a current PCP Bartholomew Crews, MD) and does need an [facility] hospital follow-up appointment after discharge.  The patient does not have transportation limitations that hinder transportation to clinic appointments.  .Services Needed at time of discharge: Y = Yes, Blank = No PT: SNF  OT: SNF  RN:   Equipment: 3 in 1 bedside comode, hospital bed  Other:     LOS: 5 days   Julious Oka, MD 07/22/2014, 8:18 AM

## 2014-07-22 NOTE — Progress Notes (Signed)
Inpatient Diabetes Program Recommendations  AACE/ADA: New Consensus Statement on Inpatient Glycemic Control (2013)  Target Ranges:  Prepandial:   less than 140 mg/dL      Peak postprandial:   less than 180 mg/dL (1-2 hours)      Critically ill patients:  140 - 180 mg/dL   Results for Vanessa Calderon, Vanessa Calderon (MRN [id number]) as of 07/22/2014 12:43  Ref. Range 07/21/2014 07:13 07/21/2014 11:44 07/21/2014 16:50 07/21/2014 17:22 07/21/2014 21:43 07/22/2014 06:36 07/22/2014 11:25  Glucose-Capillary Latest Range: 70-99 mg/dL 361 (H) 421 (H) 317 (H) 342 (H) 468 (H) 378 (H) 475 (H)   Diabetes history: DM2 Outpatient Diabetes medications: Metformin 500 mg BID Current orders for Inpatient glycemic control: Lantus 5 units QHS, Novolog 0-20 units AC  Inpatient Diabetes Program Recommendations Insulin - Basal: Please consider increasing Lantus to 18 units daily to start now (based on 72 kg x 0.25 units). Insulin - Meal Coverage: Please consider ordering Novolog 5 units TID with meals if patient eats at least 50% of meal.  Thanks, Barnie Alderman, RN, MSN, CCRN, CDE Diabetes Coordinator Inpatient Diabetes Program [phone] (Team Pager) [phone] (AP office) [phone] [facility] office)

## 2014-07-22 NOTE — Progress Notes (Signed)
Unable to provide bedside teaching regarding the insulin pen starter kit because the patient's daughter has left for the day.  Will pass onto next shift to make sure the education gets done once the daughter returns.   , RN 

## 2014-07-22 NOTE — Consult Note (Signed)
Radiation Oncology         [phone] ________________________________  Name: Vanessa SPRUIELL MRN: [id number]  Date: 07/17/2014  DOB: Feb 15, 1946   DIAGNOSIS: The primary encounter diagnosis was Brain metastases. Diagnoses of Dysarthria, Gait abnormality, and Brain metastasis were also pertinent to this visit.   HISTORY OF PRESENT ILLNESS::Vanessa Calderon is a 68 y.o. female who is seen for an initial consultation visit regarding the patient's diagnosis of likely brain metastasis, most likely of lung origin.  The patient presented with couh/ hypoxia and was found to have a right lung mas on xray. CT scan confirmed a 4 cm right lung mas with extensive lymphadenopathy.  She then developed right-side weakness, dysarthria and ataxia. A CT scan showed a large right cerebellar mass that was confirmed on MRI. Additional lesions seen in the left frontal lobe and posterior sella.  Neurosurgery has discussed resection of the cerebellar mass with the patient and her family. The patient has strongly indicated that she does not to proceed with such surgery. Both psychiatry and ethics team has been involved in assessing the patient's wishes/competence. The patient and her daughter wish to return to [place] as soon as possible, where the daughter lives and where the patient is from.  No pathology available - a supraclavicular node was planned to be biopsied at one point, but I do not see that this has been completed with the family wishes to pursue management in [place].    PREVIOUS RADIATION THERAPY: No   PAST MEDICAL HISTORY:  has a past medical history of CORONARY ARTERY DISEASE (05/15/2008); DIABETES MELLITUS, TYPE II (05/15/2008); HYPERLIPIDEMIA (05/11/2008); HYPERTENSION (05/15/2008); Overweight (BMI 25.0-29.9) (03/27/2012); Tobacco use (03/27/2012); and CVA (09/23/2009).     PAST SURGICAL HISTORY: Past Surgical History  Procedure Laterality Date  . Coronary artery bypass graft  May 2006    Dr Servando Snare  . Abdominal hysterectomy    . Cholecystectomy    . Back surgery      lumbar L3-L5  . Exploratory laparotomy  7/03    Removed the cervix and now has vaginal cuff  . Carotid endarterectomy  May 2006    Left - at same time as CABG. Dr Tenna Delaine.  . Angioplasty / stenting femoral  11/03/2010    B aorto-fem angiogram with artherectomy and stenting of the R SFA 2/2 claudication.  . Cystoscopy  01/2013    Normal. Dr Matilde Sprang     FAMILY HISTORY: family history is not on file.   SOCIAL HISTORY:  reports that she has been smoking Cigarettes.  She has been smoking about 1.00 pack per day. She uses smokeless tobacco. She reports that she does not drink alcohol or use illicit drugs.   ALLERGIES: Chantix; Codeine; Codeine phosphate; and Penicillins   MEDICATIONS:  Current Facility-Administered Medications  Medication Dose Route Frequency Provider Last Rate Last Dose  . dexamethasone (DECADRON) injection 4 mg  4 mg Intravenous 4 times per day Julious Oka, MD   4 mg at 07/22/14 1721  . feeding supplement (GLUCERNA SHAKE) (GLUCERNA SHAKE) liquid 237 mL  237 mL Oral BID PRN Baird Lyons, RD      . insulin aspart (novoLOG) injection 0-20 Units  0-20 Units Subcutaneous TID WC Julious Oka, MD   7 Units at 07/22/14 1720  . insulin glargine (LANTUS) injection 18 Units  18 Units Subcutaneous QHS Ejiroghene E Emokpae, MD      . insulin starter kit- pen needles (English) 1 kit  1 kit Other Once Julious Oka,  MD      . living well with diabetes book MISC   Does not apply Once Julious Oka, MD      . nicotine (Craig - dosed in mg/24 hours) patch 14 mg  14 mg Transdermal Daily Julious Oka, MD   14 mg at 07/22/14 1024  . [place]   Oral PRN Katherene Ponto Deblois, CCC-SLP      . rosuvastatin (CRESTOR) tablet 10 mg  10 mg Oral QPM Julious Oka, MD   10 mg at 07/21/14 1654  . sodium chloride 0.9 % injection 3 mL  3 mL Intravenous Q12H Otho Bellows, MD   3 mL at  07/22/14 1025     REVIEW OF SYSTEMS:  A 15 point review of systems is documented in the electronic medical record. This was obtained by the nursing staff. However, I reviewed this with the patient to discuss relevant findings and make appropriate changes.  Pertinent items are noted in HPI.    PHYSICAL EXAM:  height is '5\' 6"'  (1.676 m) and weight is 158 lb 11.7 oz (72 kg). Her oral temperature is 97.8 F (36.6 C). Her blood pressure is 157/87 and her pulse is 100. Her respiration is 18 and oxygen saturation is 100%.   ECOG = 2-3  0 - Asymptomatic (Fully active, able to carry on all predisease activities without restriction)  1 - Symptomatic but completely ambulatory (Restricted in physically strenuous activity but ambulatory and able to carry out work of a light or sedentary nature. For example, light housework, office work)  2 - Symptomatic, <50% in bed during the day (Ambulatory and capable of all self care but unable to carry out any work activities. Up and about more than 50% of waking hours)  3 - Symptomatic, >50% in bed, but not bedbound (Capable of only limited self-care, confined to bed or chair 50% or more of waking hours)  4 - Bedbound (Completely disabled. Cannot carry on any self-care. Totally confined to bed or chair)  5 - Death   Vanessa Calderon, Creech RH, Tormey DC, et al. [phone]). "Toxicity and response criteria of the [facility]". [place] [facility] Oncol. 5 (6): 649-55  General:  in no acute distress, dysarthria, patient is able to answer questions appropriately HEENT: Normocephalic, atraumatic Cardiovascular: Regular rate and rhythm Respiratory: Clear to auscultation bilaterally GI: Soft, nontender, normal bowel sounds Extremities: No edema present   LABORATORY DATA:  Lab Results  Component Value Date   WBC 11.5* 07/18/2014   HGB 12.7 07/18/2014   HCT 39.2 07/18/2014   MCV 96.1 07/18/2014   PLT 370 07/18/2014   Lab Results  Component Value Date    NA 145 07/18/2014   K 4.4 07/18/2014   CL 108 07/18/2014   CO2 26 07/18/2014   Lab Results  Component Value Date   ALT 17 07/17/2014   AST 19 07/17/2014   ALKPHOS 129* 07/17/2014   BILITOT 0.3 07/17/2014      RADIOGRAPHY: Dg Chest 2 View  06/26/2014   CLINICAL DATA:  Cough and congestion for 2 weeks, low oxygen saturation, personal history coronary artery disease, diabetes mellitus type 2, hypertension, hyperlipidemia, smoking  EXAM: CHEST  2 VIEW  COMPARISON:  03/16/2012  FINDINGS: Mild enlargement of cardiac silhouette post CABG.  Pulmonary vascularity normal.  New enlargement of RIGHT hilum and suprahilar/infrahilar regions, cannot exclude mass or adenopathy.  Mild atelectasis or infiltrate in RIGHT middle lobe.  Remaining lungs clear.  No pleural  effusion or pneumothorax.  Osseous structures unremarkable.  IMPRESSION: New RIGHT hilar and perihilar opacities concerning for adenopathy or mass.  CT chest with contrast recommended to exclude mass and adenopathy.  Mild atelectasis versus infiltrate in RIGHT middle lobe.   Electronically Signed   By: Lavonia Dana M.D.   On: 06/26/2014 16:17   Ct Head (brain) Wo Contrast  07/17/2014   CLINICAL DATA:  Fall, speech difficulty, aphasia, worsening over the past week.  EXAM: CT HEAD WITHOUT CONTRAST  TECHNIQUE: Contiguous axial images were obtained from the base of the skull through the vertex without contrast.  COMPARISON:  04/07/2010  FINDINGS: In the posterior fossa, there is a 3.7 x 3.9 cm heterogeneous hyperdense intra-axial mass centered in the right cerebellum. There is adjacent vasogenic edema and mass effect with displacement of the fourth ventricle to the left. 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  Age related background atrophy noted. Chronic white matter microvascular ischemic change throughout the cerebral white matter about the ventricles. Mild ventricular enlargement  without definite hydrocephalus. Remote bilateral basal ganglia lacunar-type infarcts. Slight mass effect upon the right basilar cistern. No extra-axial fluid collection.  Mastoids and sinuses remain clear.  IMPRESSION: 3.7 x 3.9 cm heterogeneous hyperdense right cerebellar intra-axial mass with surrounding edema and adjacent mass effect as described. Suspect intracranial metastasis given the chest CT findings from 07/09/2014. Recommend further evaluation with brain MRI without and with contrast.  These results were called by telephone at the time of interpretation on 07/17/2014 at 3:27 pm to Dr. Daleen Bo, who verbally acknowledged these results.   Electronically Signed   By: Daryll Brod M.D.   On: 07/17/2014 15:32   Ct Chest W Contrast  07/09/2014   CLINICAL DATA:  68 year old female with abnormal chest x-ray with new right hilar and perihilar opacities concerning for potential adenopathy or mass.  EXAM: CT CHEST WITH CONTRAST  TECHNIQUE: Multidetector CT imaging of the chest was performed during intravenous contrast administration.  CONTRAST:  33m OMNIPAQUE IOHEXOL 300 MG/ML  SOLN  COMPARISON:  No prior chest CTs.  Chest x-ray 06/26/2014.  FINDINGS: Mediastinum: Extensive adenopathy is noted, including a 2.7 x 5.6 cm subcarinal nodal mass, right hilar nodal conglomerate measuring 4.4 x 2.6 cm, right peritracheal lymph nodes measuring up to 2.5 x 4.0 cm, superior mediastinal lymph nodes in the right paratracheal station measuring up to 12 Calderon and adjacent to the it proximal right common carotid and right subclavian arteries measuring 12 Calderon, and right supraclavicular lymph nodes measuring up to 1 cm in short axis. Heart size is normal. There is no significant pericardial fluid, thickening or pericardial calcification. There is atherosclerosis of the thoracic aorta, the great vessels of the mediastinum and the coronary arteries, including calcified atherosclerotic plaque in the left main, left anterior  descending and left circumflex coronary arteries. Status post median sternotomy for CABG, including LIMA to the LAD.  Lungs/Pleura: Associated with the right hilar adenopathy there are extensive postobstructive changes in the right middle lobe. This includes extensive thickening of the peribronchovascular interstitium in the central aspect of the right middle lobe, and an apparent mass which is irregular in shape and therefore difficult to discretely measure, but estimated to be approximately 4.3 x 4.3 x 2.0 cm (as measured on coronal image 33 of series 5 and sagittal image 27 of series 6). This mass abuts the inferior aspect of the right major fissure. 4 Calderon nodule in the  medial aspect of the right upper lobe (image 16 of series 3). No pleural effusions.  Upper Abdomen: 2.9 x 2.0 cm low-attenuation lesion in the right adrenal gland is very similar to prior CT scan 05/22/2009, at which point this was characterized as an adenoma. Left adrenal gland is normal in appearance.  Musculoskeletal: Median sternotomy wires. There are no aggressive appearing lytic or blastic lesions noted in the visualized portions of the skeleton.  IMPRESSION: 1. Irregular-shaped infiltrative mass in the central aspect of the right middle lobe measuring approximately 4.3 x 4.3 x 2.0 cm, with extensive right hilar, mediastinal and right supraclavicular lymphadenopathy, as detailed above. Assuming a primary non-small cell bronchogenic carcinoma, these findings are compatible with T2a, N3, Mx disease (i.e., at least stage IIIB disease). There is also the potential that this could represent limited stage small cell carcinoma of the lung. Further evaluation with PET-CT and/or biopsy is recommended for diagnostic and staging purposes. 2. Large right adrenal gland nodule is low-attenuation and very similar to prior examination from 05/22/2009, at which point this was characterized as an adenoma. 3. Atherosclerosis, including left main and 2 vessel  coronary artery disease. Please note that although the presence of coronary artery calcium documents the presence of coronary artery disease, the severity of this disease and any potential stenosis cannot be assessed on this non-gated CT examination. Assessment for potential risk factor modification, dietary therapy or pharmacologic therapy may be warranted, if clinically indicated.   Electronically Signed   By: Vinnie Langton M.D.   On: 07/09/2014 11:21   Mr Jeri Cos XA Contrast  07/17/2014   CLINICAL DATA:  Lung cancer.  Abnormal head CT.  EXAM: MRI HEAD WITHOUT AND WITH CONTRAST  TECHNIQUE: Multiplanar, multiecho pulse sequences of the brain and surrounding structures were obtained without and with intravenous contrast.  CONTRAST:  22m MULTIHANCE GADOBENATE DIMEGLUMINE 529 MG/ML IV SOLN  COMPARISON:  CT head 07/17/2014  FINDINGS: Large hemorrhagic mass in the right lateral cerebellum compatible with metastatic disease. This measures approximately 4.2 x 3.6 cm. There is edema with mass effect on the fourth ventricle which is displaced to the left. High-density on CT corresponds to significant hemorrhage within the tumor.  Hemorrhagic tumor in the left medial frontal lobe. This shows mild enhancement but no significant edema or mass effect.  Metastatic disease in the posterior sella. The sella is chronically enlarged however there is now enhancing soft tissue mass dorsally in the sella measuring 7 x 14 Calderon. This may be bony metastasis in the dorsum sella or may be within the infundibulum or pituitary.  Chronic hemorrhagic infarcts in the basal ganglia bilaterally. Chronic ischemia in the pons and left cerebellum. Chronic microvascular ischemia in the cerebral white matter. No acute infarct.  IMPRESSION: Large hemorrhagic metastatic deposit in the right lateral cerebellum with edema and mass-effect on the fourth ventricle. No hydrocephalus.  Hemorrhagic metastatic deposit approximately 12 Calderon left medial frontal  lobe without significant edema.  Metastatic disease in the dorsal sella which may be metastatic disease to the infundibulum and pituitary or possibly bony metastatic disease in the dorsum sella.  Chronic ischemic changes as above.  No acute infarct.   Electronically Signed   By: CFranchot GalloM.D.   On: 07/17/2014 21:06       IMPRESSION:  The patient has apparent metastatic lung cancer with brain metastasis. No pathology available. [place], and the patient has refused surgery which  typically would be recommended for the patient's large cerebellar tumor.  The patient states she is feeling much better, and wishes for discharge tomorrow. The patient's daughter was not present during my consult.   PLAN: Discharge and the patient will return to [place]. I will place a referral to radiation oncology there. I gave the patient my contact information, and I will try to contact her daughter tomorrow. Recommend she continue decadron 4 mg QID until further instructions given to her in [place].       ________________________________   Jodelle Gross, MD, PhD   **Disclaimer: This note was dictated with voice recognition software. Similar sounding words can inadvertently be transcribed and this note may contain transcription errors which may not have been corrected upon publication of note.**

## 2014-07-22 NOTE — Progress Notes (Signed)
Patient ID: Vanessa Calderon, female   DOB: 1945-12-01, 68 y.o.   MRN: [id number] Medicine attending: I interviewed and examined this patient together with resident physician Dr. Julious Oka. Patient is overall clinically stable. Partial response to dexamethasone. Speech mildly dysarthric. No nystagmus. Less clumsiness of her right hand. We are waiting for a radiation oncology consultation. Per the daughter's request, I have made preliminary contact with [place] in [place] phone [phone] and I have requested an appointment with one of their thoracic oncologists Dr. Gearldine Bienenstock and I am waiting to hear back. I was informed that it is their policy to have a biopsy proven diagnosis prior to evaluation. I am hoping to speak with one of their oncologists and explain the patient's situation. If it is absolutely necessary, we will try to obtain a biopsy of one of her supraclavicular lymph nodes but I have no doubt that this is metastatic lung cancer. We have made copies of her radiographs on CDs and a discharge summary will be dictated by the resident physician. Once we can confirm a appointment for the patient, she can be discharged. Above plan discussed with the patient's daughter who is her main caregiver and will be bringing her mother back with her to [place].

## 2014-07-22 NOTE — Progress Notes (Signed)
Physical Therapy Treatment Patient Details Name: Vanessa Calderon MRN: [id number] DOB: [birth year] Today's Date: 07/22/2014    History of Present Illness Patient is a 68 y/o female with PMH of CAD s/p CABG, DM, HLD, HTN, CVA with residual R hemiparesis and recently diagnosed R lung mass concerning for malignancy admitted for dysarthria and difficulty ambulating the past 2-3 days. Pt was in the ED 10/9 and was noted to be hypoxic in the 80s and had a CXR done which showed R hilar and perihilar opacities. 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 Discussion about pt wanting to leave AMA and not pursue medical treatment/surgery. Psych consult on 07/19/14 noted that pt has limited insight and cannot participate in treatment planning. Deciding whether to perform surgical excision of a cerebellar mass.     PT Comments    Patient agreeable to ambulation however when stood up, patient with uncontrolled bowel movements and unaware that she was soiled in the chair. A for pericare in standing required. Patient impulsive with decisions and mobility. Planning to meet with oncologist later today. Continue to follow with current POC  Follow Up Recommendations  Supervision/Assistance - 24 hour;Other (comment)     Equipment Recommendations  None recommended by PT    Recommendations for Other Services       Precautions / Restrictions Precautions Precautions: Fall    Mobility  Bed Mobility               General bed mobility comments: Received sitting in chair upon  arrival.   Transfers Overall transfer level: Needs assistance Equipment used: Rolling walker (2 wheeled)   Sit to Stand: Min guard         General transfer comment: Impulsive. VC for hand placement and safety  Ambulation/Gait Ambulation/Gait assistance: Min assist Ambulation Distance (Feet): 80 Feet Assistive device: Rolling walker (2 wheeled) Gait Pattern/deviations: Step-through pattern;Decreased stride length Gait velocity: slow   General Gait Details: A for use of RW as patient lifting it off ground to avoid objects. Ambulation limited by uncontrolled bowels   Stairs            Wheelchair Mobility    Modified Rankin (Stroke Patients Only)       Balance                                    Cognition Arousal/Alertness: Awake/alert Behavior During Therapy: Impulsive Overall Cognitive Status: Impaired/Different from baseline Area of Impairment: Problem solving;Safety/judgement;Following commands;Attention;Memory     Memory: Decreased short-term memory Following Commands: Follows one step commands consistently Safety/Judgement: Decreased awareness of safety   Problem Solving: Requires verbal cues;Requires tactile cues      Exercises      General Comments        Pertinent Vitals/Pain Pain Assessment: No/denies pain    Home Living                      Prior Function            PT Goals (  current goals can now be found in the care plan section) Progress towards PT goals: Progressing toward goals    Frequency  Min 4X/week    PT Plan Current plan remains appropriate    Co-evaluation             End of Session   Activity Tolerance: Patient limited by fatigue Patient left: in chair;with call bell/phone within reach;with family/visitor present     Time: 2919-1660 PT Time Calculation (min): 23 min  Charges:  $Gait Training: 8-22 mins $Therapeutic Activity: 8-22 mins                    G Codes:       Jacqualyn Posey 07/22/2014, 10:39 AM 07/22/2014 Jacqualyn Posey PTA [phone] pager [phone] office

## 2014-07-23 ENCOUNTER — Telehealth: Payer: Self-pay | Admitting: Internal Medicine

## 2014-07-23 DIAGNOSIS — R269 Unspecified abnormalities of gait and mobility: Secondary | ICD-10-CM | POA: Insufficient documentation

## 2014-07-23 LAB — GLUCOSE, CAPILLARY
GLUCOSE-CAPILLARY: 241 mg/dL — AB (ref 70–99)
Glucose-Capillary: 275 mg/dL — ABNORMAL HIGH (ref 70–99)
Glucose-Capillary: 455 mg/dL — ABNORMAL HIGH (ref 70–99)
Glucose-Capillary: 143 mg/dL — ABNORMAL HIGH (ref 70–99)

## 2014-07-23 LAB — GLUCOSE, RANDOM: GLUCOSE: 455 mg/dL — AB (ref 70–99)

## 2014-07-23 MED ORDER — INSULIN ASPART 100 UNIT/ML FLEXPEN: 5.0000 [IU] | PEN_INJECTOR | Freq: Three times a day (TID) | SUBCUTANEOUS | Status: AC

## 2014-07-23 MED ORDER — FREESTYLE SYSTEM KIT: 1.0000 | PACK | Status: AC | PRN

## 2014-07-23 MED ORDER — INSULIN GLARGINE 100 UNIT/ML ~~LOC~~ SOLN: 25.0000 [IU] | Freq: Every day | SUBCUTANEOUS | Status: DC

## 2014-07-23 MED ORDER — LIVING WELL WITH DIABETES BOOK: 1.0000 | Freq: Once | Status: AC

## 2014-07-23 MED ORDER — GLUCOSE BLOOD VI STRP: ORAL_STRIP | Status: AC

## 2014-07-23 MED ORDER — INSULIN GLARGINE 100 UNIT/ML SOLOSTAR PEN: 25.0000 [IU] | PEN_INJECTOR | Freq: Every day | SUBCUTANEOUS | Status: AC

## 2014-07-23 MED ORDER — INSULIN ASPART 100 UNIT/ML ~~LOC~~ SOLN: 7.0000 [IU] | Freq: Three times a day (TID) | SUBCUTANEOUS | Status: DC

## 2014-07-23 MED ORDER — GLUCERNA SHAKE PO LIQD: 237.0000 mL | Freq: Two times a day (BID) | ORAL | Status: AC | PRN

## 2014-07-23 NOTE — Telephone Encounter (Signed)
   Reason for call:   I received a call from Ms. Hadley Pen Ms. Abelino Derrick at 11:15 PM indicating she was able to get the insulin pens and pen needles for her mom.  She called to confirm the amount of insulin.   Pertinent Data:   Ms. Clayton Lefort has used insulin pen in the past.  She was taught to use the pen during her mom's recent admission.  She dialed back to 25 (in between 24 and 26) and the pen dialed back down to 0 after administration.     Assessment / Plan / Recommendations:   I confirmed that she dialed back to 25 and injected once. She asked if she needed to use the entire pen because it says "single use."  I explained that this meant the pen was for use by only one individual.  I told her to use only one injection.  I explained that the pen holds several doses, would zero after each injection and should last several days.  She voiced understanding.  As always, pt is advised that if symptoms worsen or new symptoms arise, they should go to an urgent care facility or to to ER for further evaluation.   Francesca Oman, DO   07/23/2014, 11:29 PM

## 2014-07-23 NOTE — Discharge Instructions (Signed)
Start taking lantus 25 units at night and novolog 5 units three times a day with meals. Check your blood sugars three times a day.   Make sure to take decadron 4mg  four times a day. This is important to help keep her brain swelling down.   Ruthie, if your mother's blood sugars are higher than 500 or too high for the glucometer to read take your mom to the ED. If your mom becomes slow to respond to you and you notice changes in your mom's behavior go to the ED. If your mom's blood sugar is less than 70 do not take insulin.

## 2014-07-23 NOTE — Progress Notes (Signed)
Discharge orders received. Pt for discharge home today. IV d/c'd. Pt given discharge instructions with verbalized understanding. Family in room to assist with discharge. Staff brought pt downstairs via wheelchair.

## 2014-07-23 NOTE — Discharge Summary (Signed)
Name: Vanessa Calderon MRN: [id number] DOB: 05/02/1946 68 y.o. PCP: Bartholomew Crews, MD  Date of Admission: 07/17/2014  2:31 PM Date of Discharge: 07/23/2014 Attending Physician: Dr. Beryle Beams  Discharge Diagnosis:  Principal Problem:   Brain metastasis Active Problems:   Diabetes mellitus type 2 in obese   HLD (hyperlipidemia)   Essential hypertension   Coronary atherosclerosis   Tobacco use   Mass of middle lobe of right lung   Metastatic cancer to brain   Acute delirium   Depression   Mood disorder due to a general medical condition   Dysarthria  Discharge Medications:   Medication List    STOP taking these medications        aspirin 325 MG tablet     ibuprofen 800 MG tablet  Commonly known as:  ADVIL,MOTRIN      TAKE these medications        feeding supplement (GLUCERNA SHAKE) Liqd  Take 237 mLs by mouth 2 (two) times daily as needed (Please offer to patient if she consumes <75% of meal).     insulin aspart 100 UNIT/ML injection  Commonly known as:  NOVOLOG  Inject 7 Units into the skin 3 (three) times daily before meals.     insulin glargine 100 UNIT/ML injection  Commonly known as:  LANTUS  Inject 0.25 mLs (25 Units total) into the skin at bedtime.     lisinopril 10 MG tablet  Commonly known as:  PRINIVIL,ZESTRIL  Take 1 tablet (10 mg total) by mouth daily.     living well with diabetes book Misc  1 each by Does not apply route once.     metFORMIN 500 MG tablet  Commonly known as:  GLUCOPHAGE  Take 1 tablet (500 mg total) by mouth 2 (two) times daily with a meal.     metoprolol succinate 25 MG 24 hr tablet  Commonly known as:  TOPROL XL  Take 1 tablet (25 mg total) by mouth daily.     rosuvastatin 10 MG tablet  Commonly known as:  CRESTOR  Take 10 mg by mouth every evening.     TOVIAZ 8 MG Tb24 tablet  Generic drug:  fesoterodine  Take 1 tablet (8 mg total) by mouth daily.        Disposition and follow-up:   Ms.Lindzie C  Ruark was discharged from [facility] in [place] condition.  At the hospital follow up visit please address:  1.  Please adjust decadron dose as needed and adjust insulin regimen as needed.   2.  Labs / imaging needed at time of follow-up: CBG  3.  Pending labs/ test needing follow-up: none  Follow-up Appointments:   Discharge Instructions:     Discharge Instructions    DME Bedside commode    Complete by:  As directed      For home use only DME 4 wheeled rolling walker with seat    Complete by:  As directed            Consultations: Treatment Team:  Elaina Hoops, MD  Procedures Performed:  Dg Chest 2 View  06/26/2014   CLINICAL DATA:  Cough and congestion for 2 weeks, low oxygen saturation, personal history coronary artery disease, diabetes mellitus type 2, hypertension, hyperlipidemia, smoking  EXAM: CHEST  2 VIEW  COMPARISON:  03/16/2012  FINDINGS: Mild enlargement of cardiac silhouette post CABG.  Pulmonary vascularity normal.  New enlargement of RIGHT hilum and suprahilar/infrahilar regions, cannot exclude mass or  adenopathy.  Mild atelectasis or infiltrate in RIGHT middle lobe.  Remaining lungs clear.  No pleural effusion or pneumothorax.  Osseous structures unremarkable.  IMPRESSION: New RIGHT hilar and perihilar opacities concerning for adenopathy or mass.  CT chest with contrast recommended to exclude mass and adenopathy.  Mild atelectasis versus infiltrate in RIGHT middle lobe.   Electronically Signed   By: Lavonia Dana M.D.   On: 06/26/2014 16:17   Ct Head (brain) Wo Contrast  07/17/2014   CLINICAL DATA:  Fall, speech difficulty, aphasia, worsening over the past week.  EXAM: CT HEAD WITHOUT CONTRAST  TECHNIQUE: Contiguous axial images were obtained from the base of the skull through the vertex without contrast.  COMPARISON:  04/07/2010  FINDINGS: In the posterior fossa, there is a 3.7 x 3.9 cm heterogeneous hyperdense intra-axial mass centered in the right  cerebellum. There is adjacent vasogenic edema and mass effect with displacement of the fourth ventricle to the left. 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  Age related background atrophy noted. Chronic white matter microvascular ischemic change throughout the cerebral white matter about the ventricles. Mild ventricular enlargement without definite hydrocephalus. Remote bilateral basal ganglia lacunar-type infarcts. Slight mass effect upon the right basilar cistern. No extra-axial fluid collection.  Mastoids and sinuses remain clear.  IMPRESSION: 3.7 x 3.9 cm heterogeneous hyperdense right cerebellar intra-axial mass with surrounding edema and adjacent mass effect as described. Suspect intracranial metastasis given the chest CT findings from 07/09/2014. Recommend further evaluation with brain MRI without and with contrast.  These results were called by telephone at the time of interpretation on 07/17/2014 at 3:27 pm to Dr. Daleen Bo, who verbally acknowledged these results.   Electronically Signed   By: Daryll Brod M.D.   On: 07/17/2014 15:32   Ct Chest W Contrast  07/09/2014   CLINICAL DATA:  68 year old female with abnormal chest x-ray with new right hilar and perihilar opacities concerning for potential adenopathy or mass.  EXAM: CT CHEST WITH CONTRAST  TECHNIQUE: Multidetector CT imaging of the chest was performed during intravenous contrast administration.  CONTRAST:  39m OMNIPAQUE IOHEXOL 300 MG/ML  SOLN  COMPARISON:  No prior chest CTs.  Chest x-ray 06/26/2014.  FINDINGS: Mediastinum: Extensive adenopathy is noted, including a 2.7 x 5.6 cm subcarinal nodal mass, right hilar nodal conglomerate measuring 4.4 x 2.6 cm, right peritracheal lymph nodes measuring up to 2.5 x 4.0 cm, superior mediastinal lymph nodes in the right paratracheal station measuring up to 12 mm and adjacent to the it proximal right common carotid and right  subclavian arteries measuring 12 mm, and right supraclavicular lymph nodes measuring up to 1 cm in short axis. Heart size is normal. There is no significant pericardial fluid, thickening or pericardial calcification. There is atherosclerosis of the thoracic aorta, the great vessels of the mediastinum and the coronary arteries, including calcified atherosclerotic plaque in the left main, left anterior descending and left circumflex coronary arteries. Status post median sternotomy for CABG, including LIMA to the LAD.  Lungs/Pleura: Associated with the right hilar adenopathy there are extensive postobstructive changes in the right middle lobe. This includes extensive thickening of the peribronchovascular interstitium in the central aspect of the right middle lobe, and an apparent mass which is irregular in shape and therefore difficult to discretely measure, but estimated to be approximately 4.3 x 4.3 x 2.0 cm (as measured on coronal image 33 of series 5 and sagittal image 27 of series  6). This mass abuts the inferior aspect of the right major fissure. 4 mm nodule in the medial aspect of the right upper lobe (image 16 of series 3). No pleural effusions.  Upper Abdomen: 2.9 x 2.0 cm low-attenuation lesion in the right adrenal gland is very similar to prior CT scan 05/22/2009, at which point this was characterized as an adenoma. Left adrenal gland is normal in appearance.  Musculoskeletal: Median sternotomy wires. There are no aggressive appearing lytic or blastic lesions noted in the visualized portions of the skeleton.  IMPRESSION: 1. Irregular-shaped infiltrative mass in the central aspect of the right middle lobe measuring approximately 4.3 x 4.3 x 2.0 cm, with extensive right hilar, mediastinal and right supraclavicular lymphadenopathy, as detailed above. Assuming a primary non-small cell bronchogenic carcinoma, these findings are compatible with T2a, N3, Mx disease (i.e., at least stage IIIB disease). There is also  the potential that this could represent limited stage small cell carcinoma of the lung. Further evaluation with PET-CT and/or biopsy is recommended for diagnostic and staging purposes. 2. Large right adrenal gland nodule is low-attenuation and very similar to prior examination from 05/22/2009, at which point this was characterized as an adenoma. 3. Atherosclerosis, including left main and 2 vessel coronary artery disease. Please note that although the presence of coronary artery calcium documents the presence of coronary artery disease, the severity of this disease and any potential stenosis cannot be assessed on this non-gated CT examination. Assessment for potential risk factor modification, dietary therapy or pharmacologic therapy may be warranted, if clinically indicated.   Electronically Signed   By: Vinnie Langton M.D.   On: 07/09/2014 11:21   Mr Jeri Cos RS Contrast  07/17/2014   CLINICAL DATA:  Lung cancer.  Abnormal head CT.  EXAM: MRI HEAD WITHOUT AND WITH CONTRAST  TECHNIQUE: Multiplanar, multiecho pulse sequences of the brain and surrounding structures were obtained without and with intravenous contrast.  CONTRAST:  66m MULTIHANCE GADOBENATE DIMEGLUMINE 529 MG/ML IV SOLN  COMPARISON:  CT head 07/17/2014  FINDINGS: Large hemorrhagic mass in the right lateral cerebellum compatible with metastatic disease. This measures approximately 4.2 x 3.6 cm. There is edema with mass effect on the fourth ventricle which is displaced to the left. High-density on CT corresponds to significant hemorrhage within the tumor.  Hemorrhagic tumor in the left medial frontal lobe. This shows mild enhancement but no significant edema or mass effect.  Metastatic disease in the posterior sella. The sella is chronically enlarged however there is now enhancing soft tissue mass dorsally in the sella measuring 7 x 14 mm. This may be bony metastasis in the dorsum sella or may be within the infundibulum or pituitary.  Chronic  hemorrhagic infarcts in the basal ganglia bilaterally. Chronic ischemia in the pons and left cerebellum. Chronic microvascular ischemia in the cerebral white matter. No acute infarct.  IMPRESSION: Large hemorrhagic metastatic deposit in the right lateral cerebellum with edema and mass-effect on the fourth ventricle. No hydrocephalus.  Hemorrhagic metastatic deposit approximately 12 mm left medial frontal lobe without significant edema.  Metastatic disease in the dorsal sella which may be metastatic disease to the infundibulum and pituitary or possibly bony metastatic disease in the dorsum sella.  Chronic ischemic changes as above.  No acute infarct.   Electronically Signed   By: CFranchot GalloM.D.   On: 07/17/2014 21:06     Admission HPI: Ms. GKlingelis a 68year old woman with recently diagnosed R middle lobe lung mass concerning for cancer and  history of CAD s/p CABG in 01/2005, DES 85/6314, grade 1 diastolic dysfunction, DM2, HLD, HTN, Tobacco use, CVA with residual R arm and leg hemiparesis presenting with dysarthria and gait abnormality. Her family noted the gait abnormality started Wednesday with shuffling and trouble with balance. She fell this morning. No LOC or head trauma. Her speech has been slurred as well for the past 2 days.  She has occasional cough productive of green/yellow sputum. Denies fevers, chills, vision changes, shortness of breath, chest pain, nausea, vomiting, abdominal pain, diarrhea, dysuria, heamturia, rash, bruising/bleeding problems, arthralgias, myalgias, paresthesias, headache.  She was in the ED 06/26/2014 because the electricity at her apartment had been cut off. She was noted to be hypoxic with O2 sat 88% on room air. CXR demonstrated new right hilar and perihilar opacities. She left AMA from the ED but later had a CT chest with contrast on 07/09/2014 which showed irregular-shaped infiltrative mass in the central aspect of the right middle lobe measuring approximately 4.3  x 4.3 x 2.0 cm, with extensive right hilar, mediastinal and right supraclavicular lymphadenopathy.   Hospital Course by problem list: Principal Problem:   Brain metastasis Active Problems:   Diabetes mellitus type 2 in obese   HLD (hyperlipidemia)   Essential hypertension   Coronary atherosclerosis   Tobacco use   Mass of middle lobe of right lung   Metastatic cancer to brain   Acute delirium   Depression   Mood disorder due to a general medical condition   Dysarthria   Metastatic lung cancer with brain metastases: Pt with h/o tobacco abuse, and on ED visit 10/9 which was initially for social reasons, she was found to have a cough and hypoxia. A CXR showed new right hilar and perihilar opacities concerning for adenopathy or mass. CT chest showed a large irregular shaped infiltrative mass of the right middle lobe measuring 4.3x4.3x2.0cm with extensive right hilar, mediastinal and right supraclavicular lymphadenopathy, presumed to be non-small cell bronchogenic carcinoma, compatible with at least stage IIIB disease. Pulmonology was consulted and recommended an IR guided biopsy. The case was discussed with the patient's daughter by her PCP, and the decision was made for the patient to move to [place] with her daughter and have the biopsy and workup done there. This admission she presented with increased dysarthria and gait disturbance resulting in frequent falls for the past 1 week. She presented to the ED where CT head showed 3.7 x 3.9 cm heterogeneous hyperdense right cerebellar intra-axial mass with surrounding edema and adjacent mass effect. Neurosurgery was consulted and recommended MRI to further categorize and stage the mass. Decadron was also administered in the setting of edema. MRI brain demonstrates large hemorrhagic metastatic deposit in the right lateral cerebellum with edema and mass-effect on the fourth ventricle. Hemorrhagic metastatic deposit approximately 12 mm left medial  frontal lobe without significant edema. Metastatic disease in the dorsal sella which may be metastatic disease to the infundibulum and pituitary or possibly bony metastatic disease in the dorsum sella. Decadron 67m Q6hr started and tapered down to 448mq6hr which she was discharged home with. Psychiatry saw patient and deemed pt not to have capacity to make medical decisions. Her daughter RuGerda Dissas been involved in her care during this admission. Neurosurgery saw patient and did not feel comfortable proceeding with removal or rt cerebellar mass as this was against patient's wishes. Radiation oncology met with patient and [place]. CiCena Bentonn 07/30/14  at 12:00pm  DM2: A1c 7.6 on 05/21/14. She is on Metformin 552m BID at home, which was held on admission. Pt's glucose was difficult to control in the setting of steroid use. She was discharged home on lantus 25units qhs and novolog 7 units TID WC.   Discharge Vitals:   BP 127/67 mmHg  Pulse 101  Temp(Src) 98.4 F (36.9 C) (Oral)  Resp 18  Ht _0  (1.676 m)  Wt 158 lb 11.7 oz (72 kg)  BMI 25.63 kg/m2  SpO2 97%  Discharge Labs:  Results for orders placed or performed during the hospital encounter of 07/17/14 (from the past 24 hour(s))  Glucose, capillary     Status: Abnormal   Collection Time: 07/22/14 10:35 PM  Result Value Ref Range   Glucose-Capillary 265 (H) 70 - 99 mg/dL  Glucose, capillary     Status: Abnormal   Collection Time: 07/23/14  7:32 AM  Result Value Ref Range   Glucose-Capillary 275 (H) 70 - 99 mg/dL  Glucose, capillary     Status: Abnormal   Collection Time: 07/23/14 11:56 AM  Result Value Ref Range   Glucose-Capillary 455 (H) 70 - 99 mg/dL  Glucose, random     Status: Abnormal   Collection Time: 07/23/14 12:14 PM  Result Value Ref Range   Glucose, Bld 455 (H) 70 - 99 mg/dL  Glucose, capillary     Status: Abnormal   Collection Time: 07/23/14  4:05 PM  Result  Value Ref Range   Glucose-Capillary 241 (H) 70 - 99 mg/dL    Signed: DJulious Oka MD 07/23/2014, 5:20 PM    Services Ordered on Discharge: none Equipment Ordered on Discharge: rolling walker, and bedside commode

## 2014-07-23 NOTE — Progress Notes (Signed)
Subjective: Overnight tele negative. Pt had a fever of 100.6deg F last night but denies dysuria, cough, diarrhea, and neck pain. Requesting to go home. Knows that she will be moving to [place] with her daughter.    Objective: Vital signs in last 24 hours: Filed Vitals:   07/22/14 1702 07/22/14 2231 07/23/14 0139 07/23/14 0530  BP: 157/87 138/63 152/79 146/85  Pulse: 100 96 89 95  Temp: 97.8 F (36.6 C) 100.6 F (38.1 C) 99.2 F (37.3 C) 98.8 F (37.1 C)  TempSrc: Oral Oral Oral Oral  Resp: '18 18 18 18  ' Height:      Weight:      SpO2: 100% 100% 96% 100%   PE General: NAD, dysarthric speech but clear and intelligible HEENT: moist mucous membranes Lungs: CTAB Cardiac: RRR, no murmurs GI: active bowel sounds GU: negative for CVA tenderness MSK: full ROM neck, non tender to palpation of spine including cervical spine.  Neuro: CN2-12 grossly intact  Lab Results: Basic Metabolic Panel:  Recent Labs Lab 07/17/14 1433 07/18/14 0735  NA 139 145  K 4.3 4.4  CL 101 108  CO2 25 26  GLUCOSE 304* 181*  BUN 11 11  CREATININE 0.92 0.80  CALCIUM 9.5 9.9   Liver Function Tests:  Recent Labs Lab 07/17/14 1433  AST 19  ALT 17  ALKPHOS 129*  BILITOT 0.3  PROT 7.7  ALBUMIN 3.3*   CBC:  Recent Labs Lab 07/17/14 1433 07/18/14 0735  WBC 10.8* 11.5*  NEUTROABS 7.6  --   HGB 11.5* 12.7  HCT 34.9* 39.2  MCV 94.3 96.1  PLT 325 370   CBG:  Recent Labs Lab 07/21/14 2143 07/22/14 0636 07/22/14 1125 07/22/14 1659 07/22/14 2235 07/23/14 0732  GLUCAP 468* 378* 475* 233* 265* 275*   Coagulation:  Recent Labs Lab 07/17/14 1433  LABPROT 15.2  INR 1.18   Medications: I have reviewed the patient's current medications. Scheduled Meds: . dexamethasone  4 mg Intravenous 4 times per day  . insulin aspart  0-20 Units Subcutaneous TID WC  . insulin glargine  18 Units Subcutaneous QHS  . insulin starter kit- pen needles  1 kit Other Once  . living well with  diabetes book   Does not apply Once  . nicotine  14 mg Transdermal Daily  . rosuvastatin  10 mg Oral QPM  . sodium chloride  3 mL Intravenous Q12H   Continuous Infusions:  PRN Meds:. Assessment/Plan: Principal Problem:   Brain metastasis Active Problems:   Diabetes mellitus type 2 in obese   HLD (hyperlipidemia)   Essential hypertension   Coronary atherosclerosis   Tobacco use   Mass of middle lobe of right lung   Metastatic cancer to brain   Acute delirium   Depression   Mood disorder due to a general medical condition   Dysarthria  Metastatic lung cancer with brain metastases: Diagnosed- 06/26/2014, on chest CT- presumed to be non-small cell bronchogenic carcinoma, compatible with at least stage IIIB disease With MRi findings of evidence of mets and mass effect. Pt seen by psych and determined that she does not have capacity for medical decision making. However upon further discussion w/ daughter and patient it was decided to proceed w/ rad/onc consult vs neurosx (which was against pt's wishes) and to ultimately have patient live with daughter in [place].  -Decadron 58m Q6h will be continued on d/c - radiation oncology saw patient yesterday evening and will place rad/onc referral for pt in [place] - pt's  daughter given CD of brain and chest imaging to bring with her to [place] - diabetes coordinator met w/ pt and gave her an insulin starter kit. Will discuss w/ pt and daughter outpatient insulin regimen.  - appt made w/ Dr. Cena Benton at [facility] on 07/30/14 at 12:00pm - will fax pt's information on d/c to [facility] at [phone]  DM2: A1c 7.6 on 05/21/14. She is on Metformin 551m BID at home, which was held on admission.  -SSI resistant 2/2 to steroid use - lantus 18 units qhs  HTN: Pt on Toprol XL 244mdaily and lisinopril 1078maily at home. Mildy elevated overnight. - holding home meds, will consider restarting home meds if BP remains elevated  HLD:  -Crestor 97m58mUrinary  incontinence: Evaluated by urology 01/2013. Mixed stress and urge incontinence/overactive bladder and a neurogenic bladder with a functional component. She is on fesoterodine 8mg 41mly  -Home fesoterodine 8mg d70my held   FEN:  -dysphagia 3, thin liquids diet   DVT prophylaxis: SCDs  Dispo: Disposition deferred until consult w/ rad/onc   The patient does have a current PCP (ElizaBartholomew [facility].  The patient does not have transportation limitations that hinder transportation to clinic appointments.  .Services Needed at time of discharge: Y = Yes, Blank = No PT: SNF  OT: SNF  RN:   Equipment: 3 in 1 bedside comode, hospital bed  Other:     LOS: 6 days   Charlei Ramsaran Julious Oka1/01/2014, 9:07 AM

## 2014-07-23 NOTE — Progress Notes (Signed)
Inpatient Diabetes Program Recommendations  AACE/ADA: New Consensus Statement on Inpatient Glycemic Control (2013)  Target Ranges:  Prepandial:   less than 140 mg/dL      Peak postprandial:   less than 180 mg/dL (1-2 hours)      Critically ill patients:  140 - 180 mg/dL  Results for ELZA, VARRICCHIO (MRN [id number]) as of 07/23/2014 13:24  Ref. Range 07/22/2014 11:25 07/22/2014 16:59 07/22/2014 22:35 07/23/2014 07:32 07/23/2014 11:56  Glucose-Capillary Latest Range: 70-99 mg/dL 475 (H) 233 (H) 265 (H) 275 (H) 455 (H)   Inpatient Diabetes Program Recommendations Insulin - Basal: increase Lantus to 25 units  Correction (SSI): add HS scale coverage per Glycemic Control Order-set Insulin - Meal Coverage: Please consider ordering Novolog 5 units TID with meals if patient eats at least 50% of meal.  Note: consider the above recommendations while on steroid therapy. Will follow. Thank you  Raoul Pitch BSN, RN,CDE Inpatient Diabetes Coordinator [phone] (team pager)

## 2014-07-23 NOTE — Progress Notes (Signed)
Pt's daughter is in pt's room. Will review diabetic teaching with daughter. Paging MD regarding discharge instructions.

## 2014-07-23 NOTE — Telephone Encounter (Signed)
   Reason for call:   I received a call from Ms. Hadley Pen, daughter of Ms. Abelino Derrick at 9:20 PM indicating that the pharmacy filled her mother's Novolog and Lantus with vials instead of pens.  She did not realize this until she got home and opened the bag.  She was taught to use the pens in the hospital and she has no way syringes in order to use the vials.   Pertinent Data:   Review of EPIC shows Dr. Hulen Luster prescribed Novolog FlexPen and Lantus Solostar Pen to patient's pharmacy.  I asked Ms. Rivers to check her mother's CBG while I was on the phone.  CBG is 485.  Her mother ate prior to leaving hospital.  She has not taken metformin this evening.     Assessment / Plan / Recommendations:   I told Ms. Rivers I would call the pharmacy.  Pharmacy employee informed me that the e-rx is showing up as vials on their end.  They checked to see that insurance would cover pens and went ahead and filled pens and pen needles.  They are unable to deliver to patient's home but will have rx ready tonight if someone can pick up.  Pharmacy closes at [place].  I called Ms. Rivers back and informed her that rx would be available.  I apologized for the inconvenience which appears to be a technical issue as Dr. Caron Presume rx were correct (for pens) but the pharmacy filled vials.  She has no transportation to the pharmacy.  She will try calling her mother's aide and asking for a ride to the pharmacy.  She will give her mom metformin, which she has not yet taken tonight.  She was satisfied with the plan and I advised her to call back tonight if she was still unable to get rx.  As always, pt is advised that if symptoms worsen or new symptoms arise, they should go to an urgent care facility or to to ER for further evaluation.   Francesca Oman, DO   07/23/2014, 10:00 PM

## 2014-07-23 NOTE — Progress Notes (Signed)
Pt's CBG was 455. MD paged regarding this result per order set. Verbal order received to give 26 units of insulin now.
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
## 2014-07-23 NOTE — Progress Notes (Signed)
Speech Language Pathology Treatment: Dysphagia  Patient Details Name: Vanessa Calderon MRN: [id number] DOB: [birth year] Today's Date: 07/23/2014 Time: 4098-1191 SLP Time Calculation (min): 20 min  Assessment / Plan / Recommendation Clinical Impression  Skilled treatment focused on current diet tolerance with advanced trials of thin liquids. Pt with temperature spike noted overnight per chart review, with audible congestion that pt could not clear despite Max cues from SLP. Pt consumed cup sips of nectar thick liquids with no overt signs of aspiration detected. Cup sips of thin liquids yielded oral holding with intermittent, immediate coughing despite Mod multimodal cueing from SLP for compensatory strategies.   Pt's daughter and friend/caregiver arrived upon completion of the session and were educated regarding recommendations upon d/c home, including the rationale for thickened liquids and the risks for aspiration with thin liquids at this time due in large part to oral holding and impulsivity. Education was also provided regarding how to use thickener appropriately.    HPI HPI: Patient is a 68 year old female who was recently diagnosed with lung cancer last week, and was brought to the emergency department with dysarthria and balance difficulty. Workup with CT scan showed a large right cerebellar mass concerning for metastatic disease.   Pertinent Vitals Pain Assessment: No/denies pain  SLP Plan  Continue with current plan of care    Recommendations Diet recommendations: Dysphagia 3 (mechanical soft);Nectar-thick liquid Liquids provided via: Cup;No straw Medication Administration: Whole meds with puree Supervision: Patient able to self feed;Intermittent supervision to cue for compensatory strategies Compensations: Slow rate;Small sips/bites Postural Changes and/or Swallow Maneuvers: Seated upright 90 degrees              Oral Care Recommendations: Oral care BID Follow up  Recommendations: Home health SLP Plan: Continue with current plan of care    GO      Vanessa Calderon, M.A. CCC-SLP [phone]  Vanessa Calderon 07/23/2014, 3:41 PM

## 2014-07-24 ENCOUNTER — Other Ambulatory Visit: Payer: Self-pay | Admitting: Internal Medicine

## 2014-07-24 MED ORDER — DEXAMETHASONE 4 MG PO TABS: 4.0000 mg | ORAL_TABLET | Freq: Four times a day (QID) | ORAL | Status: AC

## 2014-07-24 NOTE — Progress Notes (Signed)
Received call from nursing stating that family member is requesting a walker; Noted DME ordered at discharge, CM was not aware of DC needs; Huey Romans / insurance provider for DME contacted, orders faxed and DME will be delivered to the patient's home today; Aneta Mins [phone]

## 2014-07-27 ENCOUNTER — Telehealth: Payer: Self-pay | Admitting: *Deleted

## 2014-07-27 NOTE — Addendum Note (Signed)
Addended by: Hulan Fray on: 07/27/2014 03:54 PM   Modules accepted: Orders

## 2014-07-27 NOTE — Telephone Encounter (Signed)
Called patient to inform of consultation with Dr. Dede Query on Nov. 17- arrival time - 11 am - address - [address] [place] , [place]. [address], phone number - [phone], lvm for a return call

## 2014-07-30 ENCOUNTER — Telehealth: Payer: Self-pay | Admitting: *Deleted

## 2014-07-30 NOTE — Telephone Encounter (Signed)
Called patient's daughter, Henry Russel to inform of appt. With Dr. Duke Salvia in [place], lvm for a return call

## 2014-08-06 ENCOUNTER — Other Ambulatory Visit: Payer: Self-pay | Admitting: Internal Medicine

## 2014-08-06 NOTE — Telephone Encounter (Signed)
Pt has transferred care to [facility]. In hospital there as of yesterday. Any meds now need to come from new MD.

## 2015-01-17 DEATH — deceased

## 2021-07-19 ENCOUNTER — Other Ambulatory Visit (HOSPITAL_BASED_OUTPATIENT_CLINIC_OR_DEPARTMENT_OTHER): Payer: Self-pay
